# Patient Record
Sex: Male | Born: 1945 | Race: White | Hispanic: No | Marital: Married | Smoking: Light tobacco smoker
Health system: Southern US, Community
[De-identification: ages and names within clinical notes are randomized; demographics above are authoritative.]

## PROBLEM LIST (undated history)

## (undated) DIAGNOSIS — N200 Calculus of kidney: Secondary | ICD-10-CM

## (undated) DIAGNOSIS — I10 Essential (primary) hypertension: Secondary | ICD-10-CM

## (undated) DIAGNOSIS — E785 Hyperlipidemia, unspecified: Secondary | ICD-10-CM

## (undated) DIAGNOSIS — Z52000 Unspecified donor, whole blood: Secondary | ICD-10-CM

## (undated) DIAGNOSIS — N410 Acute prostatitis: Secondary | ICD-10-CM

## (undated) DIAGNOSIS — Z860100 Personal history of colon polyps, unspecified: Secondary | ICD-10-CM

## (undated) DIAGNOSIS — S83006A Unspecified dislocation of unspecified patella, initial encounter: Secondary | ICD-10-CM

## (undated) DIAGNOSIS — M1990 Unspecified osteoarthritis, unspecified site: Secondary | ICD-10-CM

## (undated) DIAGNOSIS — Z85828 Personal history of other malignant neoplasm of skin: Secondary | ICD-10-CM

## (undated) DIAGNOSIS — K649 Unspecified hemorrhoids: Secondary | ICD-10-CM

## (undated) DIAGNOSIS — F17200 Nicotine dependence, unspecified, uncomplicated: Secondary | ICD-10-CM

## (undated) DIAGNOSIS — C801 Malignant (primary) neoplasm, unspecified: Secondary | ICD-10-CM

## (undated) HISTORY — DX: Unspecified hemorrhoids: K64.9

## (undated) HISTORY — PX: POLYPECTOMY: SHX149

## (undated) HISTORY — DX: Personal history of colonic polyps: Z86.010

## (undated) HISTORY — DX: Essential (primary) hypertension: I10

## (undated) HISTORY — DX: Nicotine dependence, unspecified, uncomplicated: F17.200

## (undated) HISTORY — PX: MOHS SURGERY: SUR867

## (undated) HISTORY — PX: EYE SURGERY: SHX253

## (undated) HISTORY — PX: COLONOSCOPY: SHX174

## (undated) HISTORY — DX: Hyperlipidemia, unspecified: E78.5

## (undated) HISTORY — DX: Personal history of other malignant neoplasm of skin: Z85.828

## (undated) HISTORY — DX: Acute prostatitis: N41.0

## (undated) HISTORY — DX: Personal history of colon polyps, unspecified: Z86.0100

## (undated) HISTORY — DX: Unspecified osteoarthritis, unspecified site: M19.90

## (undated) HISTORY — PX: TONSILLECTOMY AND ADENOIDECTOMY: SUR1326

## (undated) HISTORY — DX: Unspecified donor, whole blood: Z52.000

---

## 2005-05-03 ENCOUNTER — Ambulatory Visit: Payer: Self-pay | Admitting: Pulmonary Disease

## 2005-05-09 ENCOUNTER — Ambulatory Visit: Payer: Self-pay | Admitting: Pulmonary Disease

## 2006-06-20 ENCOUNTER — Ambulatory Visit: Payer: Self-pay | Admitting: Pulmonary Disease

## 2006-07-13 ENCOUNTER — Ambulatory Visit: Payer: Self-pay | Admitting: Internal Medicine

## 2006-07-27 ENCOUNTER — Encounter (INDEPENDENT_AMBULATORY_CARE_PROVIDER_SITE_OTHER): Payer: Self-pay | Admitting: *Deleted

## 2006-07-27 ENCOUNTER — Ambulatory Visit: Payer: Self-pay | Admitting: Internal Medicine

## 2007-03-20 HISTORY — PX: OTHER SURGICAL HISTORY: SHX169

## 2008-02-20 ENCOUNTER — Telehealth (INDEPENDENT_AMBULATORY_CARE_PROVIDER_SITE_OTHER): Payer: Self-pay | Admitting: *Deleted

## 2008-05-28 ENCOUNTER — Ambulatory Visit: Payer: Self-pay | Admitting: Pulmonary Disease

## 2008-06-07 LAB — CONVERTED CEMR LAB
Albumin: 4 g/dL (ref 3.5–5.2)
BUN: 14 mg/dL (ref 6–23)
Basophils Absolute: 0 10*3/uL (ref 0.0–0.1)
Bilirubin, Direct: 0.1 mg/dL (ref 0.0–0.3)
Calcium: 9.6 mg/dL (ref 8.4–10.5)
Cholesterol: 216 mg/dL (ref 0–200)
Crystals: NEGATIVE
Eosinophils Absolute: 0.3 10*3/uL (ref 0.0–0.7)
GFR calc Af Amer: 111 mL/min
GFR calc non Af Amer: 91 mL/min
Glucose, Bld: 109 mg/dL — ABNORMAL HIGH (ref 70–99)
HCT: 45.7 % (ref 39.0–52.0)
HDL: 53 mg/dL (ref >39.0)
Hemoglobin, Urine: NEGATIVE
Leukocytes, UA: NEGATIVE
MCHC: 34.3 g/dL (ref 30.0–36.0)
MCV: 98 fL (ref 78.0–100.0)
Monocytes Absolute: 0.5 10*3/uL (ref 0.1–1.0)
Neutrophils Relative %: 54.6 % (ref 43.0–77.0)
PSA: 1.16 ng/mL (ref 0.10–4.00)
Platelets: 226 10*3/uL (ref 150–400)
Potassium: 4.6 meq/L (ref 3.5–5.1)
RBC / HPF: NONE SEEN
RDW: 12.1 % (ref 11.5–14.6)
Sodium: 142 meq/L (ref 135–145)
Specific Gravity, Urine: 1.02 (ref 1.000–1.03)
TSH: 1.13 microintl units/mL (ref 0.35–5.50)
Total Protein: 6.9 g/dL (ref 6.0–8.3)
Triglycerides: 43 mg/dL (ref 0–149)
Urine Glucose: NEGATIVE mg/dL
Urobilinogen, UA: 0.2 (ref 0.0–1.0)
WBC, UA: NONE SEEN cells/hpf
WBC: 6.7 10*3/uL (ref 4.5–10.5)

## 2008-06-19 HISTORY — PX: OTHER SURGICAL HISTORY: SHX169

## 2008-06-28 DIAGNOSIS — C439 Malignant melanoma of skin, unspecified: Secondary | ICD-10-CM | POA: Insufficient documentation

## 2008-06-28 DIAGNOSIS — K649 Unspecified hemorrhoids: Secondary | ICD-10-CM | POA: Insufficient documentation

## 2008-06-28 DIAGNOSIS — E7800 Pure hypercholesterolemia, unspecified: Secondary | ICD-10-CM | POA: Insufficient documentation

## 2008-06-28 DIAGNOSIS — M1990 Unspecified osteoarthritis, unspecified site: Secondary | ICD-10-CM | POA: Insufficient documentation

## 2008-06-28 DIAGNOSIS — F17200 Nicotine dependence, unspecified, uncomplicated: Secondary | ICD-10-CM | POA: Insufficient documentation

## 2008-06-28 DIAGNOSIS — D126 Benign neoplasm of colon, unspecified: Secondary | ICD-10-CM | POA: Insufficient documentation

## 2008-07-16 ENCOUNTER — Encounter: Payer: Self-pay | Admitting: Pulmonary Disease

## 2008-08-24 ENCOUNTER — Encounter: Payer: Self-pay | Admitting: Pulmonary Disease

## 2009-05-31 ENCOUNTER — Ambulatory Visit: Payer: Self-pay | Admitting: Pulmonary Disease

## 2009-06-04 LAB — CONVERTED CEMR LAB
ALT: 22 units/L (ref 0–53)
BUN: 14 mg/dL (ref 6–23)
Basophils Absolute: 0.1 10*3/uL (ref 0.0–0.1)
Chloride: 106 meq/L (ref 96–112)
Cholesterol: 222 mg/dL — ABNORMAL HIGH (ref 0–200)
Creatinine, Ser: 0.8 mg/dL (ref 0.4–1.5)
Eosinophils Absolute: 0.3 10*3/uL (ref 0.0–0.7)
Eosinophils Relative: 4 % (ref 0.0–5.0)
Glucose, Bld: 100 mg/dL — ABNORMAL HIGH (ref 70–99)
HCT: 48.8 % (ref 39.0–52.0)
Ketones, ur: NEGATIVE mg/dL
Leukocytes, UA: NEGATIVE
Lymphs Abs: 2 10*3/uL (ref 0.7–4.0)
MCHC: 34.1 g/dL (ref 30.0–36.0)
MCV: 99.6 fL (ref 78.0–100.0)
Monocytes Absolute: 0.4 10*3/uL (ref 0.1–1.0)
Nitrite: NEGATIVE
PSA: 1.26 ng/mL (ref 0.10–4.00)
Platelets: 214 10*3/uL (ref 150.0–400.0)
Potassium: 4.5 meq/L (ref 3.5–5.1)
RDW: 11.9 % (ref 11.5–14.6)
Specific Gravity, Urine: 1.02 (ref 1.000–1.030)
TSH: 1.33 microintl units/mL (ref 0.35–5.50)
Total Bilirubin: 1.3 mg/dL — ABNORMAL HIGH (ref 0.3–1.2)
Urobilinogen, UA: 0.2 (ref 0.0–1.0)

## 2009-06-09 ENCOUNTER — Telehealth: Payer: Self-pay | Admitting: Pulmonary Disease

## 2009-09-08 ENCOUNTER — Telehealth: Payer: Self-pay | Admitting: Pulmonary Disease

## 2009-09-09 ENCOUNTER — Ambulatory Visit: Payer: Self-pay | Admitting: Pulmonary Disease

## 2009-09-19 LAB — CONVERTED CEMR LAB
Albumin: 3.8 g/dL (ref 3.5–5.2)
Bilirubin, Direct: 0.1 mg/dL (ref 0.0–0.3)
Cholesterol: 141 mg/dL (ref 0–200)
LDL Cholesterol: 75 mg/dL (ref 0–99)
Total Protein: 6.6 g/dL (ref 6.0–8.3)
Triglycerides: 36 mg/dL (ref 0.0–149.0)
VLDL: 7.2 mg/dL (ref 0.0–40.0)

## 2009-09-27 ENCOUNTER — Telehealth: Payer: Self-pay | Admitting: Pulmonary Disease

## 2010-07-19 NOTE — Progress Notes (Signed)
Summary: RESULTS  Phone Note Call from Patient   Caller: Patient Call For: Annaleigh Steinmeyer Summary of Call: CALLING FOR LAB RESULTS NEED CHOLESTEROL READING LEAVE MASSAGE IF NO ANSWER Initial call taken by: Rickard Patience,  September 27, 2009 9:44 AM  Follow-up for Phone Call        Looks like FLP were good and SN wants pt to stay on current dose of Simva. LMOMTCB. Per Leigh, will forward msg to her box.Michel Bickers [facility]  September 27, 2009 10:02 AM   per pt--ok to leave message on machine if no answer---per SN---flp looks good on the simvastatin 40mg   daily---keep the same---lmom to make pt aware of lab results and SN recs.  explained to call for any questions. Randell Loop CMA  September 27, 2009 2:22 PM

## 2010-07-19 NOTE — Progress Notes (Signed)
Summary: labs  Phone Note Call from Patient Call back at [phone]   Caller: Patient Call For: nadel Summary of Call: need labs put in to check cholestrol Initial call taken by: Rickard Patience,  September 08, 2009 11:18 AM  Follow-up for Phone Call        Pt last had labs done in Dec 2010.  Was started on simvastatin.  Please advise if SN needs pt to come in for labs. Thanks  Follow-up by: Vernie Murders,  September 08, 2009 11:33 AM  Additional Follow-up for Phone Call Additional follow up Details #1::        called and spoke with pt and he will come to the lab in the am for recheck chol labs after starting on simvastatin 40mg  daily. Randell Loop CMA  September 08, 2009 11:39 AM

## 2010-08-17 ENCOUNTER — Telehealth (INDEPENDENT_AMBULATORY_CARE_PROVIDER_SITE_OTHER): Payer: Self-pay | Admitting: *Deleted

## 2010-08-22 ENCOUNTER — Encounter: Payer: Self-pay | Admitting: Pulmonary Disease

## 2010-08-22 ENCOUNTER — Ambulatory Visit (INDEPENDENT_AMBULATORY_CARE_PROVIDER_SITE_OTHER)
Admission: RE | Admit: 2010-08-22 | Discharge: 2010-08-22 | Disposition: A | Discharge status: Home / Self Care | Payer: BC Managed Care – PPO | Source: Ambulatory Visit | Attending: Pulmonary Disease | Admitting: Pulmonary Disease

## 2010-08-22 ENCOUNTER — Encounter (INDEPENDENT_AMBULATORY_CARE_PROVIDER_SITE_OTHER): Payer: BC Managed Care – PPO | Admitting: Pulmonary Disease

## 2010-08-22 ENCOUNTER — Other Ambulatory Visit: Payer: Self-pay | Admitting: Pulmonary Disease

## 2010-08-22 ENCOUNTER — Other Ambulatory Visit: Payer: BC Managed Care – PPO

## 2010-08-22 DIAGNOSIS — Z0000 Encounter for general adult medical examination without abnormal findings: Secondary | ICD-10-CM

## 2010-08-22 LAB — CBC WITH DIFFERENTIAL/PLATELET
Basophils Absolute: 0 10*3/uL (ref 0.0–0.1)
Eosinophils Relative: 4.3 % (ref 0.0–5.0)
HCT: 46.3 % (ref 39.0–52.0)
Hemoglobin: 16.1 g/dL (ref 13.0–17.0)
Lymphocytes Relative: 32.9 % (ref 12.0–46.0)
Lymphs Abs: 2.4 10*3/uL (ref 0.7–4.0)
Monocytes Relative: 6.4 % (ref 3.0–12.0)
Neutro Abs: 4.1 10*3/uL (ref 1.4–7.7)
WBC: 7.4 10*3/uL (ref 4.5–10.5)

## 2010-08-22 LAB — URINALYSIS, ROUTINE W REFLEX MICROSCOPIC
Bilirubin Urine: NEGATIVE
Ketones, ur: NEGATIVE
Leukocytes, UA: NEGATIVE
Specific Gravity, Urine: 1.02 (ref 1.000–1.030)
Urine Glucose: NEGATIVE
pH: 6 (ref 5.0–8.0)

## 2010-08-22 LAB — PSA: PSA: 1.23 ng/mL (ref 0.10–4.00)

## 2010-08-22 LAB — LIPID PANEL
Cholesterol: 157 mg/dL (ref 0–200)
HDL: 54.2 mg/dL (ref >39.00)
LDL Cholesterol: 92 mg/dL (ref 0–99)
VLDL: 10.8 mg/dL (ref 0.0–40.0)

## 2010-08-22 LAB — BASIC METABOLIC PANEL
Chloride: 103 mEq/L (ref 96–112)
Potassium: 4.4 mEq/L (ref 3.5–5.1)

## 2010-08-22 LAB — TSH: TSH: 0.81 u[IU]/mL (ref 0.35–5.50)

## 2010-08-25 NOTE — Progress Notes (Signed)
Summary: simvastatin rx sent but keep appt with SN  Phone Note Call from Patient   Caller: Patient Call For: NADEL Summary of Call: Patient phoned stated that he called and requested a refill for his Simvastain and it was denied due needing an appt. I scheduled Mr.  Taylors physical for 08/22/10 at 11:00. he is completely out and would like to know if he can have a prescription called in to last until his appt. Patient uses [facility] and patient can be reached at work [phone] Initial call taken by: Vedia Coffer,  August 17, 2010 10:54 AM  Follow-up for Phone Call        ATC pt at given number to let him know to KEEP appt and I have sent 1 refill for Simvastatin Rx.Reynaldo Minium CMA  August 17, 2010 12:19 PM   Called, spoke with pt.  He was informed 1 rx sent to pharmacy but he needs to keep scheduled appt on 08/22/10 at 11am with SN for additional rxs.  He verbalized understanding of this. Follow-up by: Gweneth Dimitri RN,  August 17, 2010 1:39 PM    Prescriptions: SIMVASTATIN 40 MG TABS (SIMVASTATIN) Take 1 tablet by mouth once a day  #30 Tablet x 0   Entered by:   Reynaldo Minium CMA   Authorized by:   Michele Mcalpine MD   Signed by:   Reynaldo Minium CMA on 08/17/2010   Method used:   Electronically to        [facility] (retail)       [address]       [place], [place]  [id number]       Ph: [phone] or [phone]       Fax: [phone]   RxID:   [id number]

## 2010-09-06 NOTE — Assessment & Plan Note (Signed)
Summary: CPX//SH   CC:  14 month ROV & CPX....  History of Present Illness: 65 y/o WM here for a follow up visit...    ~  May 31, 2009:  yearly physical- doing well, no complaints... 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   ~  August 22, 2010:  Yearly follow up- doing well overall notes some left knee pain (he works out in gym 5d/wk +golf etc & uses Advil Prn), occas nasal drainage & epistaxis (wife made appt w/ ENT later this week- we discussed saline)...    On Simva40 for Chol & FLP 3/12 shows TChol 157, TG 54, HDL 54, LDL 92> continue same +diet etc...    EKG= SBrady 54/min, WNL.Marland KitchenMarland Kitchen CPX labs all look good (he didn't go for his XRay today)...    Current Problems:   PHYSICAL EXAMINATION (ICD-V70.0) - he takes ASA 81mg  on MWF since he has epistaxis on daily ASA early in 2009... he had a tetanus shot here in 2006.  SMOKER (ICD-305.1) - he is a cigar smoker and states that he does not inhale... smokers only occas "when I play golf or go to the bar"...  HYPERCHOLESTEROLEMIA, BORDERLINE (ICD-272.4) - on diet Rx alone & prev not interested in med Rx... now he states he will consider medication if no better on his diet...  ~  FLP 11/06 showed TChol 210, Tg 41, HDL 59, LDL 138... pt refers diet Rx.  ~  FLP 12/09 showed TChol 219, Tg 43, HDL 53, LDL 141... rec> start Simva but he refuses, prefers diet.  ~  FLP 12/10 showed TChol  222, TG 111, HDL 53, LDL 153... rec> start Simva40.  ~  FLP 3/11 on Simva40 showed TChol 141, TG 36, HDL 59, LDL 75... continue Simva40.  Hx of COLONIC POLYPS (ICD-211.3), & HEMORRHOIDS (ICD-455.6) - + FamHx colon cancer in his grandfather... last colonoscopy 2/08 by DrPerry showed 3mm polyp & hems... path= polypoid mucosa w/o adenomatous change... f/u planned 75yrs.  Hx of ACUTE  PROSTATITIS (ICD-601.0) - prev eval & Rx by DrWrenn... no symptoms...  DEGENERATIVE JOINT DISEASE (ICD-715.90) - he had a prev left knee arthroscopy in 2008... he take 3 ADVIL prior to golfing...  SKIN CANCER, HX OF (ICD-V10.83) - he sees DrHall for skin check every 6 months... 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  ~  labs 12/09 showed Hg= 15.7.Marland Kitchen.  ~  labs 12/10 showed Hg= 16.6   Preventive Screening-Counseling & Management  Alcohol-Tobacco     Smoking Status: current  Comments: smokes 1 cigar weekly  Allergies (verified): No Known Drug Allergies  Comments:  Nurse/Medical Assistant: The patient's medications and allergies were reviewed with the patient and were updated in the Medication and Allergy Lists.  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 Father died w/ COPD Mother died w/ Alzheimer's disease & stroke 1 Sibling- sister in good health Grandfather had colon cancer  Social History: Reviewed history from 05/28/2008 and no changes required. 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  The patient denies fever, chills, sweats, anorexia, fatigue, weakness, malaise, weight loss, sleep disorder, blurring, diplopia, eye irritation, eye discharge, vision loss, eye pain, photophobia,  earache, ear discharge, tinnitus, decreased hearing, nasal congestion, nosebleeds, sore throat, hoarseness, chest pain, palpitations, syncope, dyspnea on exertion, orthopnea, PND, peripheral edema, cough, dyspnea at rest, excessive sputum, hemoptysis, wheezing, pleurisy, nausea, vomiting, diarrhea, constipation, change in bowel habits, abdominal pain, melena, hematochezia, jaundice, gas/bloating, indigestion/heartburn, dysphagia, odynophagia, dysuria, hematuria, urinary frequency, urinary hesitancy, nocturia, incontinence, back pain, joint swelling, muscle cramps, muscle weakness, stiffness, sciatica, restless legs, leg pain at night, leg pain with exertion, rash, itching, dryness, suspicious lesions, paralysis, paresthesias, seizures, tremors, vertigo, transient blindness, frequent falls, frequent headaches, difficulty walking, depression, anxiety, memory loss, confusion, cold intolerance, heat intolerance, polydipsia, polyphagia, polyuria, unusual weight change, abnormal bruising, bleeding, enlarged lymph nodes, urticaria, allergic rash, hay fever, and recurrent infections.    Vital Signs:  Patient profile:   65 year old male Height:      72 inches Weight:      197 pounds BMI:     26.81 O2 Sat:      97 % on Room air Temp:     97.0 degrees F oral Pulse rate:   54 / minute BP sitting:   130 / 80  (left arm) Cuff size:   regular  O2 Sat at Rest %:  97 O2 Flow:  Room air CC: 14 month ROV & CPX... Is Patient Diabetic? No Pain Assessment Patient in pain? yes      Onset of pain  joint pain/left knee pain Comments meds updated today with pt   Physical Exam  Additional Exam:  WD, WN, 65 y/o WM in NAD...  GENERAL:  Alert & oriented; pleasant & cooperative... HEENT:  [place]/AT, EOM-wnl, PERRLA, EACs-clear, TMs-wnl, NOSE-clear, THROAT-clear & wnl. NECK:  Supple w/ full ROM; no JVD; normal carotid impulses w/o bruits; no thyromegaly or nodules palpated; no lymphadenopathy. CHEST:  Clear to P & A;  without wheezes/ rales/ or rhonchi. HEART:  Regular Rhythm; without murmurs/ rubs/ or gallops. ABDOMEN:  Soft & nontender; normal bowel sounds; no organomegaly or masses detected. RECTAL:  Neg - prostate 2+ & nontender w/o nodules; stool hematest neg. EXT: without deformities, mild arthritic changes; no varicose veins/ venous insuffic/ or edema. NEURO:  CN's intact; motor testing normal; sensory testing normal; gait normal & balance OK. DERM:  No lesions noted; no rash etc...    Impression & Recommendations:  Problem # 1:  PHYSICAL EXAMINATION (ICD-V70.0)  Orders: EKG w/ Interpretation (93000) T-2 View CXR (71020TC) TLB-BMP (Basic Metabolic Panel-BMET) (80048-METABOL) TLB-CBC Platelet - w/Differential (85025-CBCD) TLB-Lipid Panel (80061-LIPID) TLB-TSH (Thyroid Stimulating Hormone) (84443-TSH) TLB-PSA (Prostate Specific Antigen) (84153-PSA) TLB-Udip w/ Micro (81001-URINE)  Problem # 2:  SMOKER (ICD-305.1) We discussed stopping the cigars etc "I don't inhale"...  Problem # 3:  HYPERCHOLESTEROLEMIA, BORDERLINE (ICD-272.4) Much improved on the Simva40>  continue same... His updated medication list for this problem includes:    Simvastatin 40 Mg Tabs (Simvastatin) .Marland Kitchen... Take 1 tablet by mouth once a day  Problem # 4:  Hx of COLONIC POLYPS (ICD-211.3) Up to date, no symptoms, f/u  due 2/15...  Problem # 5:  DEGENERATIVE JOINT DISEASE (ICD-715.90) He uses Advil for his knee... advised brace for work outs... Ortho Prn. His updated medication list for this problem includes:    Aspirin Adult Low Strength 81 Mg Tbec (Aspirin) .Marland Kitchen... Take 1 tablet by mouth 3 times per week  Problem # 6:  OTHER MEDICAL PROBLEMS AS NOTED>>> He did not go to Radiology for his f/u CXR this yr...  Complete Medication List: 1)  Aspirin Adult Low Strength 81 Mg Tbec (Aspirin) .... Take 1 tablet by mouth 3 times per week 2)  Multivitamins Tabs (Multiple vitamin) .... Take 1 tablet by mouth once a day 3)   Simvastatin 40 Mg Tabs (Simvastatin) .... Take 1 tablet by mouth once a day  Patient Instructions: 1)  Today we updated your med list- see below.... 2)  We refilled your simvastatin for 2012... 3)  Today we did your follow up CXR EKG, & FASTING blood work... 4)  please call the "phone tree" in a few days for your lab results.Marland KitchenMarland Kitchen  5)  Keep up the great job w/ your exercise program... 6)  Call for any problems.Marland KitchenMarland Kitchen 7)  Please schedule a follow-up appointment in 1 year. Prescriptions: SIMVASTATIN 40 MG TABS (SIMVASTATIN) Take 1 tablet by mouth once a day  #90 x 4   Entered and Authorized by:   Michele Mcalpine MD   Signed by:   Michele Mcalpine MD on 08/22/2010   Method used:   Print then Give to Patient   RxID:   [id number]    Immunization History:  Influenza Immunization History:    Influenza:  historical (02/25/2010)

## 2011-01-16 ENCOUNTER — Emergency Department (HOSPITAL_COMMUNITY)
Admission: EM | Admit: 2011-01-16 | Discharge: 2011-01-16 | Disposition: A | Discharge status: Home / Self Care | Payer: BC Managed Care – PPO | Attending: Emergency Medicine | Admitting: Emergency Medicine

## 2011-01-16 ENCOUNTER — Telehealth: Payer: Self-pay | Admitting: Pulmonary Disease

## 2011-01-16 ENCOUNTER — Emergency Department (HOSPITAL_COMMUNITY): Payer: BC Managed Care – PPO

## 2011-01-16 DIAGNOSIS — E785 Hyperlipidemia, unspecified: Secondary | ICD-10-CM | POA: Insufficient documentation

## 2011-01-16 DIAGNOSIS — R109 Unspecified abdominal pain: Secondary | ICD-10-CM | POA: Insufficient documentation

## 2011-01-16 DIAGNOSIS — N201 Calculus of ureter: Secondary | ICD-10-CM | POA: Insufficient documentation

## 2011-01-16 LAB — URINALYSIS, ROUTINE W REFLEX MICROSCOPIC
Glucose, UA: NEGATIVE mg/dL
Ketones, ur: NEGATIVE mg/dL
Leukocytes, UA: NEGATIVE
Nitrite: NEGATIVE
Specific Gravity, Urine: 1.029 (ref 1.005–1.030)
pH: 5.5 (ref 5.0–8.0)

## 2011-01-16 NOTE — Telephone Encounter (Signed)
Called spoke with patient, advised of SN's recs.  Pt verbalized his understanding.

## 2011-01-16 NOTE — Telephone Encounter (Signed)
Per SN-----he will need prompt eval --we have no openings today---he will need to go to the ER for eval of the abd pain.  They can do all the xrays and labs there and treat his pain.  thanks

## 2011-01-16 NOTE — Telephone Encounter (Signed)
Called and spoke with pt.  Pt states he woke up this morning at 2am with lower R sided abd pain.  Pt states the pain is sharp and constant. States the pain doesn't radiate.  Denies any vomiting but does c/o nausea.  Also denies f/c/s.  Pt is concerned and  requesting to be seen today with SN.  Please advise.  Thanks.  NKDA

## 2011-01-16 NOTE — Telephone Encounter (Signed)
Pt also denies diarrhea or constipation.

## 2011-01-22 ENCOUNTER — Emergency Department (HOSPITAL_COMMUNITY): Payer: BC Managed Care – PPO

## 2011-01-22 ENCOUNTER — Emergency Department (HOSPITAL_COMMUNITY)
Admission: EM | Admit: 2011-01-22 | Discharge: 2011-01-22 | Disposition: A | Discharge status: Home / Self Care | Payer: BC Managed Care – PPO | Attending: Emergency Medicine | Admitting: Emergency Medicine

## 2011-01-22 DIAGNOSIS — R109 Unspecified abdominal pain: Secondary | ICD-10-CM | POA: Insufficient documentation

## 2011-01-22 DIAGNOSIS — N200 Calculus of kidney: Secondary | ICD-10-CM | POA: Insufficient documentation

## 2011-01-22 DIAGNOSIS — K5900 Constipation, unspecified: Secondary | ICD-10-CM | POA: Insufficient documentation

## 2011-01-22 LAB — POCT I-STAT, CHEM 8
Creatinine, Ser: 1.2 mg/dL (ref 0.50–1.35)
Glucose, Bld: 115 mg/dL — ABNORMAL HIGH (ref 70–99)
Hemoglobin: 15.3 g/dL (ref 13.0–17.0)
Sodium: 140 mEq/L (ref 135–145)
TCO2: 25 mmol/L (ref 0–100)

## 2011-01-22 LAB — URINALYSIS, ROUTINE W REFLEX MICROSCOPIC
Bilirubin Urine: NEGATIVE
Protein, ur: NEGATIVE mg/dL
Urobilinogen, UA: 1 mg/dL (ref 0.0–1.0)

## 2012-01-31 ENCOUNTER — Other Ambulatory Visit: Payer: Self-pay | Admitting: Pulmonary Disease

## 2012-01-31 ENCOUNTER — Telehealth: Payer: Self-pay | Admitting: Pulmonary Disease

## 2012-01-31 ENCOUNTER — Other Ambulatory Visit: Payer: Self-pay | Admitting: *Deleted

## 2012-01-31 MED ORDER — SIMVASTATIN 40 MG PO TABS: ORAL_TABLET | ORAL | Status: DC

## 2012-01-31 NOTE — Telephone Encounter (Signed)
Faxed refill request received from [facility] for Simvastatin 40 mg take one tablet by mouth once daily. Patient last seen March 2012. Refill sent in and noted that patient needs appointment for further refills.

## 2012-01-31 NOTE — Progress Notes (Signed)
Pts Rx was refilled and printed instead of faxed in. I contacted pt to ask him what pharm he used seeing as to how we had none on file for him.  Rx phoned-in to [facility].  Simvastatin 40mg  #30 x 0RF  1 po qd.  (PATIENT NEEDS APPOINTMENT FOR FURTHER REFILLS)

## 2012-01-31 NOTE — Telephone Encounter (Signed)
I spoke with pt and he stated he has already received a call from another nurse stating the rx was being taking care of and he did not need anything further

## 2012-02-21 ENCOUNTER — Telehealth: Payer: Self-pay | Admitting: Pulmonary Disease

## 2012-02-21 MED ORDER — SIMVASTATIN 40 MG PO TABS: ORAL_TABLET | ORAL | Status: DC

## 2012-02-21 NOTE — Telephone Encounter (Signed)
I spoke with the pt and he will run out of simvastatin before appt with SN on 03-19-12 so he is requesting a refill be sent to [facility]. Refill sent. Pt aware must keep appt. Carron Curie, CMA

## 2012-03-11 ENCOUNTER — Encounter: Payer: BC Managed Care – PPO | Admitting: Pulmonary Disease

## 2012-03-19 ENCOUNTER — Encounter: Payer: Self-pay | Admitting: Pulmonary Disease

## 2012-03-19 ENCOUNTER — Ambulatory Visit (INDEPENDENT_AMBULATORY_CARE_PROVIDER_SITE_OTHER)
Admission: RE | Admit: 2012-03-19 | Discharge: 2012-03-19 | Disposition: A | Discharge status: Home / Self Care | Payer: BC Managed Care – PPO | Source: Ambulatory Visit | Attending: Pulmonary Disease | Admitting: Pulmonary Disease

## 2012-03-19 ENCOUNTER — Ambulatory Visit (INDEPENDENT_AMBULATORY_CARE_PROVIDER_SITE_OTHER): Payer: BC Managed Care – PPO | Admitting: Pulmonary Disease

## 2012-03-19 ENCOUNTER — Other Ambulatory Visit (INDEPENDENT_AMBULATORY_CARE_PROVIDER_SITE_OTHER): Payer: BC Managed Care – PPO

## 2012-03-19 VITALS — BP 160/88 | HR 52 | Temp 97.0°F | Ht 72.0 in | Wt 187.2 lb

## 2012-03-19 DIAGNOSIS — M1990 Unspecified osteoarthritis, unspecified site: Secondary | ICD-10-CM

## 2012-03-19 DIAGNOSIS — E785 Hyperlipidemia, unspecified: Secondary | ICD-10-CM

## 2012-03-19 DIAGNOSIS — Z85828 Personal history of other malignant neoplasm of skin: Secondary | ICD-10-CM

## 2012-03-19 DIAGNOSIS — D126 Benign neoplasm of colon, unspecified: Secondary | ICD-10-CM

## 2012-03-19 DIAGNOSIS — N200 Calculus of kidney: Secondary | ICD-10-CM

## 2012-03-19 DIAGNOSIS — Z0000 Encounter for general adult medical examination without abnormal findings: Secondary | ICD-10-CM

## 2012-03-19 LAB — CBC WITH DIFFERENTIAL/PLATELET
Basophils Absolute: 0 10*3/uL (ref 0.0–0.1)
HCT: 45.8 % (ref 39.0–52.0)
Hemoglobin: 15 g/dL (ref 13.0–17.0)
Lymphs Abs: 2.4 10*3/uL (ref 0.7–4.0)
MCHC: 32.8 g/dL (ref 30.0–36.0)
Monocytes Relative: 6.1 % (ref 3.0–12.0)
Neutro Abs: 4.4 10*3/uL (ref 1.4–7.7)
RDW: 13.3 % (ref 11.5–14.6)

## 2012-03-19 LAB — URINALYSIS
Ketones, ur: NEGATIVE
Specific Gravity, Urine: >=1.03 (ref 1.000–1.030)
Total Protein, Urine: NEGATIVE
Urine Glucose: NEGATIVE
Urobilinogen, UA: 0.2 (ref 0.0–1.0)

## 2012-03-19 LAB — BASIC METABOLIC PANEL
Calcium: 9.4 mg/dL (ref 8.4–10.5)
GFR: 122.65 mL/min (ref >60.00)
Sodium: 139 mEq/L (ref 135–145)

## 2012-03-19 LAB — HEPATIC FUNCTION PANEL
Albumin: 4.1 g/dL (ref 3.5–5.2)
Alkaline Phosphatase: 65 U/L (ref 39–117)
Bilirubin, Direct: 0.2 mg/dL (ref 0.0–0.3)

## 2012-03-19 LAB — LIPID PANEL
HDL: 73.1 mg/dL (ref >39.00)
Total CHOL/HDL Ratio: 2
VLDL: 7 mg/dL (ref 0.0–40.0)

## 2012-03-19 MED ORDER — SIMVASTATIN 40 MG PO TABS: ORAL_TABLET | ORAL | Status: DC

## 2012-03-19 NOTE — Patient Instructions (Addendum)
Today we updated your med list in our EPIC system...    Continue your current medications the same...    We refilled your meds per request...  Today we did your follow up CXR & FASTING blood work...    We will post the results to "My Chart"...  Call for any questions...  Let's plan a routine follow up visit in 1 yr, sooner if needed for any problems.Marland KitchenMarland Kitchen

## 2012-03-20 ENCOUNTER — Encounter: Payer: Self-pay | Admitting: Pulmonary Disease

## 2012-03-20 NOTE — Progress Notes (Signed)
Subjective:     Patient ID: Russell [name], male   DOB: [birth year], 66 y.o.   MRN: [id number]  HPI 66 y/o WM here for a follow up visit...   ~  May 31, 2009:  yearly physical- doing well, no complaints... 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  ~  August 22, 2010:  Yearly follow up- doing well overall notes some left knee pain (he works out in gym 5d/wk +golf etc & uses Advil Prn), occas nasal drainage & epistaxis (wife made appt w/ ENT later this week- we discussed saline)...    On Simva40 for Chol & FLP 3/12 shows TChol 157, TG 54, HDL 54, LDL 92> continue same +diet etc...    EKG= SBrady 54/min, WNL.Marland KitchenMarland Kitchen CPX labs all look good (he didn't go for his XRay today)...  ~  March 19, 2012:  19 month ROV & CPX> Kellee has had a good interval except for the right kidney stone 7-8/12, treated via ER & was able to pass, then seen by Urology (per pt hx we do not have notes & nothing in Epic), sounds like calc oxalate stones w/ rec for dietary adjust- no colas, tea, etc & increase citrate; he has been doing well since then & no known recurrence...    His only meds are ASA81 and Simva40> tol well & FLP today is good- all parameters at goals, continue same...  His BP is sl elev today at 160/88 but he's had a stressful morn, & BP's at his last two blood donor appts= 110-120/ 60-70's;  He exercises regularly w/o difficulty...    We reviewed prob list, meds, xrays and labs> see below for updates >> he'll get flu vaccine next week... CXR 10/13 showed normal heart size, clear lungs, tiny granuloma left base, NAD... LABS 10/13:  FLP- at goals on Simva40;  Chems- wnl;  CBC- wnl;  TSH=0.83;  PSA= 1.23;  Urine- clear    Problem List:   SMOKER (ICD-305.1) - he is a cigar smoker and states that he does not inhale... smokers  only occas "when I play golf or go to the bar"... ~  CXR 3/12 showed norm heart size, mild interstitial thickening, 3mm calc granuloma left lung base. ~  CTAbd 7/12 in ER for kidney stone showed incidental mild bullous changes at lung bases & bibasilar atx; also atherosclerotic changes in Ao & branches w/o aneurysm.  ~  CXR 10/13 showed tiny calcif granuloma at left lung base, norm heart size, otherw clear lungs...  HYPERCHOLESTEROLEMIA, BORDERLINE (ICD-272.4) - on diet Rx alone & prev not interested in med Rx... now he states he will consider medication if no better on his diet... ~  FLP 11/06 showed TChol 210, Tg 41, HDL 59, LDL 138... pt refers diet Rx. ~  FLP 12/09 showed TChol 219, Tg 43, HDL 53, LDL 141... rec> start Simva but he refuses, prefers diet. ~  FLP 12/10 showed TChol  222, TG 111, HDL 53, LDL 153... rec> start Simva40. ~  FLP 3/11 on Simva40 showed TChol 141, TG 36, HDL 59, LDL 75... continue Simva40. ~  FLP 3/12 on Simva40 showed TChol 157, TG 54, HDL 54, LDL 92 ~  FLP 10/13 on Simva40 showed TChol 163, TG 35, HDL 73, LDL 83  Hx of COLONIC  POLYPS (ICD-211.3), & HEMORRHOIDS (ICD-455.6) - + FamHx colon cancer in his grandfather... last colonoscopy 2/08 by DrPerry showed 3mm polyp & hems... path= polypoid mucosa w/o adenomatous change... f/u planned 30yrs.  Hx of ACUTE PROSTATITIS (ICD-601.0) - prev eval & Rx by DrWrenn... no symptoms...  KIDNEY STONES >>  ~  New onset problem 7/12 & went to ER for treatment; Rx conservatively & he was able to pass stone; followed up w/ Urology, DrOttelin (we don't have notes) & he reports stone was calc oxalate & rec to avoid colas, tea, etc; increase water intake & use citrate when poss... 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 ~  XRay Abd in  ER 8/12 showed mod stool burden, mild lumbar spondylosis, NAD...  DEGENERATIVE JOINT DISEASE (ICD-715.90) - he had a prev left knee arthroscopy by DrYates in 2008... he take 3 ADVIL prior to golfing...  SKIN CANCER, HX OF (ICD-V10.83) - he sees DrHall for skin check every 6 months... 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 ~  labs 12/09 showed Hg= 15.7 ~  labs 12/10 showed Hg= 16.6 ~  Labs 3/12 showed Hg= 16.1 ~  Labs 10/13 showed Hg= 15.0  HEALTH MAINTENANCE:  he takes ASA 81mg  on MWF since he has epistaxis on daily ASA early in 2009... ~  GI:  DrPerry & up to date on screening colonoscopy... ~  GU:   ~  Immuniz:  He gets the yearly flu vaccine... Tetanus shot given in 2006...  Pneumovax due at age 71.   Past Medical History  Diagnosis Date  . Smoker   . Other and unspecified hyperlipidemia   . Hx of colonic polyps   . Hemorrhoids   . Acute prostatitis   . DJD (degenerative joint disease)   . History of skin cancer   . Whole blood donor     Past Surgical History  Procedure Date  . Tonsillectomy and adenoidectomy   . Skin cancer removed   . Left knee arthroscopy 03/2007  . Laser eye surgery for corneal scar right eye 2010    Dr. Lisette Grinder at [facility] Encounter Prescriptions as of 03/19/2012  Medication Sig Dispense Refill  . aspirin 81 MG tablet Take 81 mg by mouth daily.      . Multiple Vitamins-Minerals (MULTIVITAMIN & MINERAL PO) Take 1 tablet by mouth daily.      . simvastatin (ZOCOR) 40 MG tablet Take one tablet by mouth once daily  90 tablet  3  . DISCONTD: simvastatin (ZOCOR) 40 MG tablet Take one tablet by mouth once daily  30 tablet  0    No Known Allergies   Current Medications, Allergies, Past Medical History, Past Surgical History, Family History, and Social History were reviewed in [employer]  record.   Review of Systems         The patient complains of joint pain and arthritis.  The patient denies fever, chills, sweats, anorexia, fatigue, weakness, malaise, weight loss, sleep disorder, blurring, diplopia, eye irritation, eye discharge, vision loss, eye pain, photophobia, earache, ear discharge, tinnitus, decreased hearing, nasal congestion, nosebleeds, sore throat, hoarseness, chest pain, palpitations, syncope, dyspnea on exertion, orthopnea, PND, peripheral  edema, cough, dyspnea at rest, excessive sputum, hemoptysis, wheezing, pleurisy, nausea, vomiting, diarrhea, constipation, change in bowel habits, abdominal pain, melena, hematochezia, jaundice, gas/bloating, indigestion/heartburn, dysphagia, odynophagia, dysuria, hematuria, urinary frequency, urinary hesitancy, nocturia, incontinence, back pain, joint swelling, muscle cramps, muscle weakness, stiffness, sciatica, restless legs, leg pain at night, leg pain with exertion, rash, itching, dryness, suspicious lesions, paralysis, paresthesias, seizures, tremors, vertigo, transient blindness, frequent falls, frequent headaches, difficulty walking, depression, anxiety, memory loss, confusion, cold intolerance, heat intolerance, polydipsia, polyphagia, polyuria, unusual weight change, abnormal bruising, bleeding, enlarged lymph nodes, urticaria, allergic rash, hay fever, and recurrent infections.     Objective:   Physical Exam    WD, WN, 66 y/o WM in NAD...  GENERAL:  Alert & oriented; pleasant & cooperative... HEENT:  [place]/AT, EOM-wnl, PERRLA, EACs-clear, TMs-wnl, NOSE-clear, THROAT-clear & wnl. NECK:  Supple w/ full ROM; no JVD; normal carotid impulses w/o bruits; no thyromegaly or nodules palpated; no lymphadenopathy. CHEST:  Clear to P & A; without wheezes/ rales/ or rhonchi. HEART:  Regular Rhythm; without murmurs/ rubs/ or gallops. ABDOMEN:  Soft & nontender; normal bowel sounds; no organomegaly or masses detected. RECTAL:  Neg -  prostate 2+ & nontender w/o nodules; stool hematest neg. EXT: without deformities, mild arthritic changes; no varicose veins/ venous insuffic/ or edema. NEURO:  CN's intact; motor testing normal; sensory testing normal; gait normal & balance OK. DERM:  No lesions noted; no rash etc...  RADIOLOGY DATA:  Reviewed in the EPIC EMR & discussed w/ the patient...  LABORATORY DATA:  Reviewed in the EPIC EMR & discussed w/ the patient...   Assessment:     CPX >>  SMOKER>  CXR is stable, NAD; CTAbd in 2012 showed incidental bullous changes at lung bases; must quit all smoking...  CHOL>  FLP looks good on Simva40; continue same + diet...  Hx Colon Polyps>  followed by DrPerry & f/u colon due 2015...  Hx Kid Stones & Prostatitis in past>  No recurrent kid stones or prostate symptoms; PSA= 1.23  DJD>  Followed by Criss Alvine; doing satis on Advil Rx...  Skin Ca>  Followed by Ginette Pitman & pt reports doing OK...   Whole Blood Donor>  A+ blood type & has given ~150 pints to date...     Plan:     Patient's Medications  New Prescriptions   No medications on file  Previous Medications   ASPIRIN 81 MG TABLET    Take 81 mg by mouth daily.   MULTIPLE VITAMINS-MINERALS (MULTIVITAMIN & MINERAL PO)    Take 1 tablet by mouth daily.  Modified Medications   Modified Medication Previous Medication   SIMVASTATIN (ZOCOR) 40 MG TABLET simvastatin (ZOCOR) 40 MG tablet      Take one tablet by mouth once daily    Take one tablet by mouth once daily  Discontinued Medications   No medications on file

## 2012-07-19 ENCOUNTER — Telehealth: Payer: Self-pay | Admitting: Pulmonary Disease

## 2012-07-19 NOTE — Telephone Encounter (Signed)
Called and spoke with pt and he stated that he had some blood in his stool this morning and has never had this before.  Not due for a colon until next year.  Pt is requesting to speak with SN directly about this.   thanks

## 2012-07-23 NOTE — Telephone Encounter (Addendum)
Leigh, pls advise if triage needs to do anything further for this.  I called pt & discussed- one episode sm vol hematochezia w/ straining, known hems on last colon DrPerry 2008... Rec to take stool softeners, watch for recurrent bleeding; due for routine f/u colon 2015 otherw call drPerry sooner if bleeding recurs.Marland KitchenMarland KitchenMarland Kitchen

## 2012-08-03 ENCOUNTER — Encounter: Payer: Self-pay | Admitting: Pulmonary Disease

## 2012-08-03 ENCOUNTER — Other Ambulatory Visit: Payer: Self-pay

## 2012-08-05 NOTE — Telephone Encounter (Signed)
Health Maintenance and Immunizations updated. Pt notified and asked to let us know if update does not show in MyChart. Nothing further needed; will sign off.

## 2012-10-19 ENCOUNTER — Telehealth: Payer: Self-pay | Admitting: Pulmonary Disease

## 2012-10-19 ENCOUNTER — Encounter (HOSPITAL_COMMUNITY): Payer: Self-pay | Admitting: Emergency Medicine

## 2012-10-19 ENCOUNTER — Emergency Department (HOSPITAL_COMMUNITY)
Admission: EM | Admit: 2012-10-19 | Discharge: 2012-10-19 | Disposition: A | Discharge status: Home / Self Care | Payer: BC Managed Care – PPO | Source: Home / Self Care | Attending: Emergency Medicine | Admitting: Emergency Medicine

## 2012-10-19 DIAGNOSIS — K644 Residual hemorrhoidal skin tags: Secondary | ICD-10-CM

## 2012-10-19 DIAGNOSIS — K648 Other hemorrhoids: Secondary | ICD-10-CM

## 2012-10-19 MED ORDER — HYDROCORTISONE ACETATE 25 MG RE SUPP: 25.0000 mg | Freq: Two times a day (BID) | RECTAL | Status: DC

## 2012-10-19 MED ORDER — HYDROCORTISONE 2.5 % RE CREA: TOPICAL_CREAM | RECTAL | Status: DC

## 2012-10-19 NOTE — ED Notes (Signed)
Pt c/o rectal bleeding onset this am when he had a bowl movement Hx of external hemorrhoids  Denies: pain, f/v/n/d, mucous on stools  He is alert and oriented w/no signs of acute distress.

## 2012-10-19 NOTE — Telephone Encounter (Signed)
10/19/2012 1040 am  Called answering service with rectal bleeding.   After normal BM he began having rectal bleeding - noted after wiping. Has placed tissue at rectum (3-4 squares) and has changed 8-10 times since 0745.  Known hx hemorrhoids.  Had a similar episode 3 mo's ago which resolved spontaneously.   Denies bloody stool - nml BM.     Plan: -patient instructed to seek care at an urgent care facility if bleeding doesn't slow / resolve -recommend pt to be seen in office next week if not seen over weekend.   -instructed to hold aspirin this week, high fiber diet   Canary Brim, NP-C [facility] Pgr: [phone] or [phone]

## 2012-10-19 NOTE — ED Provider Notes (Addendum)
Chief Complaint:   Chief Complaint  Patient presents with  . Rectal Bleeding    History of Present Illness:    Russell [name] is a 67 year old male who around 7:45 this morning had a regular bowel movement followed by bright red blood per rectum.  He estimates this was several tablespoons in all. There were no clots. He denies any rectal pain, itching, or irritation. He's had no bowel pain, nausea, vomiting, or hematemesis. He has a history of hemorrhoids. He had a colonoscopy in 2008 which revealed hemorrhoids and colon polyps. This was done by Dr. Yancey Flemings.  Review of Systems:  Other than noted above, the patient denies any of the following symptoms: Constitutional:  No fever, chills, fatigue, weight loss or anorexia. Lungs:  No cough or shortness of breath. Heart:  No chest pain, palpitations, syncope or edema.  No cardiac history. Abdomen:  No nausea, vomiting, hematememesis, melena, constipation, or diarrhea. GU:  No dysuria, frequency, urgency, or hematuria.   Skin:  No rash or itching.  PMFSH:  Past medical history, family history, social history, meds, and allergies were reviewed along with nurse's notes.  No prior abdominal surgeries or history of GI problems.  No use of NSAIDs.  No excessive  alcohol intake.  He has hyperlipidemia and takes simvastatin and low-dose aspirin.  Physical Exam:   Vital signs:  BP 170/90  Pulse 54  Temp(Src) 98.2 F (36.8 C) (Oral)  Resp 18  SpO2 100% Gen:  Alert, oriented, in no distress. Lungs:  Breath sounds clear and equal bilaterally.  No wheezes, rales or rhonchi. Heart:  Regular rhythm.  No gallops or murmers.   Abdomen:  Soft, nontender, no organomegaly or mass. Skin:  Clear, warm and dry.  No rash.  Procedure Note:  Verbal informed consent was obtained from the patient.  Risks and benefits were outlined with the patient.  Patient understands and accepts these risks.  Identity of the patient was confirmed verbally and by armband.     Procedure was performed as followed:  External exam reveals a large rosette of external hemorrhoid tags surrounding the entire anus. This was not bleeding or inflamed. He did have a prolapsing internal hemorrhoid which was purplish in color and oozing some blood. The endoscope was inserted to its full length and it showed just 2 internal hemorrhoids, one large and one small, both on the right side. The larger one was the one that was oozing the blood. Digital rectal exam reveals no masses, moderately enlarged prostate gland without nodules, and heme positive stools.  Patient tolerated the procedure well without any immediate complications.  Assessment:  The primary encounter diagnosis was Hemorrhoids, internal, with bleeding. A diagnosis of External hemorrhoids was also pertinent to this visit.  He has visible hemorrhoids on his endoscopic examination, but I also warned him that there could be something higher up bleeding and that he should see Dr. Marina Goodell for a repeat colonoscopy. He may be able to inject the hemorrhoids, band them, or if need be, refer him to a surgeon at that time.  Plan:   1.  The following meds were prescribed:   Discharge Medication List as of 10/19/2012 12:10 PM    START taking these medications   Details  hydrocortisone (ANUSOL-HC) 2.5 % rectal cream Apply rectally 2 times daily, Normal    hydrocortisone (ANUSOL-HC) 25 MG suppository Place 1 suppository (25 mg total) rectally 2 (two) times daily., Starting 10/19/2012, Until Discontinued, Normal  2.  The patient was instructed in symptomatic care and handouts were given. 3.  The patient was told to return if becoming worse in any way, if no better in 3 or 4 days, and given some red flag symptoms such as  Worsening bleeding, abdominal pain, dizziness, or syncope that would indicate earlier return.     Reuben Likes, MD 10/19/12 2107  Reuben Likes, MD 10/21/12 [phone]

## 2012-10-21 ENCOUNTER — Telehealth: Payer: Self-pay | Admitting: Internal Medicine

## 2012-10-21 LAB — OCCULT BLOOD, POC DEVICE: Fecal Occult Bld: POSITIVE — AB

## 2012-10-21 NOTE — Telephone Encounter (Signed)
Spoke with patient's wife and offered OV this week with extender. She states patient prefers next week d/t work schedule. Scheduled patient with Mike Gip, PA on 10/29/11 at 8:30 AM.

## 2012-10-28 ENCOUNTER — Encounter: Payer: Self-pay | Admitting: Physician Assistant

## 2012-10-28 ENCOUNTER — Encounter: Payer: Self-pay | Admitting: Internal Medicine

## 2012-10-28 ENCOUNTER — Ambulatory Visit (INDEPENDENT_AMBULATORY_CARE_PROVIDER_SITE_OTHER): Payer: BC Managed Care – PPO | Admitting: Physician Assistant

## 2012-10-28 VITALS — BP 118/80 | HR 52 | Ht 71.25 in | Wt 191.1 lb

## 2012-10-28 DIAGNOSIS — K625 Hemorrhage of anus and rectum: Secondary | ICD-10-CM

## 2012-10-28 MED ORDER — MOVIPREP 100 G PO SOLR: 1.0000 | Freq: Once | ORAL | Status: DC

## 2012-10-28 NOTE — Patient Instructions (Addendum)
We sent the prescription for the colonoscopy prep, Moviprep to [facility].  If taking the baby aspirin, you can hold it 3 days prior to the colonoscopy. You have been scheduled for a colonoscopy with propofol. Please follow written instructions given to you at your visit today.  Please pick up your prep kit at the pharmacy within the next 1-3 days. If you use inhalers (even only as needed), please bring them with you on the day of your procedure. Your physician has requested that you go to www.startemmi.com and enter the access code given to you at your visit today. This web site gives a general overview about your procedure. However, you should still follow specific instructions given to you by our office regarding your preparation for the procedure.

## 2012-10-28 NOTE — Progress Notes (Signed)
Agree with initial assessment and plans as outlined 

## 2012-10-28 NOTE — Progress Notes (Signed)
Subjective:    Patient ID: Russell [name], male    DOB: [birth year], 67 y.o.   MRN: [id number]  HPI Russell [name] is a pleasant 67 year old male known to Dr. Marina Goodell who had undergone 2 prior colonoscopies. His last exam was done in February of 2008 and showed internal and necks terminal hemorrhoids and one 3 mm colon polyp which was removed. Path on this polyp to showed benign colorectal mucosa. Patient does have family history of colon cancer in  his paternal grandfather . Patient had an episode about a week ago with rectal bleeding. He says he had a bowel movement that morning may have strained a bit more than usual and then passed a fairly significant amount of bright red blood with bowel movement. He says he continued to see bright red blood with his bowel movements for the next 4 days go into smaller amounts and since the bleeding has resolved. He was seen at urgent care and had evaluation by Dr. Lorenz Coaster with anoscopy which was positive for internal hemorrhoids one of which was felt to be oozing. Patient has been using suppositories over the past week and external cream. He denies any rectal pain or discomfort, no abnormal pain or cramping or changes in his bowel habits. He was originally scheduled for routine surveillance colonoscopy in 2015     Review of Systems  Constitutional: Negative.   HENT: Negative.   Eyes: Negative.   Respiratory: Negative.   Cardiovascular: Negative.   Gastrointestinal: Positive for anal bleeding.  Endocrine: Negative.   Allergic/Immunologic: Negative.   Neurological: Negative.   Hematological: Negative.   Psychiatric/Behavioral: Negative.    Outpatient Prescriptions Prior to Visit  Medication Sig Dispense Refill  . hydrocortisone (ANUSOL-HC) 2.5 % rectal cream Apply rectally 2 times daily  30 g  2  . hydrocortisone (ANUSOL-HC) 25 MG suppository Place 1 suppository (25 mg total) rectally 2 (two) times daily.  12 suppository  2  . Multiple Vitamins-Minerals  (MULTIVITAMIN & MINERAL PO) Take 1 tablet by mouth daily.      . simvastatin (ZOCOR) 40 MG tablet Take one tablet by mouth once daily  90 tablet  3  . aspirin 81 MG tablet Take 81 mg by mouth daily.       No facility-administered medications prior to visit.      No Known Allergies Patient Active Problem List   Diagnosis Date Noted  . Physical exam, annual 03/19/2012  . Kidney stone 03/19/2012  . COLONIC POLYPS 06/28/2008  . HYPERCHOLESTEROLEMIA, BORDERLINE 06/28/2008  . SMOKER 06/28/2008  . HEMORRHOIDS 06/28/2008  . ACUTE PROSTATITIS 06/28/2008  . DEGENERATIVE JOINT DISEASE 06/28/2008  . SKIN CANCER, HX OF 06/28/2008   History  Substance Use Topics  . Smoking status: Former Smoker    Types: Cigars  . Smokeless tobacco: Never Used  . Alcohol Use: 1.2 oz/week    1 Cans of beer, 1 Glasses of wine per week     Comment: daily use   family history is not on file.  Objective:   Physical Exam old white male in no acute distress, pleasant blood pressure 118/80 pulse 52 height 5 foot 11 weight 191. HEENT; nontraumatic normocephalic EOMI PERRLA sclera anicteric, Neck;Supple no JVD, Cardiovascular; regular rate and rhythm with S1-S2 no murmur or gallop, Pulmonary; clear bilaterally, Abdomen; soft nontender nondistended bowel sounds are active no palpable mass or hepatosplenomegaly, Rectal ;exam not repeated, Extremities ;no clubbing cyanosis or edema, Psych; mood and affect normal and appropriate.  Assessment & Plan:  #95 67 year old white male with bright red blood per rectum. Patient does have previously documented internal and necks terminal hemorrhoids and his bleeding may be hemorrhoidal in etiology however her last colonoscopy was done 6 years ago and therefore cannot rule out occult colon lesion #2 positive family history of colon cancer in maternal grandfather #3 history of prostatitis #4 hyperlipidemia  Plan; will schedule for colonoscopy with Dr. Garner Gavel was  discussed in detail with the patient and he is agreeable to proceed. He has refills on Anusol-HC suppositories to use on an as-needed basis.

## 2012-11-19 ENCOUNTER — Ambulatory Visit: Payer: BC Managed Care – PPO | Admitting: Internal Medicine

## 2012-11-22 ENCOUNTER — Encounter: Payer: Self-pay | Admitting: Internal Medicine

## 2012-11-22 ENCOUNTER — Ambulatory Visit (AMBULATORY_SURGERY_CENTER): Payer: BC Managed Care – PPO | Admitting: Internal Medicine

## 2012-11-22 VITALS — BP 136/73 | HR 40 | Temp 96.5°F | Resp 14 | Ht 71.0 in | Wt 191.0 lb

## 2012-11-22 DIAGNOSIS — D126 Benign neoplasm of colon, unspecified: Secondary | ICD-10-CM

## 2012-11-22 DIAGNOSIS — K625 Hemorrhage of anus and rectum: Secondary | ICD-10-CM

## 2012-11-22 MED ORDER — SODIUM CHLORIDE 0.9 % IV SOLN: 500.0000 mL | INTRAVENOUS | Status: DC

## 2012-11-22 NOTE — Progress Notes (Signed)
Patient did not experience any of the following events: a burn prior to discharge; a fall within the facility; wrong site/side/patient/procedure/implant event; or a hospital transfer or hospital admission upon discharge from the facility. (G8907) Patient did not have preoperative order for IV antibiotic SSI prophylaxis. (G8918)  

## 2012-11-22 NOTE — Progress Notes (Signed)
Called to room to assist during endoscopic procedure.  Patient ID and intended procedure confirmed with present staff. Received instructions for my participation in the procedure from the performing physician.  

## 2012-11-22 NOTE — Patient Instructions (Addendum)
Impressions/recommendations:  Polyps (handout given) Diverticulosis (handout given) High Fiber diet (handout given)  Follow up colonoscopy in 5 years.  YOU HAD AN ENDOSCOPIC PROCEDURE TODAY AT THE  ENDOSCOPY CENTER: Refer to the procedure report that was given to you for any specific questions about what was found during the examination.  If the procedure report does not answer your questions, please call your gastroenterologist to clarify.  If you requested that your care partner not be given the details of your procedure findings, then the procedure report has been included in a sealed envelope for you to review at your convenience later.  YOU SHOULD EXPECT: Some feelings of bloating in the abdomen. Passage of more gas than usual.  Walking can help get rid of the air that was put into your GI tract during the procedure and reduce the bloating. If you had a lower endoscopy (such as a colonoscopy or flexible sigmoidoscopy) you may notice spotting of blood in your stool or on the toilet paper. If you underwent a bowel prep for your procedure, then you may not have a normal bowel movement for a few days.  DIET: Your first meal following the procedure should be a light meal and then it is ok to progress to your normal diet.  A half-sandwich or bowl of soup is an example of a good first meal.  Heavy or fried foods are harder to digest and may make you feel nauseous or bloated.  Likewise meals heavy in dairy and vegetables can cause extra gas to form and this can also increase the bloating.  Drink plenty of fluids but you should avoid alcoholic beverages for 24 hours.  ACTIVITY: Your care partner should take you home directly after the procedure.  You should plan to take it easy, moving slowly for the rest of the day.  You can resume normal activity the day after the procedure however you should NOT DRIVE or use heavy machinery for 24 hours (because of the sedation medicines used during the test).     SYMPTOMS TO REPORT IMMEDIATELY: A gastroenterologist can be reached at any hour.  During normal business hours, 8:30 AM to 5:00 PM Monday through Friday, call [phone].  After hours and on weekends, please call the GI answering service at [phone] who will take a message and have the physician on call contact you.   Following lower endoscopy (colonoscopy or flexible sigmoidoscopy):  Excessive amounts of blood in the stool  Significant tenderness or worsening of abdominal pains  Swelling of the abdomen that is new, acute  Fever of 100F or higher   FOLLOW UP: If any biopsies were taken you will be contacted by phone or by letter within the next 1-3 weeks.  Call your gastroenterologist if you have not heard about the biopsies in 3 weeks.  Our staff will call the home number listed on your records the next business day following your procedure to check on you and address any questions or concerns that you may have at that time regarding the information given to you following your procedure. This is a courtesy call and so if there is no answer at the home number and we have not heard from you through the emergency physician on call, we will assume that you have returned to your regular daily activities without incident.  SIGNATURES/CONFIDENTIALITY: You and/or your care partner have signed paperwork which will be entered into your electronic medical record.  These signatures attest to the fact that that  the information above on your After Visit Summary has been reviewed and is understood.  Full responsibility of the confidentiality of this discharge information lies with you and/or your care-partner.

## 2012-11-22 NOTE — Progress Notes (Signed)
Report to pacu rn, vss, bbs=clear 

## 2012-11-22 NOTE — Op Note (Signed)
[facility] [address] [place], [address]   COLONOSCOPY PROCEDURE REPORT  PATIENT: [name], Russell  MR#: [id number] BIRTHDATE: 1945/09/16 , 66  yrs. old GENDER: Male ENDOSCOPIST: Roxy Cedar, MD REFERRED BY:.  Office PROCEDURE DATE:  11/22/2012 PROCEDURE:   Colonoscopy with snare polypectomy    x 4 ASA CLASS:   Class II INDICATIONS:rectal bleeding.   Prior colonoscopies, 2002, 2008, w/o adenomas MEDICATIONS: MAC sedation, administered by CRNA and propofol (Diprivan) 400mg  IV  DESCRIPTION OF PROCEDURE:   After the risks benefits and alternatives of the procedure were thoroughly explained, informed consent was obtained.  A digital rectal exam revealed external hemorrhoids.   The [id number]  endoscope was introduced through the anus and advanced to the cecum, which was identified by both the appendix and ileocecal valve. No adverse events experienced.   The quality of the prep was excellent, using MoviPrep  The instrument was then slowly withdrawn as the colon was fully examined.      COLON FINDINGS: Four polyps ranging between 5-77mm in size were found in the transverse colon, sigmoid colon, and rectum.  A polypectomy was performed with a cold snare.  The resection was complete and the polyp tissue was completely retrieved.   Mild diverticulosis was noted in the sigmoid colon.   The colon mucosa was otherwise normal.  Retroflexed views revealed internal hemorrhoids. The time to cecum=2 minutes 19 seconds.  Withdrawal time=15 minutes 50 seconds.  The scope was withdrawn and the procedure completed. COMPLICATIONS: There were no complications.  ENDOSCOPIC IMPRESSION: 1.   Four polyps ranging between 5-53mm in size were found in the transverse colon, sigmoid colon, and rectum; polypectomy was performed with a cold snare 2.   Mild diverticulosis was noted in the sigmoid colon 3.   The colon mucosa was otherwise normal  RECOMMENDATIONS: 1.  Follow up colonoscopy in 5 years   eSigned:  Roxy Cedar, MD 11/22/2012 3:27 PM  cc: Michele Mcalpine, MD and The Patient   PATIENT NAME:  Russell, [name] MR#: [id number]

## 2012-11-25 ENCOUNTER — Telehealth: Payer: Self-pay | Admitting: *Deleted

## 2012-11-25 NOTE — Telephone Encounter (Signed)
  Follow up Call-  Call back number 11/22/2012  Post procedure Call Back phone  # [phone]  Permission to leave phone message Yes     Patient questions:  Do you have a fever, pain , or abdominal swelling? no Pain Score  0 *  Have you tolerated food without any problems? yes  Have you been able to return to your normal activities? yes  Do you have any questions about your discharge instructions: Diet   no Medications  no Follow up visit  no  Do you have questions or concerns about your Care? no  Actions: * If pain score is 4 or above: No action needed, pain <4.

## 2012-11-27 ENCOUNTER — Encounter: Payer: Self-pay | Admitting: Internal Medicine

## 2013-03-19 ENCOUNTER — Ambulatory Visit (INDEPENDENT_AMBULATORY_CARE_PROVIDER_SITE_OTHER): Payer: Medicare Other | Admitting: Pulmonary Disease

## 2013-03-19 ENCOUNTER — Other Ambulatory Visit (INDEPENDENT_AMBULATORY_CARE_PROVIDER_SITE_OTHER): Payer: Medicare Other

## 2013-03-19 ENCOUNTER — Ambulatory Visit (INDEPENDENT_AMBULATORY_CARE_PROVIDER_SITE_OTHER)
Admission: RE | Admit: 2013-03-19 | Discharge: 2013-03-19 | Disposition: A | Discharge status: Home / Self Care | Payer: Medicare Other | Source: Ambulatory Visit | Attending: Pulmonary Disease | Admitting: Pulmonary Disease

## 2013-03-19 ENCOUNTER — Encounter: Payer: Self-pay | Admitting: Pulmonary Disease

## 2013-03-19 VITALS — BP 120/80 | HR 54 | Temp 97.7°F | Ht 72.0 in | Wt 188.8 lb

## 2013-03-19 DIAGNOSIS — M1990 Unspecified osteoarthritis, unspecified site: Secondary | ICD-10-CM

## 2013-03-19 DIAGNOSIS — K649 Unspecified hemorrhoids: Secondary | ICD-10-CM

## 2013-03-19 DIAGNOSIS — D126 Benign neoplasm of colon, unspecified: Secondary | ICD-10-CM

## 2013-03-19 DIAGNOSIS — Z0000 Encounter for general adult medical examination without abnormal findings: Secondary | ICD-10-CM | POA: Diagnosis not present

## 2013-03-19 DIAGNOSIS — N320 Bladder-neck obstruction: Secondary | ICD-10-CM

## 2013-03-19 DIAGNOSIS — F419 Anxiety disorder, unspecified: Secondary | ICD-10-CM

## 2013-03-19 DIAGNOSIS — Z85828 Personal history of other malignant neoplasm of skin: Secondary | ICD-10-CM

## 2013-03-19 DIAGNOSIS — F17200 Nicotine dependence, unspecified, uncomplicated: Secondary | ICD-10-CM

## 2013-03-19 DIAGNOSIS — F411 Generalized anxiety disorder: Secondary | ICD-10-CM

## 2013-03-19 DIAGNOSIS — R0989 Other specified symptoms and signs involving the circulatory and respiratory systems: Secondary | ICD-10-CM | POA: Diagnosis not present

## 2013-03-19 DIAGNOSIS — Z23 Encounter for immunization: Secondary | ICD-10-CM

## 2013-03-19 DIAGNOSIS — E785 Hyperlipidemia, unspecified: Secondary | ICD-10-CM | POA: Diagnosis not present

## 2013-03-19 DIAGNOSIS — J8410 Pulmonary fibrosis, unspecified: Secondary | ICD-10-CM

## 2013-03-19 DIAGNOSIS — K5730 Diverticulosis of large intestine without perforation or abscess without bleeding: Secondary | ICD-10-CM | POA: Insufficient documentation

## 2013-03-19 DIAGNOSIS — J984 Other disorders of lung: Secondary | ICD-10-CM

## 2013-03-19 DIAGNOSIS — N200 Calculus of kidney: Secondary | ICD-10-CM

## 2013-03-19 LAB — CBC WITH DIFFERENTIAL/PLATELET
Basophils Relative: 0.6 % (ref 0.0–3.0)
Eosinophils Absolute: 0.4 10*3/uL (ref 0.0–0.7)
Eosinophils Relative: 4.5 % (ref 0.0–5.0)
Hemoglobin: 15.3 g/dL (ref 13.0–17.0)
Lymphocytes Relative: 22.3 % (ref 12.0–46.0)
MCHC: 34.6 g/dL (ref 30.0–36.0)
MCV: 96.1 fl (ref 78.0–100.0)
Monocytes Absolute: 0.5 10*3/uL (ref 0.1–1.0)
Monocytes Relative: 5.9 % (ref 3.0–12.0)
Neutrophils Relative %: 66.7 % (ref 43.0–77.0)
Platelets: 224 10*3/uL (ref 150.0–400.0)
RBC: 4.61 Mil/uL (ref 4.22–5.81)
WBC: 9 10*3/uL (ref 4.5–10.5)

## 2013-03-19 MED ORDER — SIMVASTATIN 40 MG PO TABS: ORAL_TABLET | ORAL | Status: DC

## 2013-03-19 NOTE — Progress Notes (Signed)
Subjective:     Patient ID: Russell [name], male   DOB: Feb 05, 1946, 67 y.o.   MRN: [id number]  HPI 67 y/o WM here for a follow up visit...   ~  May 31, 2009:  yearly physical- doing well, no complaints... 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  ~  August 22, 2010:  Yearly follow up- doing well overall notes some left knee pain (he works out in gym 5d/wk +golf etc & uses Advil Prn), occas nasal drainage & epistaxis (wife made appt w/ ENT later this week- we discussed saline)...    On Simva40 for Chol & FLP 3/12 shows TChol 157, TG 54, HDL 54, LDL 92> continue same +diet etc...    EKG= SBrady 54/min, WNL.Marland KitchenMarland Kitchen CPX labs all look good (he didn't go for his XRay today)...  ~  March 19, 2012:  19 month ROV & CPX> Russell [name] has had a good interval except for the right kidney stone 7-8/12, treated via ER & was able to pass, then seen by Urology (per pt hx we do not have notes & nothing in Epic), sounds like calc oxalate stones w/ rec for dietary adjust- no colas, tea, etc & increase citrate; he has been doing well since then & no known recurrence...    His only meds are ASA81 and Simva40> tol well & FLP today is good- all parameters at goals, continue same...  His BP is sl elev today at 160/88 but he's had a stressful morn, & BP's at his last two blood donor appts= 110-120/ 60-70's;  He exercises regularly w/o difficulty...    We reviewed prob list, meds, xrays and labs> see below for updates >> he'll get flu vaccine next week... CXR 10/13 showed normal heart size, clear lungs, tiny granuloma left base, NAD... LABS 10/13:  FLP- at goals on Simva40;  Chems- wnl;  CBC- wnl;  TSH=0.83;  PSA= 1.23;  Urine- clear  ~  March 19, 2013:  Yearly ROV & Russell [name] is doing well x for some left knee pain & he has appt w/ Ortho next  week; a business assoc recently passed from metastatic colon ca; he exercises regularly- 5d per week at sport time... We reviewed the following medical problems during today's office visit >>     Smoker & Abn CXR> he smokes cigars on the golf course & has a sm LLL granuloma on CXR; he remains asymptomatic w/o cough, sput, SOB, CP, etc...    CHOL> on Simva40; prev LDL as high as 150 range on diet alone; FLP 10/14 shows TChol 149, TG 28, HDL 62, LDL 81    GI- divertics, colon polyps & hems> grandfather had colon cancer; pt noted rectal bleeding 5/14 & saw Russell [name] in f/u> colonoscopy done 6/14 w/ divertics, hems, & 4 polyps- tubulovillous adenomas & f/u planned 3-41yrs    GU- hx prostatitis & kid stones> had stone 2012 able to pass it; Urology eval by Russell [name] & diet adjust; no recurrent problem since then...    DJD> he has seen Russell [name] in the past 7 had left knee arthroscopy; says he was told bone on bone & would need replacement; he takes 3 Advil prior to golfing...    Hx skin cancer> his friend and business partner died from metastatic melanoma; pt has Russell [name]  check his skin every 21mo...    Blood donor> his has donated over 150 pts he says; continues to donate regularly; labs 10/14 showed Hg= 15.3 We reviewed prob list, meds, xrays and labs> see below for updates >>  CXR 10/14 showed normal heart size, clear lungs, 2mm granuloma LLL, NAD.Marland KitchenMarland Kitchen EKG-  SBrady, rate54, wnl, NAD... LABS 10/14:  FLP- at goals on Simva40;  Chems- wnl;  CBC- wnl;  TSH=1.14;  PSA=1.43...           Problem List:   SMOKER (ICD-305.1) - he is a cigar smoker and states that he does not inhale... smokers only occas "when I play golf or go to the bar"... ~  CXR 3/12 showed norm heart size, mild interstitial thickening, 3mm calc granuloma left lung base. ~  CTAbd 7/12 in ER for kidney stone showed incidental mild bullous changes at lung bases & bibasilar atx; also atherosclerotic changes in Ao & branches w/o aneurysm.  ~  CXR 10/13  showed tiny calcif granuloma at left lung base, norm heart size, otherw clear lungs... ~  CXR 10/14 showed normal heart size, clear lungs, 2mm granuloma LLL, NAD  HYPERCHOLESTEROLEMIA, BORDERLINE (ICD-272.4) - on diet Rx alone & prev not interested in med Rx... now he states he will consider medication if no better on his diet... ~  FLP 11/06 showed TChol 210, Tg 41, HDL 59, LDL 138... pt refers diet Rx. ~  FLP 12/09 showed TChol 219, Tg 43, HDL 53, LDL 141... rec> start Simva but he refuses, prefers diet. ~  FLP 12/10 showed TChol  222, TG 111, HDL 53, LDL 153... rec> start Simva40. ~  FLP 3/11 on Simva40 showed TChol 141, TG 36, HDL 59, LDL 75... continue Simva40. ~  FLP 3/12 on Simva40 showed TChol 157, TG 54, HDL 54, LDL 92 ~  FLP 10/13 on Simva40 showed TChol 163, TG 35, HDL 73, LDL 83 ~  FLP 10/14 on Simva40 showed TChol 149, TG 28, HDL 62, LDL 81   Hx of COLONIC POLYPS (ICD-211.3), & HEMORRHOIDS (ICD-455.6) - + FamHx colon cancer in his grandfather... last colonoscopy 2/08 by Russell [name] showed 3mm polyp & hems... path= polypoid mucosa w/o adenomatous change... f/u planned 44yrs. ~  grandfather had colon cancer; pt noted rectal bleeding 5/14 & saw Russell [name] in f/u> colonoscopy done 6/14 w/ divertics, hems, & 4 polyps- tubulovillous adenomas & f/u planned 3-52yrs  Hx of ACUTE PROSTATITIS (ICD-601.0) - prev eval & Rx by Russell [name]... no symptoms...  KIDNEY STONES >>  ~  New onset problem 7/12 & went to ER for treatment; Rx conservatively & he was able to pass stone; followed up w/ Urology, Russell [name] (we don't have notes) & he reports stone was calc oxalate & rec to avoid colas, tea, etc; increase water intake & use citrate when poss... 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 ~  XRay Abd in ER 8/12  showed mod stool burden, mild lumbar spondylosis, NAD... ~  No known recurrence of stone disease to date...  DEGENERATIVE JOINT DISEASE (ICD-715.90) - he had a prev left knee arthroscopy by Russell [name] in 2008... he take 3 ADVIL prior to golfing... ~  10/14: states he was prev told bone on bone in knee 7 he'd need TKR per Russell [name]; he has f/u appt w/ Ortho soon...  SKIN  CANCER, HX OF (ICD-V10.83) - he sees Russell [name] for skin check every 6 months... 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 ~  labs 12/09 showed Hg= 15.7 ~  labs 12/10 showed Hg= 16.6 ~  Labs 3/12 showed Hg= 16.1 ~  Labs 10/13 showed Hg= 15.0 ~  Labs 10/14 showed Hg= 15.3  HEALTH MAINTENANCE:  he takes ASA 81mg  on MWF since he has epistaxis on daily ASA early in 2009... ~  GI:  Russell [name] & up to date on screening colonoscopy... ~  GU:  Russell [name] in the past... PSA 10/14 = 1.43 ~  Immuniz:  He gets the yearly flu vaccine... Tetanus shot given in 2006...  Pneumovax due at age 59.   Past Medical History  Diagnosis Date  . Smoker   . Other and unspecified hyperlipidemia   . Hx of colonic polyps   . Hemorrhoids   . Acute prostatitis   . DJD (degenerative joint disease)   . History of skin cancer   . Whole blood donor     Past Surgical History  Procedure Laterality Date  . Tonsillectomy and adenoidectomy    . Skin cancer removed    . Left knee arthroscopy  03/2007  . 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 Lisette Grinder at [facility] Encounter Prescriptions as of 03/19/2013  Medication Sig Dispense Refill  . aspirin 81 MG tablet Take 81 mg by mouth daily.      . hydrocortisone (ANUSOL-HC) 2.5 % rectal cream Apply rectally 2 times daily as needed      . Multiple Vitamins-Minerals (MULTIVITAMIN & MINERAL PO) Take 1 tablet by mouth daily.      . simvastatin (ZOCOR) 40 MG tablet  Take one tablet by mouth once daily  90 tablet  3  . [DISCONTINUED] hydrocortisone (ANUSOL-HC) 2.5 % rectal cream Apply rectally 2 times daily  30 g  2   No facility-administered encounter medications on file as of 03/19/2013.    No Known Allergies   Current Medications, Allergies, Past Medical History, Past Surgical History, Family History, and Social History were reviewed in [employer] record.   Review of Systems         The patient complains of joint pain and arthritis.  The patient denies fever, chills, sweats, anorexia, fatigue, weakness, malaise, weight loss, sleep disorder, blurring, diplopia, eye irritation, eye discharge, vision loss, eye pain, photophobia, earache, ear discharge, tinnitus, decreased hearing, nasal congestion, nosebleeds, sore throat, hoarseness, chest pain, palpitations, syncope, dyspnea on exertion, orthopnea, PND, peripheral edema, cough, dyspnea at rest, excessive sputum, hemoptysis, wheezing, pleurisy, nausea, vomiting, diarrhea, constipation, change in bowel habits, abdominal pain, melena, hematochezia, jaundice, gas/bloating, indigestion/heartburn, dysphagia, odynophagia, dysuria, hematuria, urinary frequency, urinary hesitancy, nocturia, incontinence, back pain, joint swelling, muscle cramps, muscle weakness, stiffness, sciatica, restless legs, leg pain at night, leg pain with exertion, rash, itching, dryness, suspicious lesions, paralysis, paresthesias, seizures, tremors, vertigo, transient blindness, frequent falls, frequent headaches, difficulty walking, depression, anxiety, memory loss, confusion, cold intolerance, heat intolerance, polydipsia, polyphagia, polyuria, unusual weight change, abnormal bruising, bleeding, enlarged lymph nodes, urticaria, allergic rash, hay fever, and recurrent infections.     Objective:   Physical Exam    WD, WN, 67 y/o WM in NAD...  GENERAL:  Alert & oriented; pleasant & cooperative... HEENT:  [place]/AT,  EOM-wnl, PERRLA,  EACs-clear, TMs-wnl, NOSE-clear, THROAT-clear & wnl. NECK:  Supple w/ full ROM; no JVD; normal carotid impulses w/o bruits; no thyromegaly or nodules palpated; no lymphadenopathy. CHEST:  Clear to P & A; without wheezes/ rales/ or rhonchi. HEART:  Regular Rhythm; without murmurs/ rubs/ or gallops. ABDOMEN:  Soft & nontender; normal bowel sounds; no organomegaly or masses detected. RECTAL:  Neg - prostate 2+ & nontender w/o nodules; stool hematest neg. EXT: without deformities, mild arthritic changes; no varicose veins/ venous insuffic/ or edema. NEURO:  CN's intact; motor testing normal; sensory testing normal; gait normal & balance OK. DERM:  No lesions noted; no rash etc...  RADIOLOGY DATA:  Reviewed in the EPIC EMR & discussed w/ the patient...  LABORATORY DATA:  Reviewed in the EPIC EMR & discussed w/ the patient...   Assessment:      SMOKER>  CXR is stable, NAD; 2mm calcif granuloma noted in LLL; CTAbd in 2012 showed incidental bullous changes at lung bases; must quit all smoking...  CHOL>  FLP looks good on Simva40; continue same + diet...  Hx Colon Polyps>  followed by Russell [name] & f/u colon done 6/14 by Russell [name]- 4 sm polyps removed & 3 were tubulovillous adenomas- f/u colon planned 41yrs per GI...  Hx Kid Stones & Prostatitis in past>  No recurrent kid stones or prostate symptoms; PSA= 1.43  DJD>  Followed by Criss Alvine; He has f/u appt to see about poss TKR...  Skin Ca>  Followed by Ginette Pitman & pt reports doing OK...   Whole Blood Donor>  A+ blood type & has given ~150 pints to date...     Plan:     Patient's Medications  New Prescriptions   No medications on file  Previous Medications   ASPIRIN 81 MG TABLET    Take 81 mg by mouth daily.   MULTIPLE VITAMINS-MINERALS (MULTIVITAMIN & MINERAL PO)    Take 1 tablet by mouth daily.  Modified Medications   Modified Medication Previous Medication   HYDROCORTISONE (ANUSOL-HC) 2.5 % RECTAL CREAM hydrocortisone  (ANUSOL-HC) 2.5 % rectal cream      Apply rectally 2 times daily as needed    Apply rectally 2 times daily   SIMVASTATIN (ZOCOR) 40 MG TABLET simvastatin (ZOCOR) 40 MG tablet      Take one tablet by mouth once daily    Take one tablet by mouth once daily  Discontinued Medications   No medications on file

## 2013-03-19 NOTE — Patient Instructions (Addendum)
Today we updated your med list in our EPIC system...    Continue your current medications the same...    We refilled your simastatin today...  Today we did your follow up CSR, EKG, & FASTING blood work...    We will contact you w/ the results when available...   Keep up the great job w/ your exercise program...  Call for any questions...  Let's plan a follow up visit in 76yr, sooner if needed for problems.Marland KitchenMarland Kitchen

## 2013-03-20 LAB — BASIC METABOLIC PANEL
BUN: 17 mg/dL (ref 6–23)
CO2: 24 mEq/L (ref 19–32)
Calcium: 9.6 mg/dL (ref 8.4–10.5)
Chloride: 109 mEq/L (ref 96–112)
Creatinine, Ser: 0.9 mg/dL (ref 0.4–1.5)
GFR: 96.11 mL/min (ref >60.00)
Glucose, Bld: 92 mg/dL (ref 70–99)
Potassium: 4.9 mEq/L (ref 3.5–5.1)

## 2013-03-20 LAB — HEPATIC FUNCTION PANEL
ALT: 27 U/L (ref 0–53)
AST: 27 U/L (ref 0–37)
Albumin: 4.2 g/dL (ref 3.5–5.2)
Alkaline Phosphatase: 66 U/L (ref 39–117)
Bilirubin, Direct: 0.1 mg/dL (ref 0.0–0.3)
Total Bilirubin: 0.8 mg/dL (ref 0.3–1.2)
Total Protein: 7 g/dL (ref 6.0–8.3)

## 2013-03-20 LAB — LIPID PANEL
Cholesterol: 149 mg/dL (ref 0–200)
LDL Cholesterol: 81 mg/dL (ref 0–99)
Total CHOL/HDL Ratio: 2
Triglycerides: 28 mg/dL (ref 0.0–149.0)

## 2013-03-21 LAB — PSA: PSA: 1.43 ng/mL (ref 0.10–4.00)

## 2013-03-21 LAB — TSH: TSH: 1.14 u[IU]/mL (ref 0.35–5.50)

## 2013-03-24 DIAGNOSIS — Z23 Encounter for immunization: Secondary | ICD-10-CM | POA: Diagnosis not present

## 2013-03-28 DIAGNOSIS — M9420 Chondromalacia, unspecified site: Secondary | ICD-10-CM | POA: Diagnosis not present

## 2013-03-28 DIAGNOSIS — M25569 Pain in unspecified knee: Secondary | ICD-10-CM | POA: Diagnosis not present

## 2013-03-31 ENCOUNTER — Other Ambulatory Visit (HOSPITAL_COMMUNITY): Payer: Self-pay | Admitting: Orthopaedic Surgery

## 2013-03-31 DIAGNOSIS — M1712 Unilateral primary osteoarthritis, left knee: Secondary | ICD-10-CM

## 2013-04-14 ENCOUNTER — Encounter: Payer: Self-pay | Admitting: Pulmonary Disease

## 2013-04-16 ENCOUNTER — Encounter (HOSPITAL_COMMUNITY): Payer: Self-pay | Admitting: Pharmacy Technician

## 2013-04-18 ENCOUNTER — Encounter (HOSPITAL_COMMUNITY): Payer: Self-pay

## 2013-04-18 ENCOUNTER — Encounter (HOSPITAL_COMMUNITY)
Admission: RE | Admit: 2013-04-18 | Discharge: 2013-04-18 | Disposition: A | Discharge status: Home / Self Care | Payer: Medicare Other | Source: Ambulatory Visit | Attending: Orthopaedic Surgery | Admitting: Orthopaedic Surgery

## 2013-04-18 DIAGNOSIS — Z01812 Encounter for preprocedural laboratory examination: Secondary | ICD-10-CM | POA: Insufficient documentation

## 2013-04-18 DIAGNOSIS — Z01818 Encounter for other preprocedural examination: Secondary | ICD-10-CM | POA: Insufficient documentation

## 2013-04-18 HISTORY — DX: Malignant (primary) neoplasm, unspecified: C80.1

## 2013-04-18 LAB — TYPE AND SCREEN: Antibody Screen: NEGATIVE

## 2013-04-18 LAB — URINALYSIS, ROUTINE W REFLEX MICROSCOPIC
Bilirubin Urine: NEGATIVE
Glucose, UA: NEGATIVE mg/dL
Hgb urine dipstick: NEGATIVE
Leukocytes, UA: NEGATIVE
Specific Gravity, Urine: 1.016 (ref 1.005–1.030)
Urobilinogen, UA: 0.2 mg/dL (ref 0.0–1.0)
pH: 5.5 (ref 5.0–8.0)

## 2013-04-18 LAB — COMPREHENSIVE METABOLIC PANEL
ALT: 20 U/L (ref 0–53)
Albumin: 3.8 g/dL (ref 3.5–5.2)
Alkaline Phosphatase: 74 U/L (ref 39–117)
Chloride: 105 mEq/L (ref 96–112)
Potassium: 4.5 mEq/L (ref 3.5–5.1)
Sodium: 142 mEq/L (ref 135–145)
Total Bilirubin: 0.4 mg/dL (ref 0.3–1.2)
Total Protein: 7.1 g/dL (ref 6.0–8.3)

## 2013-04-18 LAB — SURGICAL PCR SCREEN
MRSA, PCR: NEGATIVE
Staphylococcus aureus: NEGATIVE

## 2013-04-18 LAB — ABO/RH: ABO/RH(D): A POS

## 2013-04-18 LAB — CBC
HCT: 43.1 % (ref 39.0–52.0)
Hemoglobin: 14.9 g/dL (ref 13.0–17.0)
MCHC: 34.6 g/dL (ref 30.0–36.0)
RDW: 12.8 % (ref 11.5–15.5)
WBC: 8.2 10*3/uL (ref 4.0–10.5)

## 2013-04-18 LAB — APTT: aPTT: 31 seconds (ref 24–37)

## 2013-04-18 LAB — PROTIME-INR: Prothrombin Time: 12.6 seconds (ref 11.6–15.2)

## 2013-04-18 NOTE — Pre-Procedure Instructions (Signed)
Russell [name]  04/18/2013   Your procedure is scheduled on:  Wednesday April 30, 2013 at 1230PM  Report to [facility] Main Entrance "A" at 1030 AM.  Call this number if you have problems the morning of surgery: [phone]   Remember:   Do not eat food or drink liquids after midnight Tuesday   Take these medicines the morning of surgery with A SIP OF WATER: None   Stop Aspirin and Vitamins 5 days prior to surgery.  Do not wear jewelry.  Do not wear lotions, or powders You may wear deodorant.             Men may shave face and neck.  Do not bring valuables to the hospital.  [facility] is not responsible for any belongings or valuables.               Contacts, dentures or bridgework may not be worn into surgery.  Leave suitcase in the car. After surgery it may be brought to your room.  For patients admitted to the hospital, discharge time is determined by your  treatment team.               Patients discharged the day of surgery will not be allowed to drive home.    Special Instructions: Incentive Spirometry - Practice and bring it with you on the day of surgery. Shower using CHG 2 nights before surgery and the night before surgery.  If you shower the day of surgery use CHG.  Use special wash - you have one bottle of CHG for all showers.  You should use approximately 1/3 of the bottle for each shower.   Please read over the following fact sheets that you were given: Pain Booklet, Coughing and Deep Breathing, Blood Transfusion Information, Total Joint Packet, MRSA Information and Surgical Site Infection Prevention

## 2013-04-19 DIAGNOSIS — S83006A Unspecified dislocation of unspecified patella, initial encounter: Secondary | ICD-10-CM

## 2013-04-19 HISTORY — DX: Unspecified dislocation of unspecified patella, initial encounter: S83.006A

## 2013-04-22 NOTE — H&P (Signed)
TOTAL KNEE ADMISSION H&P  Patient is being admitted for left total knee arthroplasty.  Subjective:  Chief Complaint:left knee pain.  HPI: Russell [name], 67 y.o. male, has a history of pain and functional disability in the left knee due to arthritis and has failed non-surgical conservative treatments for greater than 12 weeks to includeNSAID's and/or analgesics, corticosteriod injections, supervised PT with diminished ADL's post treatment and use of assistive devices.  Onset of symptoms was gradual, starting 6 years ago with gradually worsening course since that time. The patient noted prior procedures on the knee to include  arthroscopy on the left knee(s).  Patient currently rates pain in the left knee(s) at 7 out of 10 with activity. Patient has worsening of pain with activity and weight bearing, pain that interferes with activities of daily living and crepitus.  Patient has evidence of subchondral sclerosis, periarticular osteophytes and joint space narrowing by imaging studies. 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 There is no active infection.  Patient Active Problem List   Diagnosis Date Noted  . Calcified granuloma of lung 03/19/2013  . Diverticulosis of colon without hemorrhage 03/19/2013  . Physical exam, annual 03/19/2012  . Kidney stone 03/19/2012  . COLONIC POLYPS 06/28/2008  . HYPERCHOLESTEROLEMIA, BORDERLINE 06/28/2008  . SMOKER 06/28/2008  . HEMORRHOIDS 06/28/2008  . ACUTE PROSTATITIS 06/28/2008  . DEGENERATIVE JOINT DISEASE 06/28/2008  . SKIN CANCER, HX OF 06/28/2008   Past Medical History  Diagnosis Date  . Smoker   . Other and unspecified hyperlipidemia   . Hx of colonic polyps   . Hemorrhoids   . Acute prostatitis   . DJD (degenerative joint disease)   . History of skin cancer   . Whole blood donor   . Cancer     basal cell removed     Past Surgical History  Procedure Laterality Date  . Tonsillectomy and adenoidectomy    . Skin cancer removed    . Left knee arthroscopy  03/2007  . 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 Lisette Grinder at [facility]  . Eye surgery      No prescriptions prior to admission   No Known Allergies  History  Substance Use Topics  . Smoking status: Former Smoker -- 30 years    Types: Cigars  . Smokeless tobacco: Never Used     Comment: cigars once weekly  . Alcohol Use: 1.2 oz/week    1 Glasses of wine, 1 Cans of beer per week     Comment: daily use    No family history on file.   Review of Systems  Musculoskeletal: Positive for joint pain.       Left knee  All other systems reviewed and are negative.    Objective:  Physical Exam  Constitutional: He is oriented to person, place, and time. He appears well-developed and well-nourished.  HENT:  Head: Normocephalic and atraumatic.  Eyes: EOM are normal. Pupils are equal, round, and reactive to light.  Neck: Normal range of motion.  Cardiovascular: Normal rate and normal heart sounds.   Respiratory: Effort normal.  GI: Soft.  Musculoskeletal:  No pain with hip range of motion.  There is crepitus with left knee range of motion, 2+ effusion.  The opposite knee shows no crepitus.  Full range of motion, no effusion.  Good stability.  The left knee is in slight  varus of 2 degrees.  Distal pulses are 2+.  The patient reaches within 2 degrees reaching full extension, is able to flex to 95 degrees actively and passively.    Neurological: He is alert and oriented to person, place, and time.  Skin: Skin is warm and dry.  Psychiatric: He has a normal mood and affect.    Vital signs in last 24 hours:    Labs:   Estimated body mass index is 25.60 kg/(m^2) as calculated from the following:   Height as of 03/19/13: 6' (1.829 m).   Weight as of 03/19/13: 85.639 kg (188 lb 12.8 oz).   Imaging Review Plain radiographs demonstrate  severe degenerative joint disease of the left knee(s). The overall alignment ismild varus. The bone quality appears to be adequate for age and reported activity level.  Assessment/Plan:  End stage arthritis, left knee   The patient history, physical examination, clinical judgment of the provider and imaging studies are consistent with end stage degenerative joint disease of the left knee(s) and total knee arthroplasty is deemed medically necessary. The treatment options including medical management, injection therapy arthroscopy and arthroplasty were discussed at length. The risks and benefits of total knee arthroplasty were presented and reviewed. The risks due to aseptic loosening, infection, stiffness, patella tracking problems, thromboembolic complications and other imponderables were discussed. The patient acknowledged the explanation, agreed to proceed with the plan and consent was signed. Patient is being admitted for inpatient treatment for surgery, pain control, PT, OT, prophylactic antibiotics, VTE prophylaxis, progressive ambulation and ADL's and discharge planning. The patient is planning to be discharged home with home health services

## 2013-04-24 ENCOUNTER — Other Ambulatory Visit: Payer: Self-pay

## 2013-04-29 MED ORDER — CEFAZOLIN SODIUM-DEXTROSE 2-3 GM-% IV SOLR
2.0000 g | INTRAVENOUS | Status: AC
  Administered 2013-04-30: 2 g via INTRAVENOUS
  Filled 2013-04-29: qty 50

## 2013-04-30 ENCOUNTER — Encounter (HOSPITAL_COMMUNITY)
Admission: RE | Disposition: A | Discharge status: Home Health | Payer: Self-pay | Source: Ambulatory Visit | Attending: Orthopaedic Surgery

## 2013-04-30 ENCOUNTER — Inpatient Hospital Stay (HOSPITAL_COMMUNITY)
Admission: RE | Admit: 2013-04-30 | Discharge: 2013-05-02 | DRG: 470 | Disposition: A | Discharge status: Home Health | Payer: Medicare Other | Source: Ambulatory Visit | Attending: Orthopaedic Surgery | Admitting: Orthopaedic Surgery

## 2013-04-30 ENCOUNTER — Encounter (HOSPITAL_COMMUNITY): Payer: Self-pay | Admitting: Surgery

## 2013-04-30 ENCOUNTER — Inpatient Hospital Stay (HOSPITAL_COMMUNITY): Payer: Medicare Other | Admitting: Anesthesiology

## 2013-04-30 ENCOUNTER — Encounter (HOSPITAL_COMMUNITY): Payer: Medicare Other | Admitting: Anesthesiology

## 2013-04-30 DIAGNOSIS — Z87442 Personal history of urinary calculi: Secondary | ICD-10-CM | POA: Diagnosis not present

## 2013-04-30 DIAGNOSIS — Z860100 Personal history of colon polyps, unspecified: Secondary | ICD-10-CM

## 2013-04-30 DIAGNOSIS — M1710 Unilateral primary osteoarthritis, unspecified knee: Secondary | ICD-10-CM | POA: Diagnosis not present

## 2013-04-30 DIAGNOSIS — J8410 Pulmonary fibrosis, unspecified: Secondary | ICD-10-CM | POA: Diagnosis not present

## 2013-04-30 DIAGNOSIS — M23329 Other meniscus derangements, posterior horn of medial meniscus, unspecified knee: Secondary | ICD-10-CM | POA: Diagnosis not present

## 2013-04-30 DIAGNOSIS — Z85828 Personal history of other malignant neoplasm of skin: Secondary | ICD-10-CM

## 2013-04-30 DIAGNOSIS — E785 Hyperlipidemia, unspecified: Secondary | ICD-10-CM | POA: Diagnosis present

## 2013-04-30 DIAGNOSIS — K5730 Diverticulosis of large intestine without perforation or abscess without bleeding: Secondary | ICD-10-CM | POA: Diagnosis present

## 2013-04-30 DIAGNOSIS — M1712 Unilateral primary osteoarthritis, left knee: Secondary | ICD-10-CM

## 2013-04-30 DIAGNOSIS — Z87891 Personal history of nicotine dependence: Secondary | ICD-10-CM | POA: Diagnosis not present

## 2013-04-30 DIAGNOSIS — G8918 Other acute postprocedural pain: Secondary | ICD-10-CM | POA: Diagnosis not present

## 2013-04-30 DIAGNOSIS — M25569 Pain in unspecified knee: Secondary | ICD-10-CM | POA: Diagnosis not present

## 2013-04-30 DIAGNOSIS — E7800 Pure hypercholesterolemia, unspecified: Secondary | ICD-10-CM | POA: Diagnosis not present

## 2013-04-30 DIAGNOSIS — M659 Synovitis and tenosynovitis, unspecified: Secondary | ICD-10-CM | POA: Diagnosis not present

## 2013-04-30 DIAGNOSIS — M9420 Chondromalacia, unspecified site: Secondary | ICD-10-CM | POA: Diagnosis not present

## 2013-04-30 HISTORY — PX: KNEE ARTHROPLASTY: SHX992

## 2013-04-30 SURGERY — ARTHROPLASTY, KNEE, TOTAL, USING IMAGELESS COMPUTER-ASSISTED NAVIGATION
Anesthesia: General | Site: Knee | Laterality: Left | Wound class: Clean

## 2013-04-30 MED ORDER — OXYCODONE HCL 5 MG PO TABS: 5.0000 mg | ORAL_TABLET | Freq: Once | ORAL | Status: DC | PRN

## 2013-04-30 MED ORDER — KETOROLAC TROMETHAMINE 15 MG/ML IJ SOLN
15.0000 mg | Freq: Once | INTRAMUSCULAR | Status: AC
  Administered 2013-04-30: 15 mg via INTRAVENOUS
  Filled 2013-04-30: qty 1

## 2013-04-30 MED ORDER — OXYCODONE-ACETAMINOPHEN 5-325 MG PO TABS
ORAL_TABLET | ORAL | Status: AC
  Filled 2013-04-30: qty 2

## 2013-04-30 MED ORDER — MENTHOL 3 MG MT LOZG: 1.0000 | LOZENGE | OROMUCOSAL | Status: DC | PRN

## 2013-04-30 MED ORDER — KCL IN DEXTROSE-NACL 20-5-0.45 MEQ/L-%-% IV SOLN
INTRAVENOUS | Status: DC
  Administered 2013-04-30 – 2013-05-01 (×2): via INTRAVENOUS
  Filled 2013-04-30 (×5): qty 1000

## 2013-04-30 MED ORDER — MIDAZOLAM HCL 2 MG/2ML IJ SOLN: 1.0000 mg | Freq: Once | INTRAMUSCULAR | Status: DC

## 2013-04-30 MED ORDER — HYDROCORTISONE 2.5 % RE CREA
TOPICAL_CREAM | Freq: Two times a day (BID) | RECTAL | Status: DC | PRN
  Filled 2013-04-30: qty 28.35

## 2013-04-30 MED ORDER — PROPOFOL 10 MG/ML IV BOLUS
INTRAVENOUS | Status: DC | PRN
  Administered 2013-04-30: 170 mg via INTRAVENOUS

## 2013-04-30 MED ORDER — FLEET ENEMA 7-19 GM/118ML RE ENEM: 1.0000 | ENEMA | Freq: Once | RECTAL | Status: AC | PRN

## 2013-04-30 MED ORDER — MIDAZOLAM HCL 2 MG/2ML IJ SOLN
INTRAMUSCULAR | Status: AC
  Administered 2013-04-30: 1 mg
  Filled 2013-04-30: qty 2

## 2013-04-30 MED ORDER — METOCLOPRAMIDE HCL 5 MG PO TABS
5.0000 mg | ORAL_TABLET | Freq: Three times a day (TID) | ORAL | Status: DC | PRN
  Filled 2013-04-30: qty 2

## 2013-04-30 MED ORDER — HYDROMORPHONE HCL PF 1 MG/ML IJ SOLN
0.2500 mg | INTRAMUSCULAR | Status: DC | PRN
  Administered 2013-04-30 (×3): 0.5 mg via INTRAVENOUS

## 2013-04-30 MED ORDER — SODIUM CHLORIDE 0.9 % IR SOLN
Status: DC | PRN
  Administered 2013-04-30: 3000 mL

## 2013-04-30 MED ORDER — ZOLPIDEM TARTRATE 5 MG PO TABS: 5.0000 mg | ORAL_TABLET | Freq: Every evening | ORAL | Status: DC | PRN

## 2013-04-30 MED ORDER — FENTANYL CITRATE 0.05 MG/ML IJ SOLN
75.0000 ug | Freq: Once | INTRAMUSCULAR | Status: AC
  Administered 2013-04-30: 75 ug via INTRAVENOUS

## 2013-04-30 MED ORDER — OXYCODONE-ACETAMINOPHEN 5-325 MG PO TABS: 1.0000 | ORAL_TABLET | ORAL | Status: DC | PRN

## 2013-04-30 MED ORDER — METHOCARBAMOL 500 MG PO TABS: 500.0000 mg | ORAL_TABLET | Freq: Four times a day (QID) | ORAL | Status: DC | PRN

## 2013-04-30 MED ORDER — DIPHENHYDRAMINE HCL 12.5 MG/5ML PO ELIX: 12.5000 mg | ORAL_SOLUTION | ORAL | Status: DC | PRN

## 2013-04-30 MED ORDER — ACETAMINOPHEN 325 MG PO TABS: 650.0000 mg | ORAL_TABLET | Freq: Four times a day (QID) | ORAL | Status: DC | PRN

## 2013-04-30 MED ORDER — METHOCARBAMOL 500 MG PO TABS
ORAL_TABLET | ORAL | Status: AC
  Filled 2013-04-30: qty 1

## 2013-04-30 MED ORDER — ONDANSETRON HCL 4 MG/2ML IJ SOLN: 4.0000 mg | Freq: Four times a day (QID) | INTRAMUSCULAR | Status: DC | PRN

## 2013-04-30 MED ORDER — METOCLOPRAMIDE HCL 5 MG/ML IJ SOLN: 5.0000 mg | Freq: Three times a day (TID) | INTRAMUSCULAR | Status: DC | PRN

## 2013-04-30 MED ORDER — SENNOSIDES-DOCUSATE SODIUM 8.6-50 MG PO TABS: 1.0000 | ORAL_TABLET | Freq: Every evening | ORAL | Status: DC | PRN

## 2013-04-30 MED ORDER — METHOCARBAMOL 100 MG/ML IJ SOLN
500.0000 mg | Freq: Four times a day (QID) | INTRAVENOUS | Status: DC | PRN
  Filled 2013-04-30: qty 5

## 2013-04-30 MED ORDER — HYDROCODONE-ACETAMINOPHEN 5-325 MG PO TABS: 1.0000 | ORAL_TABLET | ORAL | Status: DC | PRN

## 2013-04-30 MED ORDER — PROMETHAZINE HCL 25 MG/ML IJ SOLN: 6.2500 mg | INTRAMUSCULAR | Status: DC | PRN

## 2013-04-30 MED ORDER — MIDAZOLAM HCL 5 MG/5ML IJ SOLN
INTRAMUSCULAR | Status: DC | PRN
  Administered 2013-04-30: 2 mg via INTRAVENOUS

## 2013-04-30 MED ORDER — BUPIVACAINE HCL (PF) 0.25 % IJ SOLN
INTRAMUSCULAR | Status: DC | PRN
  Administered 2013-04-30: 20 mL via INTRA_ARTICULAR

## 2013-04-30 MED ORDER — FENTANYL CITRATE 0.05 MG/ML IJ SOLN
INTRAMUSCULAR | Status: AC
  Administered 2013-04-30: 75 ug via INTRAVENOUS
  Filled 2013-04-30: qty 2

## 2013-04-30 MED ORDER — BUPIVACAINE-EPINEPHRINE PF 0.5-1:200000 % IJ SOLN
INTRAMUSCULAR | Status: DC | PRN
  Administered 2013-04-30: 30 mL

## 2013-04-30 MED ORDER — 0.9 % SODIUM CHLORIDE (POUR BTL) OPTIME
TOPICAL | Status: DC | PRN
  Administered 2013-04-30: 1000 mL

## 2013-04-30 MED ORDER — LACTATED RINGERS IV SOLN
INTRAVENOUS | Status: DC
  Administered 2013-04-30: 11:00:00 via INTRAVENOUS

## 2013-04-30 MED ORDER — DOCUSATE SODIUM 100 MG PO CAPS
100.0000 mg | ORAL_CAPSULE | Freq: Two times a day (BID) | ORAL | Status: DC
  Administered 2013-04-30 – 2013-05-02 (×4): 100 mg via ORAL
  Filled 2013-04-30 (×6): qty 1

## 2013-04-30 MED ORDER — BISACODYL 10 MG RE SUPP: 10.0000 mg | Freq: Every day | RECTAL | Status: DC | PRN

## 2013-04-30 MED ORDER — PHENOL 1.4 % MT LIQD: 1.0000 | OROMUCOSAL | Status: DC | PRN

## 2013-04-30 MED ORDER — ONDANSETRON HCL 4 MG PO TABS: 4.0000 mg | ORAL_TABLET | Freq: Four times a day (QID) | ORAL | Status: DC | PRN

## 2013-04-30 MED ORDER — LIDOCAINE HCL (CARDIAC) 20 MG/ML IV SOLN
INTRAVENOUS | Status: DC | PRN
  Administered 2013-04-30: 80 mg via INTRAVENOUS

## 2013-04-30 MED ORDER — ACETAMINOPHEN 650 MG RE SUPP: 650.0000 mg | Freq: Four times a day (QID) | RECTAL | Status: DC | PRN

## 2013-04-30 MED ORDER — ASPIRIN EC 325 MG PO TBEC: 325.0000 mg | DELAYED_RELEASE_TABLET | Freq: Every day | ORAL | Status: DC

## 2013-04-30 MED ORDER — LACTATED RINGERS IV SOLN
INTRAVENOUS | Status: DC | PRN
  Administered 2013-04-30 (×2): via INTRAVENOUS

## 2013-04-30 MED ORDER — MORPHINE SULFATE 2 MG/ML IJ SOLN: 2.0000 mg | INTRAMUSCULAR | Status: DC | PRN

## 2013-04-30 MED ORDER — ASPIRIN EC 325 MG PO TBEC
325.0000 mg | DELAYED_RELEASE_TABLET | Freq: Every day | ORAL | Status: DC
  Administered 2013-05-01 – 2013-05-02 (×2): 325 mg via ORAL
  Filled 2013-04-30 (×3): qty 1

## 2013-04-30 MED ORDER — METHOCARBAMOL 500 MG PO TABS
500.0000 mg | ORAL_TABLET | Freq: Four times a day (QID) | ORAL | Status: DC | PRN
  Administered 2013-04-30 – 2013-05-02 (×4): 500 mg via ORAL
  Filled 2013-04-30 (×3): qty 1

## 2013-04-30 MED ORDER — FENTANYL CITRATE 0.05 MG/ML IJ SOLN
INTRAMUSCULAR | Status: DC | PRN
  Administered 2013-04-30: 100 ug via INTRAVENOUS
  Administered 2013-04-30 (×5): 50 ug via INTRAVENOUS

## 2013-04-30 MED ORDER — ONDANSETRON HCL 4 MG/2ML IJ SOLN
INTRAMUSCULAR | Status: DC | PRN
  Administered 2013-04-30: 4 mg via INTRAVENOUS

## 2013-04-30 MED ORDER — OXYCODONE HCL 5 MG/5ML PO SOLN
5.0000 mg | Freq: Once | ORAL | Status: DC | PRN
Start: 2013-04-30 — End: 2013-04-30

## 2013-04-30 MED ORDER — OXYCODONE-ACETAMINOPHEN 5-325 MG PO TABS
1.0000 | ORAL_TABLET | ORAL | Status: DC | PRN
  Administered 2013-04-30: 1 via ORAL
  Administered 2013-04-30: 2 via ORAL
  Administered 2013-05-01 (×5): 1 via ORAL
  Administered 2013-05-02 (×2): 2 via ORAL
  Administered 2013-05-02: 1 via ORAL
  Administered 2013-05-02: 2 via ORAL
  Administered 2013-05-02: 1 via ORAL
  Filled 2013-04-30 (×2): qty 1
  Filled 2013-04-30: qty 2
  Filled 2013-04-30: qty 1
  Filled 2013-04-30 (×2): qty 2
  Filled 2013-04-30 (×5): qty 1
  Filled 2013-04-30: qty 2

## 2013-04-30 MED ORDER — HYDROMORPHONE HCL PF 1 MG/ML IJ SOLN
INTRAMUSCULAR | Status: AC
  Filled 2013-04-30: qty 1

## 2013-04-30 MED ORDER — SIMVASTATIN 40 MG PO TABS
40.0000 mg | ORAL_TABLET | Freq: Every evening | ORAL | Status: DC
  Administered 2013-04-30 – 2013-05-01 (×2): 40 mg via ORAL
  Filled 2013-04-30 (×3): qty 1

## 2013-04-30 SURGICAL SUPPLY — 71 items
APL SKNCLS STERI-STRIP NONHPOA (GAUZE/BANDAGES/DRESSINGS) ×1
BANDAGE ELASTIC 4 VELCRO ST LF (GAUZE/BANDAGES/DRESSINGS) ×2 IMPLANT
BANDAGE ELASTIC 6 VELCRO ST LF (GAUZE/BANDAGES/DRESSINGS) ×2 IMPLANT
BANDAGE ESMARK 6X9 LF (GAUZE/BANDAGES/DRESSINGS) ×1 IMPLANT
BENZOIN TINCTURE PRP APPL 2/3 (GAUZE/BANDAGES/DRESSINGS) ×2 IMPLANT
BLADE SAGITTAL 25.0X1.19X90 (BLADE) ×2 IMPLANT
BLADE SAW SGTL 13X75X1.27 (BLADE) ×2 IMPLANT
BLADE SURG 10 STRL SS (BLADE) ×1 IMPLANT
BNDG CMPR 9X6 STRL LF SNTH (GAUZE/BANDAGES/DRESSINGS) ×1
BNDG CMPR MED 10X6 ELC LF (GAUZE/BANDAGES/DRESSINGS) ×1
BNDG ELASTIC 6X10 VLCR STRL LF (GAUZE/BANDAGES/DRESSINGS) ×2 IMPLANT
BNDG ESMARK 6X9 LF (GAUZE/BANDAGES/DRESSINGS) ×2
BOWL SMART MIX CTS (DISPOSABLE) ×2 IMPLANT
CAPT RP KNEE ×1 IMPLANT
CEMENT HV SMART SET (Cement) ×4 IMPLANT
CLOTH BEACON ORANGE TIMEOUT ST (SAFETY) ×2 IMPLANT
CLSR STERI-STRIP ANTIMIC 1/2X4 (GAUZE/BANDAGES/DRESSINGS) ×1 IMPLANT
COVER BACK TABLE 24X17X13 BIG (DRAPES) IMPLANT
COVER SURGICAL LIGHT HANDLE (MISCELLANEOUS) ×2 IMPLANT
CUFF TOURNIQUET SINGLE 34IN LL (TOURNIQUET CUFF) ×2 IMPLANT
CUFF TOURNIQUET SINGLE 44IN (TOURNIQUET CUFF) IMPLANT
DRAPE ORTHO SPLIT 77X108 STRL (DRAPES) ×4
DRAPE SURG ORHT 6 SPLT 77X108 (DRAPES) ×2 IMPLANT
DRAPE U-SHAPE 47X51 STRL (DRAPES) ×2 IMPLANT
DRSG PAD ABDOMINAL 8X10 ST (GAUZE/BANDAGES/DRESSINGS) ×3 IMPLANT
DURAPREP 26ML APPLICATOR (WOUND CARE) ×2 IMPLANT
ELECT REM PT RETURN 9FT ADLT (ELECTROSURGICAL) ×2
ELECTRODE REM PT RTRN 9FT ADLT (ELECTROSURGICAL) ×1 IMPLANT
FACESHIELD LNG OPTICON STERILE (SAFETY) ×4 IMPLANT
GAUZE XEROFORM 5X9 LF (GAUZE/BANDAGES/DRESSINGS) ×2 IMPLANT
GLOVE BIOGEL PI IND STRL 7.5 (GLOVE) ×1 IMPLANT
GLOVE BIOGEL PI IND STRL 8 (GLOVE) ×1 IMPLANT
GLOVE BIOGEL PI INDICATOR 7.5 (GLOVE) ×1
GLOVE BIOGEL PI INDICATOR 8 (GLOVE) ×1
GLOVE ECLIPSE 7.0 STRL STRAW (GLOVE) ×2 IMPLANT
GLOVE ORTHO TXT STRL SZ7.5 (GLOVE) ×2 IMPLANT
GOWN PREVENTION PLUS LG XLONG (DISPOSABLE) ×2 IMPLANT
GOWN PREVENTION PLUS XLARGE (GOWN DISPOSABLE) ×2 IMPLANT
GOWN STRL NON-REIN LRG LVL3 (GOWN DISPOSABLE) ×6 IMPLANT
HANDPIECE INTERPULSE COAX TIP (DISPOSABLE) ×2
IMMOBILIZER KNEE 22 UNIV (SOFTGOODS) ×2 IMPLANT
KIT BASIN OR (CUSTOM PROCEDURE TRAY) ×2 IMPLANT
KIT ROOM TURNOVER OR (KITS) ×2 IMPLANT
MANIFOLD NEPTUNE II (INSTRUMENTS) ×2 IMPLANT
MARKER SPHERE PSV REFLC THRD 5 (MARKER) ×6 IMPLANT
NDL 1/2 CIR MAYO (NEEDLE) ×1 IMPLANT
NDL HYPO 25GX1X1/2 BEV (NEEDLE) ×1 IMPLANT
NEEDLE 1/2 CIR MAYO (NEEDLE) IMPLANT
NEEDLE HYPO 25GX1X1/2 BEV (NEEDLE) ×2 IMPLANT
NS IRRIG 1000ML POUR BTL (IV SOLUTION) ×2 IMPLANT
PACK TOTAL JOINT (CUSTOM PROCEDURE TRAY) ×2 IMPLANT
PAD ARMBOARD 7.5X6 YLW CONV (MISCELLANEOUS) ×4 IMPLANT
PAD CAST 4YDX4 CTTN HI CHSV (CAST SUPPLIES) ×1 IMPLANT
PADDING CAST COTTON 4X4 STRL (CAST SUPPLIES) ×2
PADDING CAST COTTON 6X4 STRL (CAST SUPPLIES) ×2 IMPLANT
PIN SCHANZ 4MM 130MM (PIN) ×8 IMPLANT
SET HNDPC FAN SPRY TIP SCT (DISPOSABLE) ×1 IMPLANT
SPONGE GAUZE 4X4 12PLY (GAUZE/BANDAGES/DRESSINGS) ×2 IMPLANT
STAPLER VISISTAT 35W (STAPLE) ×2 IMPLANT
STRIP CLOSURE SKIN 1/2X4 (GAUZE/BANDAGES/DRESSINGS) ×4 IMPLANT
SUCTION FRAZIER TIP 10 FR DISP (SUCTIONS) ×2 IMPLANT
SUT ETHIBOND NAB CT1 #1 30IN (SUTURE) ×2 IMPLANT
SUT VIC AB 2-0 CT1 27 (SUTURE) ×4
SUT VIC AB 2-0 CT1 TAPERPNT 27 (SUTURE) ×2 IMPLANT
SUT VICRYL 4-0 PS2 18IN ABS (SUTURE) ×2 IMPLANT
SUT VICRYL AB 2 0 TIES (SUTURE) ×2 IMPLANT
SYR CONTROL 10ML LL (SYRINGE) ×2 IMPLANT
TOWEL OR 17X24 6PK STRL BLUE (TOWEL DISPOSABLE) ×2 IMPLANT
TOWEL OR 17X26 10 PK STRL BLUE (TOWEL DISPOSABLE) ×2 IMPLANT
TRAY FOLEY CATH 16FRSI W/METER (SET/KITS/TRAYS/PACK) ×2 IMPLANT
WATER STERILE IRR 1000ML POUR (IV SOLUTION) IMPLANT

## 2013-04-30 NOTE — Anesthesia Preprocedure Evaluation (Addendum)
Anesthesia Evaluation  Patient identified by MRN, date of birth, ID band Patient awake    Reviewed: Allergy & Precautions, H&P , NPO status , Patient's Chart, lab work & pertinent test results, reviewed documented beta blocker date and time   History of Anesthesia Complications Negative for: history of anesthetic complications  Airway Mallampati: II  Neck ROM: Full    Dental  (+) Teeth Intact and Dental Advisory Given   Pulmonary former smoker,  breath sounds clear to auscultation        Cardiovascular negative cardio ROS  Rhythm:Regular Rate:Normal     Neuro/Psych    GI/Hepatic negative GI ROS, Neg liver ROS,   Endo/Other  negative endocrine ROS  Renal/GU      Musculoskeletal negative musculoskeletal ROS (+) Arthritis -, Osteoarthritis,    Abdominal   Peds  Hematology   Anesthesia Other Findings   Reproductive/Obstetrics                         Anesthesia Physical Anesthesia Plan  ASA: II  Anesthesia Plan: General   Post-op Pain Management:    Induction:   Airway Management Planned: LMA  Additional Equipment:   Intra-op Plan:   Post-operative Plan: Extubation in OR  Informed Consent: I have reviewed the patients History and Physical, chart, labs and discussed the procedure including the risks, benefits and alternatives for the proposed anesthesia with the patient or authorized representative who has indicated his/her understanding and acceptance.   Dental advisory given  Plan Discussed with: CRNA, Surgeon and Anesthesiologist  Anesthesia Plan Comments:        Anesthesia Quick Evaluation

## 2013-04-30 NOTE — Transfer of Care (Signed)
Immediate Anesthesia Transfer of Care Note  Patient: Russell [name]  Procedure(s) Performed: Procedure(s) with comments: COMPUTER ASSISTED TOTAL KNEE ARTHROPLASTY (Left) - Left Cemented Total Knee Arthroplasty  Patient Location: PACU  Anesthesia Type:GA combined with regional for post-op pain  Level of Consciousness: awake and alert   Airway & Oxygen Therapy: Patient Spontanous Breathing and Patient connected to face mask oxygen  Post-op Assessment: Report given to PACU RN and Post -op Vital signs reviewed and stable  Post vital signs: Reviewed and stable  Complications: No apparent anesthesia complications

## 2013-04-30 NOTE — Brief Op Note (Cosign Needed)
04/30/2013  3:35 PM  PATIENT:  Russell [name]  67 y.o. male  PRE-OPERATIVE DIAGNOSIS:  Left Knee Osteoarthritis  POST-OPERATIVE DIAGNOSIS:  Left Knee Osteoarthritis  PROCEDURE:  Procedure(s) with comments: COMPUTER ASSISTED TOTAL KNEE ARTHROPLASTY (Left) - Left Cemented Total Knee Arthroplasty  SURGEON:  Surgeon(s) and Role:    * Eldred Manges, MD - Primary  PHYSICIAN ASSISTANT:Russell [name] PAC   ASSISTANTS: none   ANESTHESIA:   local, regional and general  EBL:  Total I/O In: 1000 [I.V.:1000] Out: 500 [Urine:400; Blood:100]  BLOOD ADMINISTERED:none  DRAINS: none   LOCAL MEDICATIONS USED:  MARCAINE     SPECIMEN:  No Specimen  DISPOSITION OF SPECIMEN:  N/A  COUNTS:  YES  TOURNIQUET:   Total Tourniquet Time Documented: Thigh (Left) - 99 minutes Total: Thigh (Left) - 99 minutes   DICTATION: .Note written in EPIC  PLAN OF CARE: Admit to inpatient   PATIENT DISPOSITION:  PACU - hemodynamically stable.   Delay start of Pharmacological VTE agent (>24hrs) due to surgical blood loss or risk of bleeding: no

## 2013-04-30 NOTE — Progress Notes (Signed)
Orthopedic Tech Progress Note Patient Details:  Russell [name] [birth year] [id number]  Patient ID: [name], male   DOB: Feb 16, 1946, 67 y.o.   MRN: [id number] Viewed order from doctor's order list  Nikki Dom 04/30/2013, 4:10 PM

## 2013-04-30 NOTE — Progress Notes (Signed)
Orthopedic Tech Progress Note Patient Details:  Russell [name] Aug 29, 1945 [id number]  CPM Left Knee CPM Left Knee: On Left Knee Flexion (Degrees): 60 Left Knee Extension (Degrees): 0 Additional Comments: trapeze bar patient helper   Nikki Dom 04/30/2013, 4:10 PM

## 2013-04-30 NOTE — Anesthesia Procedure Notes (Addendum)
Anesthesia Regional Block:  Femoral nerve block  Pre-Anesthetic Checklist: ,, timeout performed, Correct Patient, Correct Site, Correct Laterality, Correct Procedure, Correct Position, site marked, Risks and benefits discussed, at surgeon's request and post-op pain management  Laterality: Left and Upper  Prep: chloraprep       Needles:  Injection technique: Single-shot  Needle Type: Echogenic Needle      Needle Gauge: 22 and 22 G  Needle insertion depth: 6 cm   Additional Needles:  Procedures: ultrasound guided (picture in chart) and nerve stimulator Femoral nerve block  Nerve Stimulator or Paresthesia:  Response: Twitch elicited, 0.8 mA,   Additional Responses:   Narrative:  Start time: 04/30/2013 11:40 AM End time: 04/30/2013 11:55 AM Injection made incrementally with aspirations every 5 mL.  Performed by: Personally  Anesthesiologist: Alma Friendly, MD  Additional Notes: BP cuff, EKG monitors applied. Sedation begun. Femoral artery palpated for location of nerve. After nerve location anesthetic injected incrementally, slowly , and after neg aspirations. Tolerated well.  Femoral nerve block Procedure Name: LMA Insertion Date/Time: 04/30/2013 12:58 PM Performed by: Brien Mates D Pre-anesthesia Checklist: Patient identified, Emergency Drugs available, Suction available, Patient being monitored and Timeout performed Patient Re-evaluated:Patient Re-evaluated prior to inductionOxygen Delivery Method: Circle system utilized Preoxygenation: Pre-oxygenation with 100% oxygen Intubation Type: IV induction Ventilation: Mask ventilation without difficulty LMA: LMA inserted LMA Size: 4.0 Tube type: Oral Number of attempts: 1 Placement Confirmation: positive ETCO2 and breath sounds checked- equal and bilateral Tube secured with: Tape Dental Injury: Teeth and Oropharynx as per pre-operative assessment

## 2013-05-01 LAB — BASIC METABOLIC PANEL
CO2: 24 mEq/L (ref 19–32)
Chloride: 99 mEq/L (ref 96–112)
Creatinine, Ser: 0.82 mg/dL (ref 0.50–1.35)
Glucose, Bld: 123 mg/dL — ABNORMAL HIGH (ref 70–99)
Sodium: 135 mEq/L (ref 135–145)

## 2013-05-01 LAB — CBC
Hemoglobin: 12 g/dL — ABNORMAL LOW (ref 13.0–17.0)
MCV: 96.5 fL (ref 78.0–100.0)
Platelets: 198 10*3/uL (ref 150–400)
RBC: 3.67 MIL/uL — ABNORMAL LOW (ref 4.22–5.81)
WBC: 10.1 10*3/uL (ref 4.0–10.5)

## 2013-05-01 NOTE — Anesthesia Postprocedure Evaluation (Signed)
  Anesthesia Post-op Note  Patient: Russell [name]  Procedure(s) Performed: Procedure(s) with comments: COMPUTER ASSISTED TOTAL KNEE ARTHROPLASTY (Left) - Left Cemented Total Knee Arthroplasty  Patient Location: Nursing Unit  Anesthesia Type:General  Level of Consciousness: awake, alert  and oriented  Airway and Oxygen Therapy: Patient Spontanous Breathing  Post-op Pain: none  Post-op Assessment: Post-op Vital signs reviewed, Patient's Cardiovascular Status Stable, Respiratory Function Stable, Patent Airway, No signs of Nausea or vomiting, Adequate PO intake and Pain level controlled  Post-op Vital Signs: Reviewed and stable  Complications: No apparent anesthesia complications

## 2013-05-01 NOTE — Evaluation (Signed)
Physical Therapy Evaluation Patient Details Name: Russell [name] MRN: [id number] DOB: March 17, 1946 Today's Date: 05/01/2013 Time: 9562-1308 PT Time Calculation (min): 24 min  PT Assessment / Plan / Recommendation History of Present Illness  Pt is 67yo M s/p LTKA  Clinical Impression  This patient underwent a left TKA and presents to PT with anticipated post-op decrease in strength and ROM, decreased functional mobility and gait.  Pt. Will benefit from acute PT to address these and below issues.  Pt. Made good progress first time up and expect he will meet goals for DC home tomorrow.    PT Assessment  Patient needs continued PT services    Follow Up Recommendations  Home health PT;Supervision/Assistance - 24 hour    Does the patient have the potential to tolerate intense rehabilitation      Barriers to Discharge        Equipment Recommendations  Rolling walker with 5" wheels    Recommendations for Other Services     Frequency 7X/week    Precautions / Restrictions Precautions Precautions: Knee Precaution Booklet Issued: Yes (comment) Precaution Comments: Pt. provided with TKA exercise sheet with precautions on back page.  Pt was educated on no pillow under knee and on ankle pumps and quad sets Required Braces or Orthoses: Knee Immobilizer - Left Knee Immobilizer - Left: Other (comment) (until DC'd) Restrictions Weight Bearing Restrictions: Yes LLE Weight Bearing: Weight bearing as tolerated   Pertinent Vitals/Pain See vitals tab       Mobility  Bed Mobility Bed Mobility: Supine to Sit Supine to Sit: 5: Supervision;HOB flat Sit to Supine: 5: Supervision Details for Bed Mobility Assistance: bed flat, no rails Transfers Transfers: Sit to Stand;Stand to Sit Sit to Stand: 4: Min guard;With upper extremity assist;From bed Stand to Sit: 4: Min guard;With upper extremity assist;With armrests;To chair/3-in-1 Details for Transfer Assistance: cues for correct hand placement  and technique, min guard assist for safety and stability Ambulation/Gait Ambulation/Gait Assistance: 4: Min assist Ambulation Distance (Feet): 60 Feet Assistive device: Rolling walker Ambulation/Gait Assistance Details: Second person to push recliner behind pt. only; min assist for safety and staiblity.  Initial cues for sequencing and RW distance as welll as to rooll walker instead of pick it up Gait Pattern: Step-to pattern    Exercises Total Joint Exercises Ankle Circles/Pumps: AROM;Both;10 reps;Seated Quad Sets: AROM;Both;10 reps;Seated   PT Diagnosis: Difficulty walking;Acute pain;Abnormality of gait  PT Problem List: Decreased strength;Decreased range of motion;Decreased activity tolerance;Decreased mobility;Decreased knowledge of use of DME;Pain;Decreased knowledge of precautions PT Treatment Interventions: DME instruction;Gait training;Functional mobility training;Therapeutic activities;Therapeutic exercise;Patient/family education     PT Goals(Current goals can be found in the care plan section) Acute Rehab PT Goals Patient Stated Goal: Return home; increase ROM; decrease pain PT Goal Formulation: With patient Time For Goal Achievement: 05/08/13 Potential to Achieve Goals: Good  Visit Information  Last PT Received On: 05/01/13 Assistance Needed: +1 History of Present Illness: Pt is 67yo M s/p LTKA       Prior Functioning  Home Living Family/patient expects to be discharged to:: Private residence Living Arrangements: Spouse/significant other Available Help at Discharge: Family;Available 24 hours/day Type of Home: House Home Access: Level entry Home Layout: One level Home Equipment: Cane - quad;Crutches Additional Comments: has borrowed walker, unsure if rolling or standard Prior Function Level of Independence: Independent Communication Communication: No difficulties    Cognition  Cognition Arousal/Alertness: Awake/alert Behavior During Therapy: WFL for tasks  assessed/performed Overall Cognitive Status: Within Functional Limits for tasks assessed  Extremity/Trunk Assessment Upper Extremity Assessment Upper Extremity Assessment: Overall WFL for tasks assessed Lower Extremity Assessment Lower Extremity Assessment: LLE deficits/detail LLE Deficits / Details: good ankle pump and fair quad set Cervical / Trunk Assessment Cervical / Trunk Assessment: Normal   Balance    End of Session PT - End of Session Equipment Utilized During Treatment: Gait belt;Left knee immobilizer Activity Tolerance: Patient tolerated treatment well Patient left: in chair;with call bell/phone within reach Nurse Communication: Mobility status CPM Left Knee CPM Left Knee: Off  GP     Ferman Hamming 05/01/2013, 1:36 PM [name] PT Acute Rehab Services [phone] [facility] [phone]

## 2013-05-01 NOTE — Plan of Care (Signed)
Problem: Consults Goal: Diagnosis- Total Joint Replacement Outcome: Completed/Met Date Met:  05/01/13 Primary Total Knee  Left Total Knee

## 2013-05-01 NOTE — Progress Notes (Signed)
UR COMPLETED  

## 2013-05-01 NOTE — Progress Notes (Signed)
Physical Therapy Treatment Patient Details Name: Russell [name] MRN: [id number] DOB: [birth year] Today's Date: 05/01/2013 Time: 4098-1191 PT Time Calculation (min): 42 min  PT Assessment / Plan / Recommendation  History of Present Illness Pt is 67yo M s/p LTKA   PT Comments   Pt. Is compliant with knee exercises as instructed and is very motivated to return to independence but this is balanced with good safety awareness.  Progressing well.  Expect he will meet goals during am session tomorrow.  Still with significant extensor lag of at least 40 degrees, possibly due to lingering effects of nerve block.  Pt. Is doing his knee presses independently.  Follow Up Recommendations  Home health PT;Supervision/Assistance - 24 hour     Does the patient have the potential to tolerate intense rehabilitation     Barriers to Discharge        Equipment Recommendations  Rolling walker with 5" wheels    Recommendations for Other Services    Frequency 7X/week   Progress towards PT Goals Progress towards PT goals: Progressing toward goals  Plan Current plan remains appropriate    Precautions / Restrictions Precautions Precautions: Knee Precaution Booklet Issued: Yes (comment) Precaution Comments: reviewed SAQ, SLR, seated knee flexion and assisted knee flex Required Braces or Orthoses: Knee Immobilizer - Left Knee Immobilizer - Left: Other (comment) (until DC'd) Restrictions Weight Bearing Restrictions: Yes LLE Weight Bearing: Weight bearing as tolerated   Pertinent Vitals/Pain See vitals tab    Mobility  Bed Mobility Bed Mobility: Sit to Supine Supine to Sit: 5: Supervision;HOB flat Sit to Supine: 5: Supervision Details for Bed Mobility Assistance: bed flat, no rails Transfers Transfers: Sit to Stand;Stand to Sit Sit to Stand: 4: Min guard;From chair/3-in-1;With upper extremity assist;With armrests Stand to Sit: 4: Min guard;To bed;With upper extremity assist Details for Transfer  Assistance: cues for correct hand placement and technique, min guard assist for safety and stability Ambulation/Gait Ambulation/Gait Assistance: 4: Min guard Ambulation Distance (Feet): 250 Feet Assistive device: Rolling walker Ambulation/Gait Assistance Details: one reminder for correct RW distance (he tends to place RW too far forward at times) Gait Pattern: Step-to pattern    Exercises Total Joint Exercises Ankle Circles/Pumps: AROM;Both;10 reps;Seated Quad Sets: AROM;Both;10 reps;Seated Short Arc Quad: AAROM;Both;10 reps;Supine Hip ABduction/ADduction: AROM;Left;10 reps;Supine Straight Leg Raises: AAROM;Left;5 reps;Supine Long Arc Quad: AAROM;Left;5 reps;Supine Knee Flexion: AROM;AAROM;Left;10 reps;Seated Goniometric ROM: 0 to 85   PT Diagnosis: Difficulty walking;Acute pain;Abnormality of gait  PT Problem List: Decreased strength;Decreased range of motion;Decreased activity tolerance;Decreased mobility;Decreased knowledge of use of DME;Pain;Decreased knowledge of precautions PT Treatment Interventions: DME instruction;Gait training;Functional mobility training;Therapeutic activities;Therapeutic exercise;Patient/family education   PT Goals (current goals can now be found in the care plan section) Acute Rehab PT Goals Patient Stated Goal: Return home; increase ROM; decrease pain PT Goal Formulation: With patient Time For Goal Achievement: 05/08/13 Potential to Achieve Goals: Good  Visit Information  Last PT Received On: 05/01/13 Assistance Needed: +1 History of Present Illness: Pt is 67yo M s/p LTKA    Subjective Data  Subjective: "I'd like to take a nap when I get back in the bed" Patient Stated Goal: Return home; increase ROM; decrease pain   Cognition  Cognition Arousal/Alertness: Awake/alert Behavior During Therapy: WFL for tasks assessed/performed Overall Cognitive Status: Within Functional Limits for tasks assessed    Balance     End of Session PT - End of  Session Equipment Utilized During Treatment: Gait belt;Left knee immobilizer Activity Tolerance: Patient tolerated treatment well Patient  left: in bed;with call bell/phone within reach Nurse Communication: Mobility status   GP     Ferman Hamming 05/01/2013, 3:19 PM [name] PT Acute Rehab Services [phone] [facility] [phone]

## 2013-05-01 NOTE — Evaluation (Signed)
Occupational Therapy Evaluation Patient Details Name: Russell [name] MRN: [id number] DOB: [birth year] Today's Date: 05/01/2013 Time: 0865-7846 OT Time Calculation (min): 28 min  OT Assessment / Plan / Recommendation History of present illness Pt is 67yo M s/p LTKA   Clinical Impression   Pt is 67yo M s/p LTKA. PLOF: (I) home w/wife. Wife works outside the home but will stay with pt as long as need be. Pt eager to work w/OT. Pt did have SLR x5 but felt like "knee was going to buckle" during weightbearing. Pt able to amb w/o KI to/from BR. Needed cues for hand placement and LE sequencing 2/2 novel task. Pt w/good problem solving skills. Well motivated to regain fx'l (I). Pt able to doff/don sock sitting EOB w/o AE. Anticipate no OT follow-up and no DME as pt able to transfer on/off reg ht commode this date w/min guard/supervision.     OT Assessment  Patient needs continued OT Services    Follow Up Recommendations  No OT follow up;Supervision/Assistance - 24 hour    Barriers to Discharge      Equipment Recommendations  None recommended by OT    Recommendations for Other Services    Frequency  Min 2X/week    Precautions / Restrictions Precautions Precautions: None Precaution Comments: WBAT LLE Required Braces or Orthoses: Knee Immobilizer - Left (til SLR ) Restrictions LLE Weight Bearing: Weight bearing as tolerated   Pertinent Vitals/Pain 5/10    ADL  Lower Body Dressing: Min guard Where Assessed - Lower Body Dressing: Unsupported sitting Toilet Transfer: Min guard Toilet Transfer Method: Sit to stand;Stand pivot Acupuncturist: Regular height toilet Toileting - Clothing Manipulation and Hygiene: Simulated;Min guard Where Assessed - Glass blower/designer Manipulation and Hygiene: Sit to stand from 3-in-1 or toilet Transfers/Ambulation Related to ADLs: Min guard amb w/RW to/from BR ADL Comments: able to don/doff L sock w/o AE; inc effort    OT Diagnosis:    OT  Problem List: Decreased activity tolerance;Decreased range of motion;Decreased knowledge of use of DME or AE OT Treatment Interventions: Self-care/ADL training;DME and/or AE instruction;Patient/family education;Therapeutic activities   OT Goals(Current goals can be found in the care plan section) Acute Rehab OT Goals Patient Stated Goal: Return home; increase ROM; decrease pain OT Goal Formulation: With patient ADL Goals Pt Will Perform Lower Body Dressing: with modified independence;with adaptive equipment (as needed) Pt Will Transfer to Toilet: with modified independence;regular height toilet Pt Will Perform Toileting - Clothing Manipulation and hygiene: Independently  Visit Information  Last OT Received On: 05/01/13 Assistance Needed: +1 History of Present Illness: Pt is 67yo M s/p LTKA       Prior Functioning     Home Living Family/patient expects to be discharged to:: Private residence Living Arrangements: Spouse/significant other Available Help at Discharge: Family;Available 24 hours/day Type of Home: House Home Access: Level entry Home Layout: One level Home Equipment: Cane - quad;Crutches Additional Comments: has borrowed walker, unsure if rolling or standard Prior Function Level of Independence: Independent         Vision/Perception     Cognition  Cognition Arousal/Alertness: Awake/alert Behavior During Therapy: WFL for tasks assessed/performed Overall Cognitive Status: Within Functional Limits for tasks assessed    Extremity/Trunk Assessment Upper Extremity Assessment Upper Extremity Assessment: Overall WFL for tasks assessed Lower Extremity Assessment Lower Extremity Assessment: Defer to PT evaluation Cervical / Trunk Assessment Cervical / Trunk Assessment: Normal     Mobility Bed Mobility Bed Mobility: Sit to Supine;Supine to Sit Supine to Sit:  5: Supervision Sit to Supine: 5: Supervision Details for Bed Mobility Assistance: bed flat, no  rails Transfers Transfers: Sit to Stand;Stand to Sit Sit to Stand: 4: Min guard Stand to Sit: 4: Min guard     Exercise     Balance     End of Session CPM Left Knee CPM Left Knee: Off  GO     Dalissa Lovin, Deidre Ala 05/01/2013, 12:22 PM

## 2013-05-01 NOTE — Progress Notes (Signed)
Subjective: 1 Day Post-Op Procedure(s) (LRB): COMPUTER ASSISTED TOTAL KNEE ARTHROPLASTY (Left) Patient reports pain as moderate.    Objective: Vital signs in last 24 hours: Temp:  [97.2 F (36.2 C)-98.8 F (37.1 C)] 98.5 F (36.9 C) (11/13 0529) Pulse Rate:  [49-71] 67 (11/13 0529) Resp:  [7-18] 16 (11/13 0529) BP: (99-175)/(59-98) 104/59 mmHg (11/13 0529) SpO2:  [94 %-100 %] 97 % (11/13 0529)  Intake/Output from previous day: 11/12 0701 - 11/13 0700 In: 1360 [P.O.:360; I.V.:1000] Out: 1200 [Urine:1100; Blood:100] Intake/Output this shift:     Recent Labs  05/01/13 0540  HGB 12.0*    Recent Labs  05/01/13 0540  WBC 10.1  RBC 3.67*  HCT 35.4*  PLT 198    Recent Labs  05/01/13 0540  NA 135  K 4.1  CL 99  CO2 24  BUN 19  CREATININE 0.82  GLUCOSE 123*  CALCIUM 8.2*   No results found for this basename: LABPT, INR,  in the last 72 hours  Neurologically intact  Assessment/Plan: 1 Day Post-Op Procedure(s) (LRB): COMPUTER ASSISTED TOTAL KNEE ARTHROPLASTY (Left) Up with therapy  Hutch Rhett C 05/01/2013, 7:49 AM

## 2013-05-01 NOTE — Progress Notes (Signed)
05/01/13 Spoke with patient about HHC. He chose [facility]. Contacted Maddon Horton at [facility] and set up HHPT. Patient states that he has a walker, thinks  it has wheels. He will let me know in am if he needs rolling walker. Will follow up with patient in am. Jacquelynn Cree RN, BSN, CCM

## 2013-05-02 LAB — CBC
HCT: 33.2 % — ABNORMAL LOW (ref 39.0–52.0)
MCHC: 34 g/dL (ref 30.0–36.0)
MCV: 96.2 fL (ref 78.0–100.0)
RDW: 13 % (ref 11.5–15.5)
WBC: 10.7 10*3/uL — ABNORMAL HIGH (ref 4.0–10.5)

## 2013-05-02 NOTE — Op Note (Signed)
NAME:  Russell [name], SABIC NO.:  [id number]  MEDICAL RECORD NO.:  [id number]  LOCATION:  5N30C                        FACILITY:  [facility]  PHYSICIAN:  Darden Flemister C. Ophelia Charter, M.D.    DATE OF BIRTH:  1945/10/12  DATE OF PROCEDURE:  04/30/2013 DATE OF DISCHARGE:                              OPERATIVE REPORT   PREOPERATIVE DIAGNOSIS:  Left knee osteoarthritis.  POSTOPERATIVE DIAGNOSIS:  Left knee osteoarthritis.  PROCEDURE:  Left cemented total knee arthroplasty, computer assist.  SURGEON:  [place] Stringfield C. Ophelia Charter, M.D.  ASSISTANT:  Maud Deed, PA-C, medically necessary and present for the entire procedure.  ANESTHESIA:  GOT plus preoperative femoral nerve block and Marcaine local.  TOURNIQUET TIME:  One hour and 36 minutes.  ESTIMATED BLOOD LOSS:  Less than 100 mL.  COMPLICATIONS:  None.  IMPLANTS:  DePuy LCS rotating platform #4 femur, #4 10-mm rotating platform, #4 tibia, and 38 mm 3 peg all poly patella.  PROCEDURE IN DETAIL:  After induction of anesthesia with preoperative femoral nerve block, standard prepping and draping was performed with proximal thigh tourniquet, heel bump lateral post proximal thigh tourniquet, Foley catheter had been placed after induction of anesthesia and intubation.  DuraPrep was used.  Usual total knee drapes, impervious stockinette, Coban, sterile skin marker, and Betadine, Steri-Drape was applied to seal the skin.  Time-out procedure was completed.  Leg was elevated, wrapped in Esmarch, tourniquet inflated.  Midline incision was made.  Superficial retinaculum was developed.  Quad tendon split between the medial one-third and lateral two-thirds, and medial parapatellar incision.  Partially bursa in the patella removing 10 mm of bone from the facet to facet, sizing for 38 mm 3 pegged.  Computer pins were inserted inside the incision on the femur, stab incision in the mid tibia.  Computer models were generated.  A 9 mm were taken off the  high, 7 off the level in the femur.  The patient had 9 degree flexion contracture less than 1 degree varus deformity.  There were large 1 cm osteophytes, grade 4 chondromalacia, polished subchondral bone with erosive wear.  A 7 mm were taken off the tibia and after keel preparation box cuts and trials, removal of posterior spurs, some additional PCL to be resected and the patient still was slightly tight in both flexion, extension.  An additional 2 mm removed off the tibia, verified, heel re-preparation and trials which gave good flexion, extension, balance, and restitution alignment with full extension. Pulsatile lavage, vacuum mixing.  Lug nut holes were drilled.  The patella was prepared.  Once drying, protection of neurovascular bundle with McCabe retractor, tibia cemented first, followed by femur, placement of poly in appropriate position.  Flexion, extension, balance was poor, 23.5 mm within, 0.5 mm flexion and extension.  Extension, flexion to 140, good patellar tracking.  All excessive cement had been removed.  Once cement was hardened, 15 minutes tourniquet was deflated. Hemostasis was obtained with standard layered closure, absorbable deep layer, 2-0 on the subcutaneous tissue, superficial retinaculum and subcuticular skin closure.  Tincture of benzoin, Steri-Strips, postop dressing, and standard knee immobilizer was applied.  The patient was transferred to the recovery room in stable condition.  Kao Conry C. Ophelia Charter, M.D.     MCY/MEDQ  D:  04/30/2013  T:  05/01/2013  Job:  [id number]

## 2013-05-02 NOTE — Progress Notes (Signed)
Physical Therapy Treatment Patient Details Name: Russell [name] MRN: [id number] DOB: [birth year] Today's Date: 05/02/2013 Time: 6295-2841 PT Time Calculation (min): 36 min  PT Assessment / Plan / Recommendation  History of Present Illness Pt is 67yo M s/p LTKA   PT Comments   Pt's wife present and pt. Involved her in exercise completion so she would help him remember techniques when they got home.  Pt. Is completing exercises carefully and correctly and is motivated to do his best with each exercise. He is ambulating at mod I level at least 200 feet with RW.  Acute goals met and pt. Has DC orders this am.  Wife asked about tub bench for pt.  She wants to get RW and tub bench at the same time.  Wife was brought to PT gym and educated on correct use of tub bence for pt.  She does not want case mgr to obtain equipment and she plans to go this am to pick up his 2 pieces of equipment.  Pt. DC'd from acute PT services.  Follow Up Recommendations  Home health PT;Supervision/Assistance - 24 hour     Does the patient have the potential to tolerate intense rehabilitation     Barriers to Discharge        Equipment Recommendations  Rolling walker with 5" wheels;Other (comment) (wife requesting tub bench)    Recommendations for Other Services    Frequency 7X/week   Progress towards PT Goals Progress towards PT goals: Goals met/education completed, patient discharged from PT  Plan Current plan remains appropriate    Precautions / Restrictions Precautions Precautions: Knee Required Braces or Orthoses:  (not used as pt. can SLR without lag this am) Restrictions Weight Bearing Restrictions: Yes LLE Weight Bearing: Weight bearing as tolerated   Pertinent Vitals/Pain See vitals tab     Mobility  Bed Mobility Bed Mobility: Supine to Sit;Sitting - Scoot to Edge of Bed Supine to Sit: 6: Modified independent (Device/Increase time);HOB elevated Sitting - Scoot to Edge of Bed: 6: Modified independent  (Device/Increase time) Details for Bed Mobility Assistance: managing without assist Transfers Transfers: Sit to Stand;Stand to Sit Sit to Stand: 6: Modified independent (Device/Increase time);From bed;With upper extremity assist Stand to Sit: 6: Modified independent (Device/Increase time);To chair/3-in-1;With upper extremity assist;With armrests Details for Transfer Assistance: one initial reminder for hand placement then he was able to demo correct technique.  At mod I level and not needing physical assist this am Ambulation/Gait Ambulation/Gait Assistance: 6: Modified independent (Device/Increase time) Ambulation Distance (Feet): 200 Feet Assistive device: Rolling walker Ambulation/Gait Assistance Details: Good technique, no physical assist needed and at mod I level with RW Gait Pattern: Step-to pattern Stairs: No    Exercises Total Joint Exercises Ankle Circles/Pumps: AROM;Both;10 reps;Supine Quad Sets: AROM;Both;10 reps;Supine Short Arc Quad: AROM;Left;Supine Straight Leg Raises: AROM;Left;10 reps;Supine Long Arc Quad: AROM;Left;10 reps;Seated Knee Flexion: AROM;Left;10 reps;Seated Goniometric ROM: 0 to 90   PT Diagnosis:    PT Problem List:   PT Treatment Interventions:     PT Goals (current goals can now be found in the care plan section)    Visit Information  Last PT Received On: 05/02/13 Assistance Needed: +1 History of Present Illness: Pt is 67yo M s/p LTKA    Subjective Data  Subjective: Pt. states he is anxious and ready to get home.  Wife states she is going to pick up a RW and a tub bench this am.  she prefers to pick them up instead of having them  delivered to the hospital.   Cognition  Cognition Arousal/Alertness: Awake/alert Behavior During Therapy: [facility] for tasks assessed/performed Overall Cognitive Status: Within Functional Limits for tasks assessed    Balance     End of Session PT - End of Session Equipment Utilized During Treatment: Gait  belt Activity Tolerance: Patient tolerated treatment well Patient left: in chair;with call bell/phone within reach;with family/visitor present Nurse Communication: Mobility status   GP     Russell [name] 05/02/2013, 10:09 AM [name] PT Acute Rehab Services [phone] [place] [phone]

## 2013-05-02 NOTE — Progress Notes (Signed)
Subjective: 2 Days Post-Op Procedure(s) (LRB): COMPUTER ASSISTED TOTAL KNEE ARTHROPLASTY (Left) Patient reports pain as 3 on 0-10 scale.    Objective: Vital signs in last 24 hours: Temp:  [97.8 F (36.6 C)-98.8 F (37.1 C)] 97.8 F (36.6 C) (11/14 0614) Pulse Rate:  [70-76] 75 (11/14 0614) Resp:  [16-20] 18 (11/14 0757) BP: (122-126)/(74-80) 125/80 mmHg (11/14 0614) SpO2:  [95 %-99 %] 99 % (11/14 0614)  Intake/Output from previous day: 11/13 0701 - 11/14 0700 In: 120 [P.O.:120] Out: 1150 [Urine:1150] Intake/Output this shift:     Recent Labs  05/01/13 0540 05/02/13 0633  HGB 12.0* 11.3*    Recent Labs  05/01/13 0540 05/02/13 0633  WBC 10.1 10.7*  RBC 3.67* 3.45*  HCT 35.4* 33.2*  PLT 198 187    Recent Labs  05/01/13 0540  NA 135  K 4.1  CL 99  CO2 24  BUN 19  CREATININE 0.82  GLUCOSE 123*  CALCIUM 8.2*   No results found for this basename: LABPT, INR,  in the last 72 hours  Neurologically intact  Assessment/Plan: 2 Days Post-Op Procedure(s) (LRB): COMPUTER ASSISTED TOTAL KNEE ARTHROPLASTY (Left) Discharge home with home health  , can SLR , quad doing well , full extension. Office one week.   Russell [name] C 05/02/2013, 8:03 AM

## 2013-05-03 DIAGNOSIS — Z471 Aftercare following joint replacement surgery: Secondary | ICD-10-CM | POA: Diagnosis not present

## 2013-05-03 DIAGNOSIS — M1710 Unilateral primary osteoarthritis, unspecified knee: Secondary | ICD-10-CM | POA: Diagnosis not present

## 2013-05-03 DIAGNOSIS — Z96659 Presence of unspecified artificial knee joint: Secondary | ICD-10-CM | POA: Diagnosis not present

## 2013-05-03 DIAGNOSIS — IMO0001 Reserved for inherently not codable concepts without codable children: Secondary | ICD-10-CM | POA: Diagnosis not present

## 2013-05-05 ENCOUNTER — Encounter (HOSPITAL_COMMUNITY): Payer: Self-pay | Admitting: Orthopaedic Surgery

## 2013-05-06 DIAGNOSIS — M1710 Unilateral primary osteoarthritis, unspecified knee: Secondary | ICD-10-CM | POA: Diagnosis not present

## 2013-05-06 DIAGNOSIS — M25569 Pain in unspecified knee: Secondary | ICD-10-CM | POA: Diagnosis not present

## 2013-05-06 DIAGNOSIS — M23329 Other meniscus derangements, posterior horn of medial meniscus, unspecified knee: Secondary | ICD-10-CM | POA: Diagnosis not present

## 2013-05-06 DIAGNOSIS — Z96659 Presence of unspecified artificial knee joint: Secondary | ICD-10-CM | POA: Diagnosis not present

## 2013-05-06 DIAGNOSIS — Z471 Aftercare following joint replacement surgery: Secondary | ICD-10-CM | POA: Diagnosis not present

## 2013-05-06 DIAGNOSIS — IMO0001 Reserved for inherently not codable concepts without codable children: Secondary | ICD-10-CM | POA: Diagnosis not present

## 2013-05-07 DIAGNOSIS — Z96659 Presence of unspecified artificial knee joint: Secondary | ICD-10-CM | POA: Diagnosis not present

## 2013-05-07 DIAGNOSIS — Z471 Aftercare following joint replacement surgery: Secondary | ICD-10-CM | POA: Diagnosis not present

## 2013-05-07 DIAGNOSIS — IMO0001 Reserved for inherently not codable concepts without codable children: Secondary | ICD-10-CM | POA: Diagnosis not present

## 2013-05-09 DIAGNOSIS — IMO0001 Reserved for inherently not codable concepts without codable children: Secondary | ICD-10-CM | POA: Diagnosis not present

## 2013-05-09 DIAGNOSIS — Z471 Aftercare following joint replacement surgery: Secondary | ICD-10-CM | POA: Diagnosis not present

## 2013-05-09 DIAGNOSIS — Z96659 Presence of unspecified artificial knee joint: Secondary | ICD-10-CM | POA: Diagnosis not present

## 2013-05-12 DIAGNOSIS — Z96659 Presence of unspecified artificial knee joint: Secondary | ICD-10-CM | POA: Diagnosis not present

## 2013-05-12 DIAGNOSIS — Z471 Aftercare following joint replacement surgery: Secondary | ICD-10-CM | POA: Diagnosis not present

## 2013-05-12 DIAGNOSIS — IMO0001 Reserved for inherently not codable concepts without codable children: Secondary | ICD-10-CM | POA: Diagnosis not present

## 2013-05-12 NOTE — Discharge Summary (Signed)
Physician Discharge Summary  Patient ID: Russell [name] MRN: [id number] DOB/AGE: [birth year] 67 y.o.  Admit date: 04/30/2013 Discharge date: 05/02/2013  Admission Diagnoses:  Osteoarthritis of left knee  Discharge Diagnoses:  Principal Problem:   Osteoarthritis of left knee   Past Medical History  Diagnosis Date  . Smoker   . Other and unspecified hyperlipidemia   . Hx of colonic polyps   . Hemorrhoids   . Acute prostatitis   . DJD (degenerative joint disease)   . History of skin cancer   . Whole blood donor   . Cancer     basal cell removed    Surgeries: Procedure(s): COMPUTER ASSISTED LEFT TOTAL KNEE ARTHROPLASTY on 04/30/2013   Consultants (if any):  none  Discharged Condition: Improved  Hospital Course: Russell [name] is an 67 y.o. male who was admitted 04/30/2013 with a diagnosis of Osteoarthritis of left knee and went to the operating room on 04/30/2013 and underwent the above named procedures.    He was given perioperative antibiotics:  Anti-infectives   Start     Dose/Rate Route Frequency Ordered Stop   04/30/13 0600  ceFAZolin (ANCEF) IVPB 2 g/50 mL premix     2 g 100 mL/hr over 30 Minutes Intravenous On call to O.R. 04/29/13 1438 04/30/13 1247    .  He was given sequential compression devices, early ambulation, and aspirin for DVT prophylaxis.  He benefited maximally from the hospital stay and there were no complications.    Recent vital signs:  Filed Vitals:   05/02/13 1200  BP:   Pulse:   Temp:   Resp: 18    Recent laboratory studies:  Lab Results  Component Value Date   HGB 11.3* 05/02/2013   HGB 12.0* 05/01/2013   HGB 14.9 04/18/2013   Lab Results  Component Value Date   WBC 10.7* 05/02/2013   PLT 187 05/02/2013   Lab Results  Component Value Date   INR 0.96 04/18/2013   Lab Results  Component Value Date   NA 135 05/01/2013   K 4.1 05/01/2013   CL 99 05/01/2013   CO2 24 05/01/2013   BUN 19 05/01/2013   CREATININE 0.82  05/01/2013   GLUCOSE 123* 05/01/2013    Discharge Medications:     Medication List    STOP taking these medications       aspirin 81 MG tablet  Replaced by:  aspirin EC 325 MG tablet      TAKE these medications       aspirin EC 325 MG tablet  Take 1 tablet (325 mg total) by mouth daily.     hydrocortisone 2.5 % rectal cream  Commonly known as:  ANUSOL-HC  Apply rectally 2 times daily as needed     methocarbamol 500 MG tablet  Commonly known as:  ROBAXIN  Take 1 tablet (500 mg total) by mouth every 6 (six) hours as needed for muscle spasms.     MULTIVITAMIN & MINERAL PO  Take 1 tablet by mouth daily.     oxyCODONE-acetaminophen 5-325 MG per tablet  Commonly known as:  PERCOCET/ROXICET  Take 1-2 tablets by mouth every 4 (four) hours as needed for severe pain.     simvastatin 40 MG tablet  Commonly known as:  ZOCOR  Take 40 mg by mouth every evening.        Diagnostic Studies: No results found.  Disposition: 06-Home-Health Care Svc      Discharge Orders   Future Appointments Provider Department Dept  Phone   03/19/2014 9:00 AM Michele Mcalpine, MD [facility] [phone]   Future Orders Complete By Expires   Nursing communication  As directed    Scheduling Instructions:     [name] wife stated that he had an EKG and chest x-ray at Dr. Jodelle Green office earlier this month.    DISCHARGE INSTRUCTIONS: Keep knee incision dry for 5 days post op then may wet while bathing. Change dressing daily or as needed.  Keep wound covered.  Ice packs as needed for pain and swelling. Therapy daily and  goal full extension and greater than 90 degrees flexion. Call if fever or chills or increased drainage. Go to ER if acutely short of breath or call for ambulance. Return for follow up in 2 weeks. May full weight bear on the surgical leg unless told otherwise. Use knee immobilizer until able to straight leg raise off bed with knee stable. In house walking for first 2  weeks.  Follow-up Information   Follow up with Eldred Manges, MD. Schedule an appointment as soon as possible for a visit in 2 weeks.   Specialty:  Orthopedic Surgery   Contact information:   [address] [place] [address] [phone]       Follow up with [facility])    Contact information:   [address] [place] [address] [phone]        Signed: Wende Neighbors 05/12/2013, 3:36 PM

## 2013-05-13 ENCOUNTER — Encounter (HOSPITAL_COMMUNITY): Payer: Self-pay | Admitting: *Deleted

## 2013-05-13 ENCOUNTER — Encounter (HOSPITAL_COMMUNITY): Payer: Self-pay | Admitting: Pharmacy Technician

## 2013-05-13 ENCOUNTER — Other Ambulatory Visit (HOSPITAL_COMMUNITY): Payer: Self-pay | Admitting: *Deleted

## 2013-05-13 ENCOUNTER — Other Ambulatory Visit (HOSPITAL_COMMUNITY): Payer: Self-pay | Admitting: Orthopaedic Surgery

## 2013-05-13 DIAGNOSIS — M23329 Other meniscus derangements, posterior horn of medial meniscus, unspecified knee: Secondary | ICD-10-CM | POA: Diagnosis not present

## 2013-05-13 DIAGNOSIS — M9420 Chondromalacia, unspecified site: Secondary | ICD-10-CM | POA: Diagnosis not present

## 2013-05-13 DIAGNOSIS — M1710 Unilateral primary osteoarthritis, unspecified knee: Secondary | ICD-10-CM | POA: Diagnosis not present

## 2013-05-13 DIAGNOSIS — M25569 Pain in unspecified knee: Secondary | ICD-10-CM | POA: Diagnosis not present

## 2013-05-14 ENCOUNTER — Observation Stay (HOSPITAL_COMMUNITY)
Admission: AD | Admit: 2013-05-14 | Discharge: 2013-05-15 | Disposition: A | Discharge status: Home / Self Care | Payer: Medicare Other | Source: Ambulatory Visit | Attending: Orthopaedic Surgery | Admitting: Orthopaedic Surgery

## 2013-05-14 ENCOUNTER — Encounter (HOSPITAL_COMMUNITY): Payer: Self-pay | Admitting: Certified Registered Nurse Anesthetist

## 2013-05-14 ENCOUNTER — Encounter (HOSPITAL_COMMUNITY): Payer: Medicare Other | Admitting: Certified Registered Nurse Anesthetist

## 2013-05-14 ENCOUNTER — Ambulatory Visit (HOSPITAL_COMMUNITY): Payer: Medicare Other | Admitting: Certified Registered Nurse Anesthetist

## 2013-05-14 ENCOUNTER — Encounter (HOSPITAL_COMMUNITY)
Admission: AD | Disposition: A | Discharge status: Home / Self Care | Payer: Self-pay | Source: Ambulatory Visit | Attending: Orthopaedic Surgery

## 2013-05-14 DIAGNOSIS — M1710 Unilateral primary osteoarthritis, unspecified knee: Secondary | ICD-10-CM | POA: Diagnosis not present

## 2013-05-14 DIAGNOSIS — M24469 Recurrent dislocation, unspecified knee: Principal | ICD-10-CM | POA: Insufficient documentation

## 2013-05-14 DIAGNOSIS — Z96659 Presence of unspecified artificial knee joint: Secondary | ICD-10-CM | POA: Insufficient documentation

## 2013-05-14 DIAGNOSIS — S83005A Unspecified dislocation of left patella, initial encounter: Secondary | ICD-10-CM

## 2013-05-14 DIAGNOSIS — Z87442 Personal history of urinary calculi: Secondary | ICD-10-CM | POA: Insufficient documentation

## 2013-05-14 DIAGNOSIS — F17200 Nicotine dependence, unspecified, uncomplicated: Secondary | ICD-10-CM | POA: Diagnosis not present

## 2013-05-14 DIAGNOSIS — S83006A Unspecified dislocation of unspecified patella, initial encounter: Secondary | ICD-10-CM | POA: Diagnosis not present

## 2013-05-14 DIAGNOSIS — IMO0002 Reserved for concepts with insufficient information to code with codable children: Secondary | ICD-10-CM | POA: Diagnosis not present

## 2013-05-14 HISTORY — DX: Calculus of kidney: N20.0

## 2013-05-14 HISTORY — DX: Unspecified dislocation of unspecified patella, initial encounter: S83.006A

## 2013-05-14 HISTORY — PX: PATELLAR TENDON REPAIR: SHX737

## 2013-05-14 LAB — BASIC METABOLIC PANEL
CO2: 25 mEq/L (ref 19–32)
Calcium: 9.1 mg/dL (ref 8.4–10.5)
GFR calc Af Amer: >90 mL/min (ref >90)
Glucose, Bld: 111 mg/dL — ABNORMAL HIGH (ref 70–99)
Potassium: 4.3 mEq/L (ref 3.5–5.1)
Sodium: 142 mEq/L (ref 135–145)

## 2013-05-14 LAB — CBC
MCH: 33.5 pg (ref 26.0–34.0)
MCV: 97.5 fL (ref 78.0–100.0)
Platelets: 392 10*3/uL (ref 150–400)
RDW: 13.8 % (ref 11.5–15.5)

## 2013-05-14 LAB — TYPE AND SCREEN

## 2013-05-14 SURGERY — REPAIR, TENDON, PATELLAR
Anesthesia: General | Site: Knee | Laterality: Left | Wound class: Clean

## 2013-05-14 MED ORDER — METOCLOPRAMIDE HCL 5 MG/ML IJ SOLN: 5.0000 mg | Freq: Three times a day (TID) | INTRAMUSCULAR | Status: DC | PRN

## 2013-05-14 MED ORDER — PHENOL 1.4 % MT LIQD: 1.0000 | OROMUCOSAL | Status: DC | PRN

## 2013-05-14 MED ORDER — BISACODYL 10 MG RE SUPP: 10.0000 mg | Freq: Every day | RECTAL | Status: DC | PRN

## 2013-05-14 MED ORDER — FENTANYL CITRATE 0.05 MG/ML IJ SOLN
INTRAMUSCULAR | Status: DC | PRN
  Administered 2013-05-14: 25 ug via INTRAVENOUS
  Administered 2013-05-14 (×2): 50 ug via INTRAVENOUS
  Administered 2013-05-14 (×2): 25 ug via INTRAVENOUS
  Administered 2013-05-14: 50 ug via INTRAVENOUS
  Administered 2013-05-14: 25 ug via INTRAVENOUS

## 2013-05-14 MED ORDER — ACETAMINOPHEN 325 MG PO TABS: 650.0000 mg | ORAL_TABLET | Freq: Four times a day (QID) | ORAL | Status: DC | PRN

## 2013-05-14 MED ORDER — ZOLPIDEM TARTRATE 5 MG PO TABS: 5.0000 mg | ORAL_TABLET | Freq: Every evening | ORAL | Status: DC | PRN

## 2013-05-14 MED ORDER — LIDOCAINE HCL 1 % IJ SOLN
INTRAMUSCULAR | Status: DC | PRN
  Administered 2013-05-14: 40 mg via INTRADERMAL

## 2013-05-14 MED ORDER — CEFAZOLIN SODIUM-DEXTROSE 2-3 GM-% IV SOLR: 2.0000 g | INTRAVENOUS | Status: DC

## 2013-05-14 MED ORDER — METOCLOPRAMIDE HCL 10 MG PO TABS: 5.0000 mg | ORAL_TABLET | Freq: Three times a day (TID) | ORAL | Status: DC | PRN

## 2013-05-14 MED ORDER — ACETAMINOPHEN 650 MG RE SUPP: 650.0000 mg | Freq: Four times a day (QID) | RECTAL | Status: DC | PRN

## 2013-05-14 MED ORDER — HYDROMORPHONE HCL PF 1 MG/ML IJ SOLN
INTRAMUSCULAR | Status: DC | PRN
Start: 2013-05-14 — End: 2013-05-14
  Administered 2013-05-14 (×3): .2 mg via INTRAVENOUS

## 2013-05-14 MED ORDER — HYDROMORPHONE HCL PF 1 MG/ML IJ SOLN
0.2500 mg | INTRAMUSCULAR | Status: DC | PRN
  Administered 2013-05-14 (×3): 0.5 mg via INTRAVENOUS

## 2013-05-14 MED ORDER — MORPHINE SULFATE 2 MG/ML IJ SOLN: 2.0000 mg | INTRAMUSCULAR | Status: DC | PRN

## 2013-05-14 MED ORDER — LACTATED RINGERS IV SOLN
INTRAVENOUS | Status: DC | PRN
  Administered 2013-05-14 (×2): via INTRAVENOUS

## 2013-05-14 MED ORDER — MIDAZOLAM HCL 5 MG/5ML IJ SOLN
INTRAMUSCULAR | Status: DC | PRN
  Administered 2013-05-14: 2 mg via INTRAVENOUS

## 2013-05-14 MED ORDER — SIMVASTATIN 40 MG PO TABS
40.0000 mg | ORAL_TABLET | Freq: Every evening | ORAL | Status: DC
  Administered 2013-05-14: 40 mg via ORAL
  Filled 2013-05-14 (×2): qty 1

## 2013-05-14 MED ORDER — ONDANSETRON HCL 4 MG/2ML IJ SOLN: 4.0000 mg | Freq: Once | INTRAMUSCULAR | Status: DC | PRN

## 2013-05-14 MED ORDER — SODIUM CHLORIDE 0.9 % IJ SOLN
INTRAMUSCULAR | Status: DC | PRN
  Administered 2013-05-14: 09:00:00

## 2013-05-14 MED ORDER — HYDROMORPHONE HCL PF 1 MG/ML IJ SOLN
INTRAMUSCULAR | Status: AC
  Filled 2013-05-14: qty 1

## 2013-05-14 MED ORDER — HYDROCORTISONE 2.5 % RE CREA
1.0000 "application " | TOPICAL_CREAM | Freq: Two times a day (BID) | RECTAL | Status: DC
  Filled 2013-05-14: qty 28.35

## 2013-05-14 MED ORDER — PROPOFOL 10 MG/ML IV BOLUS
INTRAVENOUS | Status: DC | PRN
  Administered 2013-05-14: 180 mg via INTRAVENOUS

## 2013-05-14 MED ORDER — CEFAZOLIN SODIUM-DEXTROSE 2-3 GM-% IV SOLR
INTRAVENOUS | Status: DC | PRN
  Administered 2013-05-14: 2 g via INTRAVENOUS

## 2013-05-14 MED ORDER — 0.9 % SODIUM CHLORIDE (POUR BTL) OPTIME
TOPICAL | Status: DC | PRN
  Administered 2013-05-14: 1000 mL

## 2013-05-14 MED ORDER — METHOCARBAMOL 500 MG PO TABS: 500.0000 mg | ORAL_TABLET | Freq: Four times a day (QID) | ORAL | Status: DC | PRN

## 2013-05-14 MED ORDER — OXYCODONE-ACETAMINOPHEN 5-325 MG PO TABS
1.0000 | ORAL_TABLET | ORAL | Status: DC | PRN
  Administered 2013-05-14 – 2013-05-15 (×2): 2 via ORAL
  Filled 2013-05-14 (×2): qty 2

## 2013-05-14 MED ORDER — ASPIRIN EC 325 MG PO TBEC
325.0000 mg | DELAYED_RELEASE_TABLET | Freq: Every day | ORAL | Status: DC
  Administered 2013-05-15: 325 mg via ORAL
  Filled 2013-05-14 (×2): qty 1

## 2013-05-14 MED ORDER — KETOROLAC TROMETHAMINE 30 MG/ML IJ SOLN
30.0000 mg | Freq: Once | INTRAMUSCULAR | Status: AC
  Administered 2013-05-14: 30 mg via INTRAVENOUS

## 2013-05-14 MED ORDER — KETOROLAC TROMETHAMINE 15 MG/ML IJ SOLN
15.0000 mg | Freq: Four times a day (QID) | INTRAMUSCULAR | Status: AC
  Administered 2013-05-14 – 2013-05-15 (×4): 15 mg via INTRAVENOUS
  Filled 2013-05-14 (×4): qty 1

## 2013-05-14 MED ORDER — ONDANSETRON HCL 4 MG PO TABS: 4.0000 mg | ORAL_TABLET | Freq: Four times a day (QID) | ORAL | Status: DC | PRN

## 2013-05-14 MED ORDER — KETOROLAC TROMETHAMINE 30 MG/ML IJ SOLN
INTRAMUSCULAR | Status: AC
  Filled 2013-05-14: qty 1

## 2013-05-14 MED ORDER — OXYCODONE-ACETAMINOPHEN 5-325 MG PO TABS: 1.0000 | ORAL_TABLET | ORAL | Status: DC | PRN

## 2013-05-14 MED ORDER — CEFAZOLIN SODIUM-DEXTROSE 2-3 GM-% IV SOLR
INTRAVENOUS | Status: AC
  Filled 2013-05-14: qty 50

## 2013-05-14 MED ORDER — ASPIRIN EC 325 MG PO TBEC
325.0000 mg | DELAYED_RELEASE_TABLET | Freq: Every day | ORAL | Status: DC
  Administered 2013-05-14: 325 mg via ORAL
  Filled 2013-05-14 (×2): qty 1

## 2013-05-14 MED ORDER — METHOCARBAMOL 100 MG/ML IJ SOLN
500.0000 mg | Freq: Four times a day (QID) | INTRAVENOUS | Status: DC | PRN
  Filled 2013-05-14: qty 5

## 2013-05-14 MED ORDER — MENTHOL 3 MG MT LOZG: 1.0000 | LOZENGE | OROMUCOSAL | Status: DC | PRN

## 2013-05-14 MED ORDER — ONDANSETRON HCL 4 MG/2ML IJ SOLN
INTRAMUSCULAR | Status: DC | PRN
  Administered 2013-05-14: 4 mg via INTRAVENOUS

## 2013-05-14 MED ORDER — KCL IN DEXTROSE-NACL 20-5-0.45 MEQ/L-%-% IV SOLN
INTRAVENOUS | Status: DC
  Administered 2013-05-14: 19:00:00 via INTRAVENOUS
  Filled 2013-05-14 (×3): qty 1000

## 2013-05-14 MED ORDER — ONDANSETRON HCL 4 MG/2ML IJ SOLN: 4.0000 mg | Freq: Four times a day (QID) | INTRAMUSCULAR | Status: DC | PRN

## 2013-05-14 MED ORDER — BISACODYL 5 MG PO TBEC: 10.0000 mg | DELAYED_RELEASE_TABLET | Freq: Every day | ORAL | Status: DC | PRN

## 2013-05-14 MED ORDER — ARTIFICIAL TEARS OP OINT
TOPICAL_OINTMENT | OPHTHALMIC | Status: DC | PRN
  Administered 2013-05-14: 1 via OPHTHALMIC

## 2013-05-14 MED ORDER — SENNOSIDES-DOCUSATE SODIUM 8.6-50 MG PO TABS
1.0000 | ORAL_TABLET | Freq: Every evening | ORAL | Status: DC | PRN
  Administered 2013-05-14: 1 via ORAL
  Filled 2013-05-14: qty 1

## 2013-05-14 SURGICAL SUPPLY — 53 items
BANDAGE ELASTIC 4 VELCRO ST LF (GAUZE/BANDAGES/DRESSINGS) ×2 IMPLANT
BANDAGE ELASTIC 6 VELCRO ST LF (GAUZE/BANDAGES/DRESSINGS) ×2 IMPLANT
BLADE SURG ROTATE 9660 (MISCELLANEOUS) ×1 IMPLANT
BNDG COHESIVE 4X5 TAN STRL (GAUZE/BANDAGES/DRESSINGS) ×2 IMPLANT
CLOTH BEACON ORANGE TIMEOUT ST (SAFETY) ×1 IMPLANT
COVER SURGICAL LIGHT HANDLE (MISCELLANEOUS) ×2 IMPLANT
CUFF TOURNIQUET SINGLE 34IN LL (TOURNIQUET CUFF) ×1 IMPLANT
CUFF TOURNIQUET SINGLE 44IN (TOURNIQUET CUFF) IMPLANT
DRAPE U-SHAPE 47X51 STRL (DRAPES) ×2 IMPLANT
DRSG ADAPTIC 3X8 NADH LF (GAUZE/BANDAGES/DRESSINGS) ×1 IMPLANT
DRSG EMULSION OIL 3X3 NADH (GAUZE/BANDAGES/DRESSINGS) ×2 IMPLANT
DRSG PAD ABDOMINAL 8X10 ST (GAUZE/BANDAGES/DRESSINGS) ×2 IMPLANT
DURAPREP 26ML APPLICATOR (WOUND CARE) ×1 IMPLANT
ELECT REM PT RETURN 9FT ADLT (ELECTROSURGICAL) ×2
ELECTRODE REM PT RTRN 9FT ADLT (ELECTROSURGICAL) ×1 IMPLANT
GLOVE BIOGEL PI IND STRL 7.5 (GLOVE) IMPLANT
GLOVE BIOGEL PI IND STRL 8 (GLOVE) ×1 IMPLANT
GLOVE BIOGEL PI INDICATOR 7.5 (GLOVE)
GLOVE BIOGEL PI INDICATOR 8 (GLOVE) ×1
GLOVE ECLIPSE 7.0 STRL STRAW (GLOVE) ×1 IMPLANT
GLOVE ORTHO TXT STRL SZ7.5 (GLOVE) ×2 IMPLANT
GOWN PREVENTION PLUS LG XLONG (DISPOSABLE) IMPLANT
GOWN PREVENTION PLUS XLARGE (GOWN DISPOSABLE) ×4 IMPLANT
IMMOBILIZER KNEE 22 UNIV (SOFTGOODS) ×2 IMPLANT
KIT BASIN OR (CUSTOM PROCEDURE TRAY) ×2 IMPLANT
KIT ROOM TURNOVER OR (KITS) ×2 IMPLANT
MANIFOLD NEPTUNE II (INSTRUMENTS) ×2 IMPLANT
NEEDLE 22X1 1/2 (OR ONLY) (NEEDLE) IMPLANT
NS IRRIG 1000ML POUR BTL (IV SOLUTION) ×2 IMPLANT
PACK ORTHO EXTREMITY (CUSTOM PROCEDURE TRAY) ×2 IMPLANT
PACK UNIVERSAL I (CUSTOM PROCEDURE TRAY) ×1 IMPLANT
PAD ARMBOARD 7.5X6 YLW CONV (MISCELLANEOUS) ×4 IMPLANT
PAD CAST 4YDX4 CTTN HI CHSV (CAST SUPPLIES) ×2 IMPLANT
PADDING CAST COTTON 4X4 STRL (CAST SUPPLIES) ×4
PASSER SUT SWANSON 36MM LOOP (INSTRUMENTS) IMPLANT
SPONGE LAP 4X18 X RAY DECT (DISPOSABLE) IMPLANT
STAPLER VISISTAT 35W (STAPLE) IMPLANT
STOCKINETTE IMPERVIOUS 9X36 MD (GAUZE/BANDAGES/DRESSINGS) ×2 IMPLANT
SUCTION FRAZIER TIP 10 FR DISP (SUCTIONS) ×2 IMPLANT
SUT ETHIBOND NAB CT1 #1 30IN (SUTURE) ×3 IMPLANT
SUT FIBERWIRE #2 38 REV NDL BL (SUTURE)
SUT VIC AB 0 CT1 27 (SUTURE) ×2
SUT VIC AB 0 CT1 27XBRD ANBCTR (SUTURE) ×1 IMPLANT
SUT VIC AB 2-0 CT1 27 (SUTURE) ×2
SUT VIC AB 2-0 CT1 TAPERPNT 27 (SUTURE) ×2 IMPLANT
SUTURE FIBERWR#2 38 REV NDL BL (SUTURE) IMPLANT
SYR CONTROL 10ML LL (SYRINGE) IMPLANT
TOWEL OR 17X24 6PK STRL BLUE (TOWEL DISPOSABLE) ×2 IMPLANT
TOWEL OR 17X26 10 PK STRL BLUE (TOWEL DISPOSABLE) ×2 IMPLANT
TUBE CONNECTING 12X1/4 (SUCTIONS) ×2 IMPLANT
UNDERPAD 30X30 INCONTINENT (UNDERPADS AND DIAPERS) ×1 IMPLANT
WATER STERILE IRR 1000ML POUR (IV SOLUTION) ×2 IMPLANT
YANKAUER SUCT BULB TIP NO VENT (SUCTIONS) ×1 IMPLANT

## 2013-05-14 NOTE — Transfer of Care (Signed)
Immediate Anesthesia Transfer of Care Note  Patient: Russell [name]  Procedure(s) Performed: Procedure(s) with comments: Left Total Knee Arthroplasty Medial Retinaculum Repair (Left) - Left Total Knee Arthroplasty Medial Retinaculum Repair  Patient Location: PACU  Anesthesia Type:General  Level of Consciousness: awake, alert , oriented and patient cooperative  Airway & Oxygen Therapy: Patient Spontanous Breathing and Patient connected to nasal cannula oxygen  Post-op Assessment: Report given to PACU RN and Post -op Vital signs reviewed and stable  Post vital signs: Reviewed and stable  Complications: No apparent anesthesia complications

## 2013-05-14 NOTE — Op Note (Signed)
Test test test  Preop diagnosis: Left patellar dislocation, 2 weeks postop from cemented total knee arthroplasty  Postop diagnosis: Same  Procedure: Repair left knee medial capsule post lateral patellar dislocation   surgeon: Annell Greening M.D.  Assistant:  Maud Deed PA-C medically necessary present for the entire procedure  Anesthesia: Gen. plus and Esporil long acting Marcaine  Tourniquet: Less than 45 minutes x53  This 67 year old male is 2 weeks post total knee arthroplasty  and was seen 1 week ago with range of motion of 3-87 and he was able to straight leg raise. He was ambulating doing well. He presented to the office yesterday and  had a 30 extension lag with obvious lateral dislocation of patella confirmed by x-ray and had torn the medial retinacular repair which had been repaired with multiple Ethibond #1 sutures interrupted. Sunrise view showed the patella dislocated all components were well cemented he had no fever or chills and incision looked good.  Procedure: After induction general anesthesia 2 g Ancef prophylaxis prepping with DuraPrep a proximal thigh tourniquet usual impervious stockinette Caban split sheets drapes and extremity drape was applied. Timeout procedure was completed old the subcuticular skin incision which healed nicely was marked with a sterile skin marker and then a Betadine Steri-Drape was applied sealing the skin. Leg was elevated wrapped an Esmarch tourniquet inflated. Timeout procedure had been completed and incision was opened using the old incision. Subcutaneous tissue Vicryl sutures were removed and immediately serosanguineous fluid was present and finger dissection of the rest incision. Patient had torn the medial retinacular tear which extended 3 inches in the quad tendon repair which and sutured the lateral two thirds of the tendon to the medial one third. Distal aspect of the patellar tendon repair medially was still intact. Prosthesis was visualized  collateral ligaments are stable medial collateral is normal there was clot in the joint which was irrigated out suctioned removed edges were curetted. Patient had multiple #1 Ethibond sutures some had torn and a few had pulled loose from the medial capsule and were still attached to the capsule adjacent to the patella. There was a 1 cm cuff of tissue still present attached to the patella and in no areas had a full directly off of the bone. Continue irrigation was performed comment edges of the medial retinaculum quad tendon were scrapes some with a curet so that the anatomy was clear and interrupted sutures were placed a total of 40 to 50 sutures of #1 Ethibond interrupted some figure-of-eight some simple sutures were placed for closure. A larger bites of tissue were taken with a suture and some areas to give an additional securement for the repair. Quad tendon and the medial capsule came together nicely. The long-acting Marcaine lyses nonenhanced had been injected at the very beginning of the procedure and there was 10 cc remaining of the total of 30 units was injected into the knee capsule after the capsule is completely closed. Subcutaneous tissue was reapproximated with 2-0 Vicryl and subcuticular closure was done again on the scan will tincture benzoin Steri-Strips exactly as before. Patient denied any specific injury or falling after his surgery. He had had several times in and foot slipped as he was turning or twisting and had noticed some sharp pain or instability and likely male popped a few stitches in the capsule repair and then continue to tear more with time until the patella dislocated laterally. Is able to flex to 90 there was good patellar tracking lateral capsule was not  tight. The patient procedure then done with the computer and rotation the femur a laminar tibial components by x-ray and direct visualization were ideal. Patient did not have abnormal patellar tracking and lateral capsule was not  tight in the lateral nasal release was necessary. Patient was placed in an immobilizer transferred to her room stable condition. Signed Annell Greening M.D.

## 2013-05-14 NOTE — Evaluation (Signed)
Physical Therapy Evaluation Patient Details Name: Russell [name] MRN: [id number] DOB: 12-20-1945 Today's Date: 05/14/2013 Time: 4098-1191 PT Time Calculation (min): 33 min  PT Assessment / Plan / Recommendation History of Present Illness  Pt is a 67 y/o male admitted with L patellar dislocation, 2 weeks post-op from TKA. Pt is now s/p repair of L knee medial capsule.  Clinical Impression  This patient presents with acute pain and decreased functional independence following the above mentioned procedure. At the time of PT eval, pt required supervision for functional mobility, and overall demonstrated good safety awareness with transfers and ambulation. This patient is appropriate for skilled PT interventions to address functional limitations, improve safety and independence with functional mobility, and return to PLOF.     PT Assessment  Patient needs continued PT services    Follow Up Recommendations  Home health PT;Supervision - Intermittent    Does the patient have the potential to tolerate intense rehabilitation      Barriers to Discharge        Equipment Recommendations  None recommended by PT    Recommendations for Other Services     Frequency Min 5X/week    Precautions / Restrictions Precautions Precautions: Knee;Fall Required Braces or Orthoses: Knee Immobilizer - Left Knee Immobilizer - Left: Other (comment) (Not ordered, but used POD #0 as pt had it in room) Restrictions Weight Bearing Restrictions: Yes LLE Weight Bearing: Weight bearing as tolerated   Pertinent Vitals/Pain Pt reports minimal pain at end of session      Mobility  Bed Mobility Bed Mobility: Supine to Sit;Sitting - Scoot to Edge of Bed Supine to Sit: 5: Supervision;HOB flat;With rails Sitting - Scoot to Edge of Bed: 6: Modified independent (Device/Increase time) Details for Bed Mobility Assistance: No physical assist needed Transfers Transfers: Sit to Stand;Stand to Sit Sit to Stand: 5:  Supervision;From bed;With upper extremity assist Stand to Sit: 5: Supervision;To chair/3-in-1;With upper extremity assist Details for Transfer Assistance: No physical assist needed, pt demonstrated proper hand placement and safety awareness. Ambulation/Gait Ambulation/Gait Assistance: 5: Supervision Ambulation Distance (Feet): 125 Feet Assistive device: Rolling walker Ambulation/Gait Assistance Details: VC's for technique at first, however pt demonstrated good gait pattern by end of ambulation Gait Pattern: Step-through pattern Gait velocity: Slightly decreased Stairs: No    Exercises Total Joint Exercises Ankle Circles/Pumps: 10 reps Quad Sets: 10 reps   PT Diagnosis: Difficulty walking  PT Problem List: Decreased strength;Decreased activity tolerance;Decreased range of motion;Decreased balance;Decreased mobility;Decreased knowledge of use of DME;Decreased safety awareness;Pain PT Treatment Interventions: DME instruction;Gait training;Stair training;Functional mobility training;Therapeutic activities;Therapeutic exercise;Neuromuscular re-education;Patient/family education     PT Goals(Current goals can be found in the care plan section) Acute Rehab PT Goals Patient Stated Goal: Continue rehab PT Goal Formulation: With patient Time For Goal Achievement: 05/21/13 Potential to Achieve Goals: Good  Visit Information  Last PT Received On: 05/14/13 Assistance Needed: +1 History of Present Illness: Pt is a 67 y/o male admitted with L patellar dislocation, 2 weeks post-op from TKA. Pt is now s/p repair of L knee medial capsule.       Prior Functioning  Home Living Family/patient expects to be discharged to:: Private residence Living Arrangements: Spouse/significant other Available Help at Discharge: Family;Available 24 hours/day Type of Home: Apartment Home Access: Level entry Home Layout: One level Home Equipment: Walker - 2 wheels;Cane - quad;Crutches;Tub bench Prior  Function Level of Independence: Independent with assistive device(s) Communication Communication: No difficulties Dominant Hand: Right    Cognition  Cognition Arousal/Alertness:  Awake/alert Behavior During Therapy: WFL for tasks assessed/performed Overall Cognitive Status: Within Functional Limits for tasks assessed    Extremity/Trunk Assessment Upper Extremity Assessment Upper Extremity Assessment: Defer to OT evaluation Cervical / Trunk Assessment Cervical / Trunk Assessment: Normal   Balance    End of Session PT - End of Session Equipment Utilized During Treatment: Gait belt;Left knee immobilizer Activity Tolerance: Patient tolerated treatment well Patient left: in chair;with call bell/phone within reach;with family/visitor present Nurse Communication: Mobility status CPM Left Knee CPM Left Knee: Off  GP Functional Assessment Tool Used: Clinical judgement Functional Limitation: Mobility: Walking and moving around Mobility: Walking and Moving Around Current Status [phone]): At least 20 percent but less than 40 percent impaired, limited or restricted Mobility: Walking and Moving Around Goal Status [phone]): At least 20 percent but less than 40 percent impaired, limited or restricted   Ruthann Cancer 05/14/2013, 4:55 PM  Ruthann Cancer, PT, DPT [phone]

## 2013-05-14 NOTE — Anesthesia Preprocedure Evaluation (Addendum)
Anesthesia Evaluation  Patient identified by MRN, date of birth, ID band Patient awake    Reviewed: Allergy & Precautions  History of Anesthesia Complications Negative for: history of anesthetic complications  Airway Mallampati: I TM Distance: >3 FB Neck ROM: Full    Dental  (+) Teeth Intact and Dental Advisory Given   Pulmonary Current Smoker,  breath sounds clear to auscultation  Pulmonary exam normal       Cardiovascular Exercise Tolerance: Good negative cardio ROS  Rhythm:Regular Rate:Normal     Neuro/Psych negative neurological ROS     GI/Hepatic negative GI ROS, Neg liver ROS,   Endo/Other  negative endocrine ROS  Renal/GU Hx kidney stones     Musculoskeletal   Abdominal   Peds  Hematology negative hematology ROS (+)   Anesthesia Other Findings   Reproductive/Obstetrics                          Anesthesia Physical Anesthesia Plan  ASA: II  Anesthesia Plan: General   Post-op Pain Management:    Induction: Intravenous  Airway Management Planned: LMA  Additional Equipment:   Intra-op Plan:   Post-operative Plan:   Informed Consent: I have reviewed the patients History and Physical, chart, labs and discussed the procedure including the risks, benefits and alternatives for the proposed anesthesia with the patient or authorized representative who has indicated his/her understanding and acceptance.   Dental advisory given  Plan Discussed with: CRNA and Anesthesiologist  Anesthesia Plan Comments: (S/P L. TKR 04/30/13 with dislcation of patella Smoker  Plan GA with LMA  Kipp Brood, MD)        Anesthesia Quick Evaluation

## 2013-05-14 NOTE — Plan of Care (Signed)
Problem: Consults Goal: Diagnosis- Total Joint Replacement Primary Total Knee Left     

## 2013-05-14 NOTE — Brief Op Note (Signed)
05/14/2013  10:10 AM  PATIENT:  [name]  67 y.o. male  PRE-OPERATIVE DIAGNOSIS:  Left TKA Patella Disclocation  POST-OPERATIVE DIAGNOSIS:  left total knee arthroplasty patella dislocation  PROCEDURE:  Procedure(s) with comments: Left Total Knee Arthroplasty Medial Retinaculum Repair (Left) - Left Total Knee Arthroplasty Medial Retinaculum Repair  SURGEON:  Surgeon(s) and Role:    * Eldred Manges, MD - Primary  PHYSICIAN ASSISTANT: Maud Deed [facility]  ASSISTANTS: none   ANESTHESIA:   general  EBL:  Total I/O In: 1000 [I.V.:1000] Out: -   BLOOD ADMINISTERED:none  DRAINS: none   LOCAL MEDICATIONS USED:  OTHER  SPECIMEN:  No Specimen  DISPOSITION OF SPECIMEN:  N/A  COUNTS:  YES  TOURNIQUET:   Total Tourniquet Time Documented: Thigh (Left) - 67 minutes Total: Thigh (Left) - 67 minutes   DICTATION: .Note written in EPIC  PLAN OF CARE: Admit for overnight observation  PATIENT DISPOSITION:  PACU - hemodynamically stable.   Delay start of Pharmacological VTE agent (>24hrs) due to surgical blood loss or risk of bleeding: yes

## 2013-05-14 NOTE — Interval H&P Note (Signed)
History and Physical Interval Note:  05/14/2013 8:36 AM  Russell [name]  has presented today for surgery, with the diagnosis of Left TKA Patella Disclocation  The various methods of treatment have been discussed with the patient and family. After consideration of risks, benefits and other options for treatment, the patient has consented to  Procedure(s) with comments: Left Total Knee Arthroplasty Medial Retinaculum Repair (Left) - Left Total Knee Arthroplasty Medial Retinaculum Repair as a surgical intervention .  The patient's history has been reviewed, patient examined, no change in status, stable for surgery.  I have reviewed the patient's chart and labs.  Questions were answered to the patient's satisfaction.     YATES,MARK C

## 2013-05-14 NOTE — H&P (Signed)
Russell [name] is an 67 y.o. male.   Chief Complaint: knee instability after left total knee replacement earlier this month HPI: Pt doing well after total knee arthroplasty and performing daily physical therapy. Shortly after surgery performing at -3 - 85 degree ROM.  Felt  a pop with the knee a couple of days ago with significant change in ROM to 30-90 degrees.  No injury but continued to work with PT.  Seen in office and noted to have lateral patella subluxation with loss of quad strength.  Noted to have retinacular tear which will require repair.  Past Medical History  Diagnosis Date  . Smoker   . Other and unspecified hyperlipidemia   . Hx of colonic polyps   . Hemorrhoids   . Acute prostatitis   . DJD (degenerative joint disease)   . History of skin cancer   . Whole blood donor   . Cancer     basal cell removed  . Kidney stone     Past Surgical History  Procedure Laterality Date  . Tonsillectomy and adenoidectomy    . Skin cancer removed    . Left knee arthroscopy  03/2007  . 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 Lisette Grinder at [facility]  . Eye surgery    . Knee arthroplasty Left 04/30/2013    Procedure: COMPUTER ASSISTED TOTAL KNEE ARTHROPLASTY;  Surgeon: Eldred Manges, MD;  Location: [facility] OR;  Service: Orthopedics;  Laterality: Left;  Left Cemented Total Knee Arthroplasty    History reviewed. No pertinent family history. Social History:  reports that he has been smoking Cigars.  He has never used smokeless tobacco. He reports that he drinks about 1.2 ounces of alcohol per week. He reports that he does not use illicit drugs.  Allergies: No Known Allergies  Medications Prior to Admission  Medication Sig Dispense Refill  . aspirin EC 325 MG tablet Take 1 tablet (325 mg total) by mouth daily.  30 tablet  0  . bisacodyl (DULCOLAX) 5 MG EC tablet Take 10 mg by mouth daily as needed for moderate constipation.      . hydrocortisone (ANUSOL-HC) 2.5 % rectal cream Place 1  application rectally 2 (two) times daily.       . methocarbamol (ROBAXIN) 500 MG tablet Take 1 tablet (500 mg total) by mouth every 6 (six) hours as needed for muscle spasms.  30 tablet  0  . oxyCODONE-acetaminophen (PERCOCET/ROXICET) 5-325 MG per tablet Take 1-2 tablets by mouth every 4 (four) hours as needed for severe pain.  60 tablet  0  . simvastatin (ZOCOR) 40 MG tablet Take 40 mg by mouth every evening.        Results for orders placed during the hospital encounter of 05/14/13 (from the past 48 hour(s))  APTT     Status: None   Collection Time    05/14/13  7:08 AM      Result Value Range   aPTT 30  24 - 37 seconds  CBC     Status: Abnormal   Collection Time    05/14/13  7:08 AM      Result Value Range   WBC 8.7  4.0 - 10.5 K/uL   RBC 3.58 (*) 4.22 - 5.81 MIL/uL   Hemoglobin 12.0 (*) 13.0 - 17.0 g/dL   HCT 40.9 (*) 81.1 - 91.4 %   MCV 97.5  78.0 - 100.0 fL   MCH 33.5  26.0 - 34.0 pg  MCHC 34.4  30.0 - 36.0 g/dL   RDW 40.9  81.1 - 91.4 %   Platelets 392  150 - 400 K/uL  PROTIME-INR     Status: None   Collection Time    05/14/13  7:08 AM      Result Value Range   Prothrombin Time 13.0  11.6 - 15.2 seconds   INR 1.00  0.00 - 1.49  BASIC METABOLIC PANEL     Status: Abnormal   Collection Time    05/14/13  7:08 AM      Result Value Range   Sodium 142  135 - 145 mEq/L   Potassium 4.3  3.5 - 5.1 mEq/L   Chloride 107  96 - 112 mEq/L   CO2 25  19 - 32 mEq/L   Glucose, Bld 111 (*) 70 - 99 mg/dL   BUN 21  6 - 23 mg/dL   Creatinine, Ser 7.82  0.50 - 1.35 mg/dL   Calcium 9.1  8.4 - 95.6 mg/dL   GFR calc non Af Amer >90  >90 mL/min   GFR calc Af Amer >90  >90 mL/min   Comment: (NOTE)     The eGFR has been calculated using the CKD EPI equation.     This calculation has not been validated in all clinical situations.     eGFR's persistently <90 mL/min signify possible Chronic Kidney     Disease.   No results found.  Review of Systems  Musculoskeletal:       Patella  subluxation worsening with PT post TKA  All other systems reviewed and are negative.    Blood pressure 144/96, pulse 53, temperature 97.5 F (36.4 C), temperature source Oral, resp. rate 18, SpO2 100.00%. Physical Exam  Constitutional: He is oriented to person, place, and time. He appears well-developed and well-nourished.  HENT:  Head: Normocephalic and atraumatic.  Eyes: EOM are normal. Pupils are equal, round, and reactive to light.  Neck: Normal range of motion.  Cardiovascular: Normal rate.   Respiratory: Effort normal.  GI: Soft.  Musculoskeletal:  Left knee with lateral patella dislocation .  ROM 30-90 degrees.  Weak quad   Neurological: He is alert and oriented to person, place, and time.  Skin: Skin is warm and dry.  Psychiatric: He has a normal mood and affect.     Assessment/Plan: Lateral patella dislocation left knee.  S/P left total knee replacement.  PLAN:  Revision of left knee retinaculum and capsular repair.  Janasha Barkalow M 05/14/2013, 8:25 AM

## 2013-05-14 NOTE — Anesthesia Procedure Notes (Signed)
Procedure Name: LMA Insertion Date/Time: 05/14/2013 8:40 AM Performed by: Angelica Pou Pre-anesthesia Checklist: Patient identified, Patient being monitored, Emergency Drugs available, Timeout performed and Suction available Patient Re-evaluated:Patient Re-evaluated prior to inductionOxygen Delivery Method: Circle system utilized Preoxygenation: Pre-oxygenation with 100% oxygen Intubation Type: IV induction LMA: LMA inserted LMA Size: 4.0 Number of attempts: 1 Placement Confirmation: positive ETCO2 and breath sounds checked- equal and bilateral Tube secured with: Tape Dental Injury: Teeth and Oropharynx as per pre-operative assessment  Comments: Smooth IV induction by Dr Noreene Larsson; easy atraumatic LMA placement by Pasty Arch CRNA.

## 2013-05-14 NOTE — Anesthesia Postprocedure Evaluation (Signed)
  Anesthesia Post-op Note  Patient: Russell [name]  Procedure(s) Performed: Procedure(s) with comments: Left Total Knee Arthroplasty Medial Retinaculum Repair (Left) - Left Total Knee Arthroplasty Medial Retinaculum Repair  Patient Location: PACU  Anesthesia Type:General  Level of Consciousness: awake, alert , oriented and patient cooperative  Airway and Oxygen Therapy: Patient Spontanous Breathing  Post-op Pain: mild  Post-op Assessment: Post-op Vital signs reviewed, Patient's Cardiovascular Status Stable, Respiratory Function Stable, Patent Airway, No signs of Nausea or vomiting and Pain level controlled  Post-op Vital Signs: stable  Complications: No apparent anesthesia complications

## 2013-05-14 NOTE — Preoperative (Signed)
Beta Blockers   Reason not to administer Beta Blockers:Not Applicable 

## 2013-05-14 NOTE — Plan of Care (Signed)
Problem: Consults Goal: Diagnosis- Total Joint Replacement Revision Total Knee: Left     

## 2013-05-14 NOTE — Progress Notes (Signed)
Orthopedic Tech Progress Note Patient Details:  Russell [name] [birth year] [id number]  CPM Left Knee CPM Left Knee: On Left Knee Flexion (Degrees): 40 Left Knee Extension (Degrees): 0 Additional Comments: Trapeze bar   Shawnie Pons 05/14/2013, 11:25 AM

## 2013-05-15 NOTE — Progress Notes (Signed)
Per Dr Lajoyce Corners and Dr. Ophelia Charter' order. Ace dressing intact to LLE. Pt plans to take a laxative/suppository when he gets home for constipation. D/C teachings done. Pt and wife both verb understanding of all teachings.

## 2013-05-15 NOTE — Progress Notes (Signed)
Physical Therapy Treatment Patient Details Name: Russell [name] MRN: [id number] DOB: [birth year] Today's Date: 05/15/2013 Time: 4098-1191 PT Time Calculation (min): 21 min  PT Assessment / Plan / Recommendation  History of Present Illness Pt is a 67 y/o male admitted with L patellar dislocation, 2 weeks post-op from TKA. Pt is now s/p repair of L knee medial capsule.   PT Comments   Pt moving very well.  States he is a little apprehensive about performing ROM exercises but agreeable to do gentle ROM (self AAROM sitting EOB).     Follow Up Recommendations  Home health PT;Supervision - Intermittent     Does the patient have the potential to tolerate intense rehabilitation     Barriers to Discharge        Equipment Recommendations  None recommended by PT    Recommendations for Other Services    Frequency Min 5X/week   Progress towards PT Goals Progress towards PT goals: Progressing toward goals  Plan Current plan remains appropriate    Precautions / Restrictions Precautions Precautions: Knee;Fall Restrictions LLE Weight Bearing: Weight bearing as tolerated   Pertinent Vitals/Pain 5/10 Lt knee.  Premedicated.  Repositioned for comfort.      Mobility  Bed Mobility Bed Mobility: Supine to Sit;Sitting - Scoot to Edge of Bed Supine to Sit: 6: Modified independent (Device/Increase time) Sitting - Scoot to Edge of Bed: 6: Modified independent (Device/Increase time) Transfers Transfers: Sit to Stand;Stand to Sit Sit to Stand: 6: Modified independent (Device/Increase time);With upper extremity assist;From bed Stand to Sit: 6: Modified independent (Device/Increase time);With upper extremity assist;With armrests;To chair/3-in-1 Details for Transfer Assistance: Cues for safety as pt stood without having RW in front of him Ambulation/Gait Ambulation/Gait Assistance: 5: Supervision Ambulation Distance (Feet): 200 Feet Assistive device: Rolling walker Ambulation/Gait Assistance  Details: supervision for safety.   Gait Pattern: Step-through pattern Stairs: No Wheelchair Mobility Wheelchair Mobility: No    Exercises Total Joint Exercises Knee Flexion: AROM;5 reps;Seated (gentle self AAROM sitting EOB)     PT Goals (current goals can now be found in the care plan section) Acute Rehab PT Goals Patient Stated Goal: Continue rehab PT Goal Formulation: With patient Time For Goal Achievement: 05/21/13 Potential to Achieve Goals: Good  Visit Information  Last PT Received On: 05/15/13 Assistance Needed: +1 History of Present Illness: Pt is a 67 y/o male admitted with L patellar dislocation, 2 weeks post-op from TKA. Pt is now s/p repair of L knee medial capsule.    Subjective Data  Patient Stated Goal: Continue rehab   Cognition  Cognition Arousal/Alertness: Awake/alert Behavior During Therapy: WFL for tasks assessed/performed Overall Cognitive Status: Within Functional Limits for tasks assessed    Balance     End of Session PT - End of Session Activity Tolerance: Patient tolerated treatment well Patient left: in chair;with call bell/phone within reach Nurse Communication: Mobility status   GP     Lara Mulch 05/15/2013, 8:39 AM  Verdell Face, PTA [phone] 05/15/2013

## 2013-05-15 NOTE — Discharge Summary (Signed)
Physician Discharge Summary  Patient ID: Russell [name] MRN: [id number] DOB/AGE: 1946-03-01 67 y.o.  Admit date: 05/14/2013 Discharge date: 05/15/2013  Admission Diagnoses: Left patella dislocation status post total knee arthroplasty  Discharge Diagnoses: Left patella dislocation status post total knee arthroplasty Active Problems:   Closed patellar dislocation   Discharged Condition: stable  Hospital Course: Patient had dislocation of his patella status post total knee arthroplasty. He underwent retinacular reconstruction patient progressed well was discharged to home.  Consults: None  Significant Diagnostic Studies: labs: Routine labs  Treatments: surgery: See operative note  Discharge Exam: Blood pressure 152/88, pulse 67, temperature 98.8 F (37.1 C), temperature source Oral, resp. rate 16, SpO2 97.00%. Incision/Wound: dressing clean and dry  Disposition: 06-Home-Health Care Svc  Discharge Orders   Future Appointments Provider Department Dept Phone   03/19/2014 9:00 AM Michele Mcalpine, MD [facility] [phone]   Future Orders Complete By Expires   Call MD / Call 911  As directed    Comments:     If you experience chest pain or shortness of breath, CALL 911 and be transported to the hospital emergency room.  If you develope a fever above 101 F, pus (white drainage) or increased drainage or redness at the wound, or calf pain, call your surgeon's office.   Constipation Prevention  As directed    Comments:     Drink plenty of fluids.  Prune juice may be helpful.  You may use a stool softener, such as Colace (over the counter) 100 mg twice a day.  Use MiraLax (over the counter) for constipation as needed.   Diet - low sodium heart healthy  As directed    Increase activity slowly as tolerated  As directed        Medication List         aspirin EC 325 MG tablet  Take 1 tablet (325 mg total) by mouth daily.     bisacodyl 5 MG EC tablet  Commonly known as:   DULCOLAX  Take 10 mg by mouth daily as needed for moderate constipation.     hydrocortisone 2.5 % rectal cream  Commonly known as:  ANUSOL-HC  Place 1 application rectally 2 (two) times daily.     methocarbamol 500 MG tablet  Commonly known as:  ROBAXIN  Take 1 tablet (500 mg total) by mouth every 6 (six) hours as needed for muscle spasms.     oxyCODONE-acetaminophen 5-325 MG per tablet  Commonly known as:  PERCOCET/ROXICET  Take 1-2 tablets by mouth every 4 (four) hours as needed for severe pain.     oxyCODONE-acetaminophen 5-325 MG per tablet  Commonly known as:  ROXICET  Take 1-2 tablets by mouth every 4 (four) hours as needed for severe pain.     simvastatin 40 MG tablet  Commonly known as:  ZOCOR  Take 40 mg by mouth every evening.           Follow-up Information   Follow up with Eldred Manges, MD. Schedule an appointment as soon as possible for a visit in 1 week.   Specialty:  Orthopedic Surgery   Contact information:   [address] [place] [address] [phone]       Follow up with Eldred Manges, MD In 1 week.   Specialty:  Orthopedic Surgery   Contact information:   [address] [place] [address] [phone]       Signed: Nadara Mustard 05/15/2013, 7:52 AM

## 2013-05-16 NOTE — Care Management Utilization Note (Signed)
Utilization review completed. Alyissa Whidbee, RN BSN 

## 2013-05-16 NOTE — Progress Notes (Signed)
   CARE MANAGEMENT NOTE 05/16/2013  Patient:  Russell [name], Russell [name]   Account Number:  [id number]  Date Initiated:  05/16/2013  Documentation initiated by:  [facility]  Subjective/Objective Assessment:   Left Total Knee Arthroplasty Medial Retinaculum Repair (Left) - Left Total Knee Arthroplasty Medial Retinaculum Repa     Action/Plan:   Anticipated DC Date:  05/15/2013   Anticipated DC Plan:  HOME W HOME HEALTH SERVICES      DC Planning Services  CM consult      Choice offered to / List presented to:          [facility] arranged  [facility]      [facility] agency  [facility]   Status of service:  Completed, signed off Medicare Important Message given?   (If response is "NO", the following Medicare IM given date fields will be blank) Date Medicare IM given:   Date Additional Medicare IM given:    Discharge Disposition:  HOME W HOME HEALTH SERVICES  Per UR Regulation:    If discussed at Long Length of Stay Meetings, dates discussed:    Comments:  05/16/2013 0920 NCM spoke to [facility] rep and pt was active prior to hospital stay with HHPT. Resumption of care requested for [facility] PT. Isidoro Donning RN CCM Case Mgmt phone [phone]

## 2013-05-17 DIAGNOSIS — Z471 Aftercare following joint replacement surgery: Secondary | ICD-10-CM | POA: Diagnosis not present

## 2013-05-17 DIAGNOSIS — Z96659 Presence of unspecified artificial knee joint: Secondary | ICD-10-CM | POA: Diagnosis not present

## 2013-05-17 DIAGNOSIS — IMO0001 Reserved for inherently not codable concepts without codable children: Secondary | ICD-10-CM | POA: Diagnosis not present

## 2013-05-19 ENCOUNTER — Encounter (HOSPITAL_COMMUNITY): Payer: Self-pay | Admitting: Orthopaedic Surgery

## 2013-05-20 DIAGNOSIS — Z471 Aftercare following joint replacement surgery: Secondary | ICD-10-CM | POA: Diagnosis not present

## 2013-05-20 DIAGNOSIS — Z96659 Presence of unspecified artificial knee joint: Secondary | ICD-10-CM | POA: Diagnosis not present

## 2013-05-20 DIAGNOSIS — IMO0001 Reserved for inherently not codable concepts without codable children: Secondary | ICD-10-CM | POA: Diagnosis not present

## 2013-05-22 DIAGNOSIS — Z96659 Presence of unspecified artificial knee joint: Secondary | ICD-10-CM | POA: Diagnosis not present

## 2013-05-22 DIAGNOSIS — IMO0001 Reserved for inherently not codable concepts without codable children: Secondary | ICD-10-CM | POA: Diagnosis not present

## 2013-05-22 DIAGNOSIS — Z471 Aftercare following joint replacement surgery: Secondary | ICD-10-CM | POA: Diagnosis not present

## 2013-05-23 DIAGNOSIS — IMO0001 Reserved for inherently not codable concepts without codable children: Secondary | ICD-10-CM | POA: Diagnosis not present

## 2013-05-23 DIAGNOSIS — Z471 Aftercare following joint replacement surgery: Secondary | ICD-10-CM | POA: Diagnosis not present

## 2013-05-23 DIAGNOSIS — Z96659 Presence of unspecified artificial knee joint: Secondary | ICD-10-CM | POA: Diagnosis not present

## 2013-05-28 DIAGNOSIS — M25569 Pain in unspecified knee: Secondary | ICD-10-CM | POA: Diagnosis not present

## 2013-05-28 DIAGNOSIS — Z96659 Presence of unspecified artificial knee joint: Secondary | ICD-10-CM | POA: Diagnosis not present

## 2013-05-28 DIAGNOSIS — M25669 Stiffness of unspecified knee, not elsewhere classified: Secondary | ICD-10-CM | POA: Diagnosis not present

## 2013-06-04 DIAGNOSIS — M25669 Stiffness of unspecified knee, not elsewhere classified: Secondary | ICD-10-CM | POA: Diagnosis not present

## 2013-06-04 DIAGNOSIS — M25569 Pain in unspecified knee: Secondary | ICD-10-CM | POA: Diagnosis not present

## 2013-06-04 DIAGNOSIS — Z96659 Presence of unspecified artificial knee joint: Secondary | ICD-10-CM | POA: Diagnosis not present

## 2013-06-06 DIAGNOSIS — Z96659 Presence of unspecified artificial knee joint: Secondary | ICD-10-CM | POA: Diagnosis not present

## 2013-06-06 DIAGNOSIS — M25669 Stiffness of unspecified knee, not elsewhere classified: Secondary | ICD-10-CM | POA: Diagnosis not present

## 2013-06-06 DIAGNOSIS — M25569 Pain in unspecified knee: Secondary | ICD-10-CM | POA: Diagnosis not present

## 2013-06-11 DIAGNOSIS — M25569 Pain in unspecified knee: Secondary | ICD-10-CM | POA: Diagnosis not present

## 2013-06-18 DIAGNOSIS — M25569 Pain in unspecified knee: Secondary | ICD-10-CM | POA: Diagnosis not present

## 2013-06-18 DIAGNOSIS — S83006A Unspecified dislocation of unspecified patella, initial encounter: Secondary | ICD-10-CM | POA: Diagnosis not present

## 2013-06-18 DIAGNOSIS — M238X9 Other internal derangements of unspecified knee: Secondary | ICD-10-CM | POA: Diagnosis not present

## 2013-06-18 DIAGNOSIS — Z96659 Presence of unspecified artificial knee joint: Secondary | ICD-10-CM | POA: Diagnosis not present

## 2013-08-20 DIAGNOSIS — L570 Actinic keratosis: Secondary | ICD-10-CM | POA: Diagnosis not present

## 2013-08-20 DIAGNOSIS — L821 Other seborrheic keratosis: Secondary | ICD-10-CM | POA: Diagnosis not present

## 2013-08-20 DIAGNOSIS — C44519 Basal cell carcinoma of skin of other part of trunk: Secondary | ICD-10-CM | POA: Diagnosis not present

## 2013-08-20 DIAGNOSIS — D235 Other benign neoplasm of skin of trunk: Secondary | ICD-10-CM | POA: Diagnosis not present

## 2013-10-08 DIAGNOSIS — Z85828 Personal history of other malignant neoplasm of skin: Secondary | ICD-10-CM | POA: Diagnosis not present

## 2014-03-19 ENCOUNTER — Ambulatory Visit (INDEPENDENT_AMBULATORY_CARE_PROVIDER_SITE_OTHER)
Admission: RE | Admit: 2014-03-19 | Discharge: 2014-03-19 | Disposition: A | Discharge status: Home / Self Care | Payer: Medicare Other | Source: Ambulatory Visit | Attending: Pulmonary Disease | Admitting: Pulmonary Disease

## 2014-03-19 ENCOUNTER — Encounter: Payer: Self-pay | Admitting: Pulmonary Disease

## 2014-03-19 ENCOUNTER — Ambulatory Visit (INDEPENDENT_AMBULATORY_CARE_PROVIDER_SITE_OTHER): Payer: Medicare Other | Admitting: Pulmonary Disease

## 2014-03-19 ENCOUNTER — Other Ambulatory Visit (INDEPENDENT_AMBULATORY_CARE_PROVIDER_SITE_OTHER): Payer: Medicare Other

## 2014-03-19 VITALS — BP 122/80 | HR 53 | Temp 97.6°F | Ht 72.0 in | Wt 191.2 lb

## 2014-03-19 DIAGNOSIS — F17200 Nicotine dependence, unspecified, uncomplicated: Secondary | ICD-10-CM

## 2014-03-19 DIAGNOSIS — N320 Bladder-neck obstruction: Secondary | ICD-10-CM

## 2014-03-19 DIAGNOSIS — D126 Benign neoplasm of colon, unspecified: Secondary | ICD-10-CM | POA: Diagnosis not present

## 2014-03-19 DIAGNOSIS — Z0000 Encounter for general adult medical examination without abnormal findings: Secondary | ICD-10-CM

## 2014-03-19 DIAGNOSIS — E7800 Pure hypercholesterolemia, unspecified: Secondary | ICD-10-CM

## 2014-03-19 DIAGNOSIS — Z87891 Personal history of nicotine dependence: Secondary | ICD-10-CM | POA: Diagnosis not present

## 2014-03-19 DIAGNOSIS — F419 Anxiety disorder, unspecified: Secondary | ICD-10-CM

## 2014-03-19 DIAGNOSIS — Z720 Tobacco use: Secondary | ICD-10-CM | POA: Diagnosis not present

## 2014-03-19 LAB — CBC WITH DIFFERENTIAL/PLATELET
Basophils Absolute: 0 10*3/uL (ref 0.0–0.1)
Basophils Relative: 0.4 % (ref 0.0–3.0)
EOS PCT: 6.1 % — AB (ref 0.0–5.0)
Eosinophils Absolute: 0.5 10*3/uL (ref 0.0–0.7)
HCT: 45.5 % (ref 39.0–52.0)
Hemoglobin: 15.5 g/dL (ref 13.0–17.0)
Lymphocytes Relative: 27.7 % (ref 12.0–46.0)
Lymphs Abs: 2.1 10*3/uL (ref 0.7–4.0)
MCHC: 34.2 g/dL (ref 30.0–36.0)
MCV: 93.9 fl (ref 78.0–100.0)
MONO ABS: 0.5 10*3/uL (ref 0.1–1.0)
MONOS PCT: 6.4 % (ref 3.0–12.0)
Neutro Abs: 4.6 10*3/uL (ref 1.4–7.7)
Neutrophils Relative %: 59.4 % (ref 43.0–77.0)
PLATELETS: 206 10*3/uL (ref 150.0–400.0)
RBC: 4.84 Mil/uL (ref 4.22–5.81)
RDW: 13.7 % (ref 11.5–15.5)
WBC: 7.8 10*3/uL (ref 4.0–10.5)

## 2014-03-19 LAB — LIPID PANEL
CHOLESTEROL: 150 mg/dL (ref 0–200)
HDL: 56.2 mg/dL (ref >39.00)
LDL Cholesterol: 83 mg/dL (ref 0–99)
NonHDL: 93.8
TRIGLYCERIDES: 53 mg/dL (ref 0.0–149.0)
Total CHOL/HDL Ratio: 3
VLDL: 10.6 mg/dL (ref 0.0–40.0)

## 2014-03-19 LAB — BASIC METABOLIC PANEL
BUN: 18 mg/dL (ref 6–23)
CHLORIDE: 108 meq/L (ref 96–112)
CO2: 28 mEq/L (ref 19–32)
Calcium: 9.3 mg/dL (ref 8.4–10.5)
Creatinine, Ser: 0.9 mg/dL (ref 0.4–1.5)
GFR: 94.53 mL/min (ref >60.00)
GLUCOSE: 88 mg/dL (ref 70–99)
POTASSIUM: 4.7 meq/L (ref 3.5–5.1)
Sodium: 141 mEq/L (ref 135–145)

## 2014-03-19 LAB — HEPATIC FUNCTION PANEL
ALBUMIN: 4.2 g/dL (ref 3.5–5.2)
ALT: 27 U/L (ref 0–53)
AST: 23 U/L (ref 0–37)
Alkaline Phosphatase: 67 U/L (ref 39–117)
Bilirubin, Direct: 0.2 mg/dL (ref 0.0–0.3)
Total Bilirubin: 0.9 mg/dL (ref 0.2–1.2)
Total Protein: 7.1 g/dL (ref 6.0–8.3)

## 2014-03-19 LAB — PSA: PSA: 1.73 ng/mL (ref 0.10–4.00)

## 2014-03-19 LAB — TSH: TSH: 0.85 u[IU]/mL (ref 0.35–4.50)

## 2014-03-19 MED ORDER — SIMVASTATIN 40 MG PO TABS: 40.0000 mg | ORAL_TABLET | Freq: Every evening | ORAL | Status: DC

## 2014-03-19 NOTE — Progress Notes (Signed)
Subjective:     Patient ID: Russell [name], male   DOB: Apr 05, 1946, 68 y.o.   MRN: [id number]  HPI 68 y/o WM here for a follow up visit...   ~  August 22, 2010:  Yearly follow up- doing well overall notes some left knee pain (he works out in gym 5d/wk +golf etc & uses Advil Prn), occas nasal drainage & epistaxis (wife made appt w/ ENT later this week- we discussed saline)...    On Simva40 for Chol & FLP 3/12 shows TChol 157, TG 54, HDL 54, LDL 92> continue same +diet etc...  EKG= SBrady 54/min, WNL.Marland KitchenMarland Kitchen CPX labs all look good (he didn't go for his XRay today)...  ~  March 19, 2012:  19 month ROV & CPX> Russell [name] has had a good interval except for the right kidney stone 7-8/12, treated via ER & was able to pass, then seen by Urology (per pt hx we do not have notes & nothing in Epic), sounds like calc oxalate stones w/ rec for dietary adjust- no colas, tea, etc & increase citrate; he has been doing well since then & no known recurrence...    His only meds are ASA81 and Simva40> tol well & FLP today is good- all parameters at goals, continue same...  His BP is sl elev today at 160/88 but he's had a stressful morn, & BP's at his last two blood donor appts= 110-120/ 60-70's;  He exercises regularly w/o difficulty...    We reviewed prob list, meds, xrays and labs> see below for updates >> he'll get flu vaccine next week...  CXR 10/13 showed normal heart size, clear lungs, tiny granuloma left base, NAD...  LABS 10/13:  FLP- at goals on Simva40;  Chems- wnl;  CBC- wnl;  TSH=0.83;  PSA= 1.23;  Urine- clear  ~  March 19, 2013:  Yearly ROV & Russell [name] is doing well x for some left knee pain & he has appt w/ Ortho next week; a business assoc recently passed from metastatic colon ca; he exercises regularly- 5d per week at sport time... We reviewed the following medical problems during today's office visit >>     Smoker & Abn CXR> he smokes cigars on the golf course & has a sm LLL granuloma on CXR; he remains  asymptomatic w/o cough, sput, SOB, CP, etc...    CHOL> on Simva40; prev LDL as high as 150 range on diet alone; FLP 10/14 shows TChol 149, TG 28, HDL 62, LDL 81    GI- divertics, colon polyps & hems> grandfather had colon cancer; pt noted rectal bleeding 5/14 & saw DrPerry in f/u> colonoscopy done 6/14 w/ divertics, hems, & 4 polyps- tubulovillous adenomas & f/u planned 3-107yrs    GU- hx prostatitis & kid stones> had stone 2012 able to pass it; Urology eval by DrOttelin & diet adjust; no recurrent problem since then...    DJD> he has seen DrYates in the past & had left knee arthroscopy; says he was told bone on bone & would need replacement; he takes 3 Advil prior to golfing...    Hx skin cancer> his friend and business partner died from metastatic melanoma; pt has DrHall check his skin every 70mo...    Blood donor> his has donated over 150 pts he says; continues to donate regularly; labs 10/14 showed Hg= 15.3 We reviewed prob list, meds, xrays and labs> see below for updates >>   CXR 10/14 showed normal heart size, clear lungs, 34mm granuloma LLL, NAD.Marland KitchenMarland Kitchen  EKG-  SBrady, rate54, wnl, NAD...  LABS 10/14:  FLP- at goals on Simva40;  Chems- wnl;  CBC- wnl;  TSH=1.14;  PSA=1.43...   ~  March 19, 2014:  Yearly ROV & review of medical problems> Russell [name] had his left TKR 11/14 by DrYates then had complic w/ patella dislocation requiring a se4cond surg by DrDuda 2 wks later; this was followed by rehab & now back to normal & he is very pleased- fully recovered, playing golf, etc... 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 We reviewed the following medical problems during today's office visit >>     Smoker & Abn CXR> he smokes cigars on the golf course & has a sm LLL granuloma on CXR; he remains asymptomatic w/o cough, sput, SOB, CP, etc...    CHOL> on Simva40; prev LDL as high as 150 range on diet alone; FLP 10/15 shows TChol 150, TG 53, HDL 56, LDL  83    GI- divertics, colon polyps & hems> grandfather had colon cancer; pt noted rectal bleeding 5/14 & saw DrPerry in f/u> colonoscopy done 6/14 w/ divertics, hems, & 4 polyps- tubulovillous adenomas & f/u planned 3-9yrs    GU- hx prostatitis & kid stones> had stone 2012 able to pass it; Urology eval by DrOttelin & diet adjust; no recurrent problem since then...    DJD> he has seen DrYates in the past & had left knee arthroscopy; finally had left TKR 65/78 w/ complic (patella dislocation) requiring surg 2wks later; s/p rehab & back to normal now...    Hx skin cancer> his friend and business partner died from metastatic melanoma; pt has DrHall check his skin every 2mo...    Blood donor> his has donated over 150 pts he says; continues to donate regularly; labs 10/14 showed Hg= 15.3 We reviewed prob list, meds, xrays and labs> see below for updates >> he will get Flu shot at work in 2 wks...   CXR 10/15 showed norm heart size, mild tortuosity of Ao, clear lungs, NAD...  LABS 10/15:  FLP- at goals on Simva40;  Chems- wnl;  CBC- wnl;  TSH=0.85;  PSA=1.73...          Problem List:   SMOKER (ICD-305.1) - he is a cigar smoker and states that he does not inhale... smokers only occas "when I play golf or go to the bar"... ~  CXR 3/12 showed norm heart size, mild interstitial thickening, 9mm calc granuloma left lung base. ~  CTAbd 7/12 in ER for kidney stone showed incidental mild bullous changes at lung bases & bibasilar atx; also atherosclerotic changes in Ao & branches w/o aneurysm.  ~  CXR 10/13 showed tiny calcif granuloma at left lung base, norm heart size, otherw clear lungs... ~  CXR 10/14 showed normal heart size, clear lungs, 33mm granuloma LLL, NAD ~  CXR 10/15 showed norm heart size, mild tortuosity of Ao, clear lungs, NAD  HYPERCHOLESTEROLEMIA, BORDERLINE (ICD-272.4) - on diet Rx alone & prev not interested in med Rx... now he states he will consider medication if no better on his diet... ~   [place] 11/06 showed TChol 210, Tg 41, HDL 59, LDL 138... pt refers diet Rx. ~  [place] 12/09 showed TChol 219, Tg 43, HDL 53, LDL 141... rec> start Simva but he refuses, prefers diet. ~  FLP 12/10 showed TChol  222, TG 111, HDL 53, LDL 153... rec> start Simva40. ~  [place] 3/11 on Simva40 showed  TChol 141, TG 36, HDL 59, LDL 75... continue Simva40. ~  [place] 3/12 on Simva40 showed TChol 157, TG 54, HDL 54, LDL 92 ~  FLP 10/13 on Simva40 showed TChol 163, TG 35, HDL 73, LDL 83 ~  FLP 10/14 on Simva40 showed TChol 149, TG 28, HDL 62, LDL 81  ~  FLP 10/15 on Simva40 showed TChol 150, TG 53, HDL 56, LDL 83  Hx of COLONIC POLYPS (ICD-211.3), & HEMORRHOIDS (ICD-455.6) - + FamHx colon cancer in his grandfather... last colonoscopy 2/08 by DrPerry showed 12mm polyp & hems... path= polypoid mucosa w/o adenomatous change... f/u planned 14yrs. ~  grandfather had colon cancer; pt noted rectal bleeding 5/14 & saw DrPerry in f/u> colonoscopy done 6/14 w/ divertics, hems, & 4 polyps- tubulovillous adenomas & f/u planned 3-91yrs  Hx of ACUTE PROSTATITIS (ICD-601.0) - prev eval & Rx by DrWrenn... no symptoms...  KIDNEY STONES >>  ~  New onset problem 7/12 & went to ER for treatment; Rx conservatively & he was able to pass stone; followed up w/ Urology, DrOttelin (we don't have notes) & he reports stone was calc oxalate & rec to avoid colas, tea, etc; increase water intake & use citrate when poss... ~  CTAbd 7/12 showed mild bullous changes at lung bases & atx, +gallstones w/o inflamm, mild right hydronephrosis & hydroureter/ 2mm right UVJ stone; mild prostatic enlargement, multilevel DDD in spine, ateriosclerotic calcif in Ao & branches... ~  XRay Abd in ER 8/12 showed mod stool burden, mild lumbar spondylosis, NAD... ~  No known recurrence of stone disease to date...  DEGENERATIVE JOINT DISEASE (ICD-715.90) - he had a prev left knee arthroscopy by DrYates in 2008... he take 3 ADVIL prior to golfing... ~  10/14: states he was  prev told bone on bone in knee & he'd need TKR per DrYates=> done 85/27 w/ complic (patella dislocation) requiring surg 2wks later; s/p rehab & back to normal now...  SKIN CANCER, HX OF (ICD-V10.83) - he sees DrHall for skin check every 6 months... 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 ~  labs 12/09 showed Hg= 15.7 ~  labs 12/10 showed Hg= 16.6 ~  Labs 3/12 showed Hg= 16.1 ~  Labs 10/13 showed Hg= 15.0 ~  Labs 10/14 showed Hg= 15.3 ~  Labs 10/15 showed Hg= 15.5  HEALTH MAINTENANCE:  he takes ASA 81mg  on MWF since he has epistaxis on daily ASA early in 2009... ~  GI:  DrPerry & up to date on screening colonoscopy... ~  GU:  DrWrenn in the past... PSA 10/14 = 1.43 ~  Immuniz:  He gets the yearly flu vaccine... Tetanus shot given in 2006...  Pneumovax due at age 48.   Past Medical History  Diagnosis Date  . Smoker   . Other and unspecified hyperlipidemia   . Hx of colonic polyps   . Hemorrhoids   . Acute prostatitis   . DJD (degenerative joint disease)   . History of skin cancer   . Whole blood donor   . Cancer     basal cell removed  . Kidney stone   . Dislocated patella 04/2013    S/P   ARTHROPLASTY    Past Surgical History  Procedure Laterality Date  . Tonsillectomy and adenoidectomy    . Skin cancer removed    . Left knee arthroscopy  03/2007  .  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 Isaiah Blakes at [facility]  . Eye surgery    . Knee arthroplasty Left 04/30/2013    Procedure: COMPUTER ASSISTED TOTAL KNEE ARTHROPLASTY;  Surgeon: Marybelle Killings, MD;  Location: [place];  Service: Orthopedics;  Laterality: Left;  Left Cemented Total Knee Arthroplasty  . Patellar tendon repair Left 05/14/2013    Procedure: Left Total Knee Arthroplasty Medial Retinaculum Repair;  Surgeon: Marybelle Killings, MD;  Location: [place];  Service: Orthopedics;   Laterality: Left;  Left Total Knee Arthroplasty Medial Retinaculum Repair    Outpatient Encounter Prescriptions as of 03/19/2014  Medication Sig  . aspirin 81 MG tablet 1 tablet by mouth three times per week.  . Multiple Vitamins-Minerals (MULTIVITAMIN ADULTS 50+ PO) Take 1 tablet by mouth daily.  . simvastatin (ZOCOR) 40 MG tablet Take 40 mg by mouth every evening.  . [DISCONTINUED] aspirin EC 325 MG tablet Take 1 tablet (325 mg total) by mouth daily.  . [DISCONTINUED] bisacodyl (DULCOLAX) 5 MG EC tablet Take 10 mg by mouth daily as needed for moderate constipation.  . [DISCONTINUED] hydrocortisone (ANUSOL-HC) 2.5 % rectal cream Place 1 application rectally 2 (two) times daily.   . [DISCONTINUED] methocarbamol (ROBAXIN) 500 MG tablet Take 1 tablet (500 mg total) by mouth every 6 (six) hours as needed for muscle spasms.  . [DISCONTINUED] oxyCODONE-acetaminophen (PERCOCET/ROXICET) 5-325 MG per tablet Take 1-2 tablets by mouth every 4 (four) hours as needed for severe pain.  . [DISCONTINUED] oxyCODONE-acetaminophen (ROXICET) 5-325 MG per tablet Take 1-2 tablets by mouth every 4 (four) hours as needed for severe pain.    No Known Allergies   Current Medications, Allergies, Past Medical History, Past Surgical History, Family History, and Social History were reviewed in [employer] record.   Review of Systems         The patient complains of joint pain and arthritis.  The patient denies fever, chills, sweats, anorexia, fatigue, weakness, malaise, weight loss, sleep disorder, blurring, diplopia, eye irritation, eye discharge, vision loss, eye pain, photophobia, earache, ear discharge, tinnitus, decreased hearing, nasal congestion, nosebleeds, sore throat, hoarseness, chest pain, palpitations, syncope, dyspnea on exertion, orthopnea, PND, peripheral edema, cough, dyspnea at rest, excessive sputum, hemoptysis, wheezing, pleurisy, nausea, vomiting, diarrhea, constipation, change  in bowel habits, abdominal pain, melena, hematochezia, jaundice, gas/bloating, indigestion/heartburn, dysphagia, odynophagia, dysuria, hematuria, urinary frequency, urinary hesitancy, nocturia, incontinence, back pain, joint swelling, muscle cramps, muscle weakness, stiffness, sciatica, restless legs, leg pain at night, leg pain with exertion, rash, itching, dryness, suspicious lesions, paralysis, paresthesias, seizures, tremors, vertigo, transient blindness, frequent falls, frequent headaches, difficulty walking, depression, anxiety, memory loss, confusion, cold intolerance, heat intolerance, polydipsia, polyphagia, polyuria, unusual weight change, abnormal bruising, bleeding, enlarged lymph nodes, urticaria, allergic rash, hay fever, and recurrent infections.     Objective:   Physical Exam    WD, WN, 68 y/o WM in NAD...  GENERAL:  Alert & oriented; pleasant & cooperative... HEENT:  [place]/AT, EOM-wnl, PERRLA, EACs-clear, TMs-wnl, NOSE-clear, THROAT-clear & wnl. NECK:  Supple w/ full ROM; no JVD; normal carotid impulses w/o bruits; no thyromegaly or nodules palpated; no lymphadenopathy. CHEST:  Clear to P & A; without wheezes/ rales/ or rhonchi. HEART:  Regular Rhythm; without murmurs/ rubs/ or gallops. ABDOMEN:  Soft & nontender; normal bowel sounds; no organomegaly or masses detected. RECTAL:  Neg - prostate 2+ & nontender w/o nodules; stool hematest neg. EXT: without deformities, mild arthritic changes; no varicose veins/  venous insuffic/ or edema. NEURO:  CN's intact; motor testing normal; sensory testing normal; gait normal & balance OK. DERM:  No lesions noted; no rash etc...  RADIOLOGY DATA:  Reviewed in the EPIC EMR & discussed w/ the patient...  LABORATORY DATA:  Reviewed in the EPIC EMR & discussed w/ the patient...   Assessment:      Cigar SMOKER>  CXR is stable, NAD; 35mm calcif granuloma noted in LLL; CTAbd in 2012 showed incidental bullous changes at lung bases; must quit all  smoking...  CHOL>  FLP looks good on Simva40; continue same + diet...  Hx Colon Polyps>  followed by DrPerry & f/u colon done 6/14 by DrPerry- 4 sm polyps removed & 3 were tubulovillous adenomas- f/u colon planned 44yrs per GI...  Hx Kid Stones & Prostatitis in past>  No recurrent kid stones or prostate symptoms; PSA= 1.43  DJD>  Followed by Avel Peace; s/p left TKR & doing well...  Skin Ca>  Followed by Natale Milch & pt reports doing OK...   Whole Blood Donor>  A+ blood type & has given ~150 pints to date...     Plan:     Patient's Medications  New Prescriptions   No medications on file  Previous Medications   ASPIRIN 81 MG TABLET    1 tablet by mouth three times per week.   MULTIPLE VITAMINS-MINERALS (MULTIVITAMIN ADULTS 50+ PO)    Take 1 tablet by mouth daily.  Modified Medications   Modified Medication Previous Medication   SIMVASTATIN (ZOCOR) 40 MG TABLET simvastatin (ZOCOR) 40 MG tablet      Take 1 tablet (40 mg total) by mouth every evening.    Take 40 mg by mouth every evening.  Discontinued Medications   ASPIRIN EC 325 MG TABLET    Take 1 tablet (325 mg total) by mouth daily.   BISACODYL (DULCOLAX) 5 MG EC TABLET    Take 10 mg by mouth daily as needed for moderate constipation.   HYDROCORTISONE (ANUSOL-HC) 2.5 % RECTAL CREAM    Place 1 application rectally 2 (two) times daily.    METHOCARBAMOL (ROBAXIN) 500 MG TABLET    Take 1 tablet (500 mg total) by mouth every 6 (six) hours as needed for muscle spasms.   OXYCODONE-ACETAMINOPHEN (PERCOCET/ROXICET) 5-325 MG PER TABLET    Take 1-2 tablets by mouth every 4 (four) hours as needed for severe pain.   OXYCODONE-ACETAMINOPHEN (ROXICET) 5-325 MG PER TABLET    Take 1-2 tablets by mouth every 4 (four) hours as needed for severe pain.

## 2014-03-19 NOTE — Patient Instructions (Signed)
Today we updated your med list in our EPIC system...    Continue your current medications the same...  Today we did your follow up CXR & FASTING blood work...    We will contact you w/ the results when available...   Keep up the great job w/ diet & exercise...  Call for any questions...  Let's plan a follow up visit in 42yr, sooner if needed for problems.Marland KitchenMarland Kitchen

## 2014-04-01 DIAGNOSIS — Z23 Encounter for immunization: Secondary | ICD-10-CM | POA: Diagnosis not present

## 2014-05-08 DIAGNOSIS — Z1283 Encounter for screening for malignant neoplasm of skin: Secondary | ICD-10-CM | POA: Diagnosis not present

## 2014-05-08 DIAGNOSIS — L570 Actinic keratosis: Secondary | ICD-10-CM | POA: Diagnosis not present

## 2014-05-08 DIAGNOSIS — X32XXXD Exposure to sunlight, subsequent encounter: Secondary | ICD-10-CM | POA: Diagnosis not present

## 2014-06-19 HISTORY — PX: OTHER SURGICAL HISTORY: SHX169

## 2014-11-24 DIAGNOSIS — L570 Actinic keratosis: Secondary | ICD-10-CM | POA: Diagnosis not present

## 2014-11-24 DIAGNOSIS — C4362 Malignant melanoma of left upper limb, including shoulder: Secondary | ICD-10-CM | POA: Diagnosis not present

## 2014-11-24 DIAGNOSIS — Z1283 Encounter for screening for malignant neoplasm of skin: Secondary | ICD-10-CM | POA: Diagnosis not present

## 2014-11-24 DIAGNOSIS — C4359 Malignant melanoma of other part of trunk: Secondary | ICD-10-CM | POA: Diagnosis not present

## 2014-11-24 DIAGNOSIS — D225 Melanocytic nevi of trunk: Secondary | ICD-10-CM | POA: Diagnosis not present

## 2014-11-24 DIAGNOSIS — X32XXXD Exposure to sunlight, subsequent encounter: Secondary | ICD-10-CM | POA: Diagnosis not present

## 2014-12-01 DIAGNOSIS — C4359 Malignant melanoma of other part of trunk: Secondary | ICD-10-CM | POA: Diagnosis not present

## 2014-12-09 DIAGNOSIS — C4362 Malignant melanoma of left upper limb, including shoulder: Secondary | ICD-10-CM | POA: Diagnosis not present

## 2014-12-14 ENCOUNTER — Other Ambulatory Visit: Payer: Self-pay

## 2014-12-16 DIAGNOSIS — C4362 Malignant melanoma of left upper limb, including shoulder: Secondary | ICD-10-CM | POA: Diagnosis not present

## 2015-01-05 DIAGNOSIS — L905 Scar conditions and fibrosis of skin: Secondary | ICD-10-CM | POA: Diagnosis not present

## 2015-01-05 DIAGNOSIS — C4362 Malignant melanoma of left upper limb, including shoulder: Secondary | ICD-10-CM | POA: Diagnosis not present

## 2015-01-05 DIAGNOSIS — F17290 Nicotine dependence, other tobacco product, uncomplicated: Secondary | ICD-10-CM | POA: Diagnosis not present

## 2015-01-05 DIAGNOSIS — C439 Malignant melanoma of skin, unspecified: Secondary | ICD-10-CM | POA: Diagnosis not present

## 2015-01-13 DIAGNOSIS — C4362 Malignant melanoma of left upper limb, including shoulder: Secondary | ICD-10-CM | POA: Diagnosis not present

## 2015-01-27 DIAGNOSIS — C4362 Malignant melanoma of left upper limb, including shoulder: Secondary | ICD-10-CM | POA: Diagnosis not present

## 2015-03-08 IMAGING — CR DG CHEST 2V
2 series · 2 of 2 positions shown · non-contrast
Comparison: 03/19/2012

CLINICAL DATA: Tobacco use disorder

EXAM:
CHEST  2 VIEW

[view not recorded (1 of 2)]
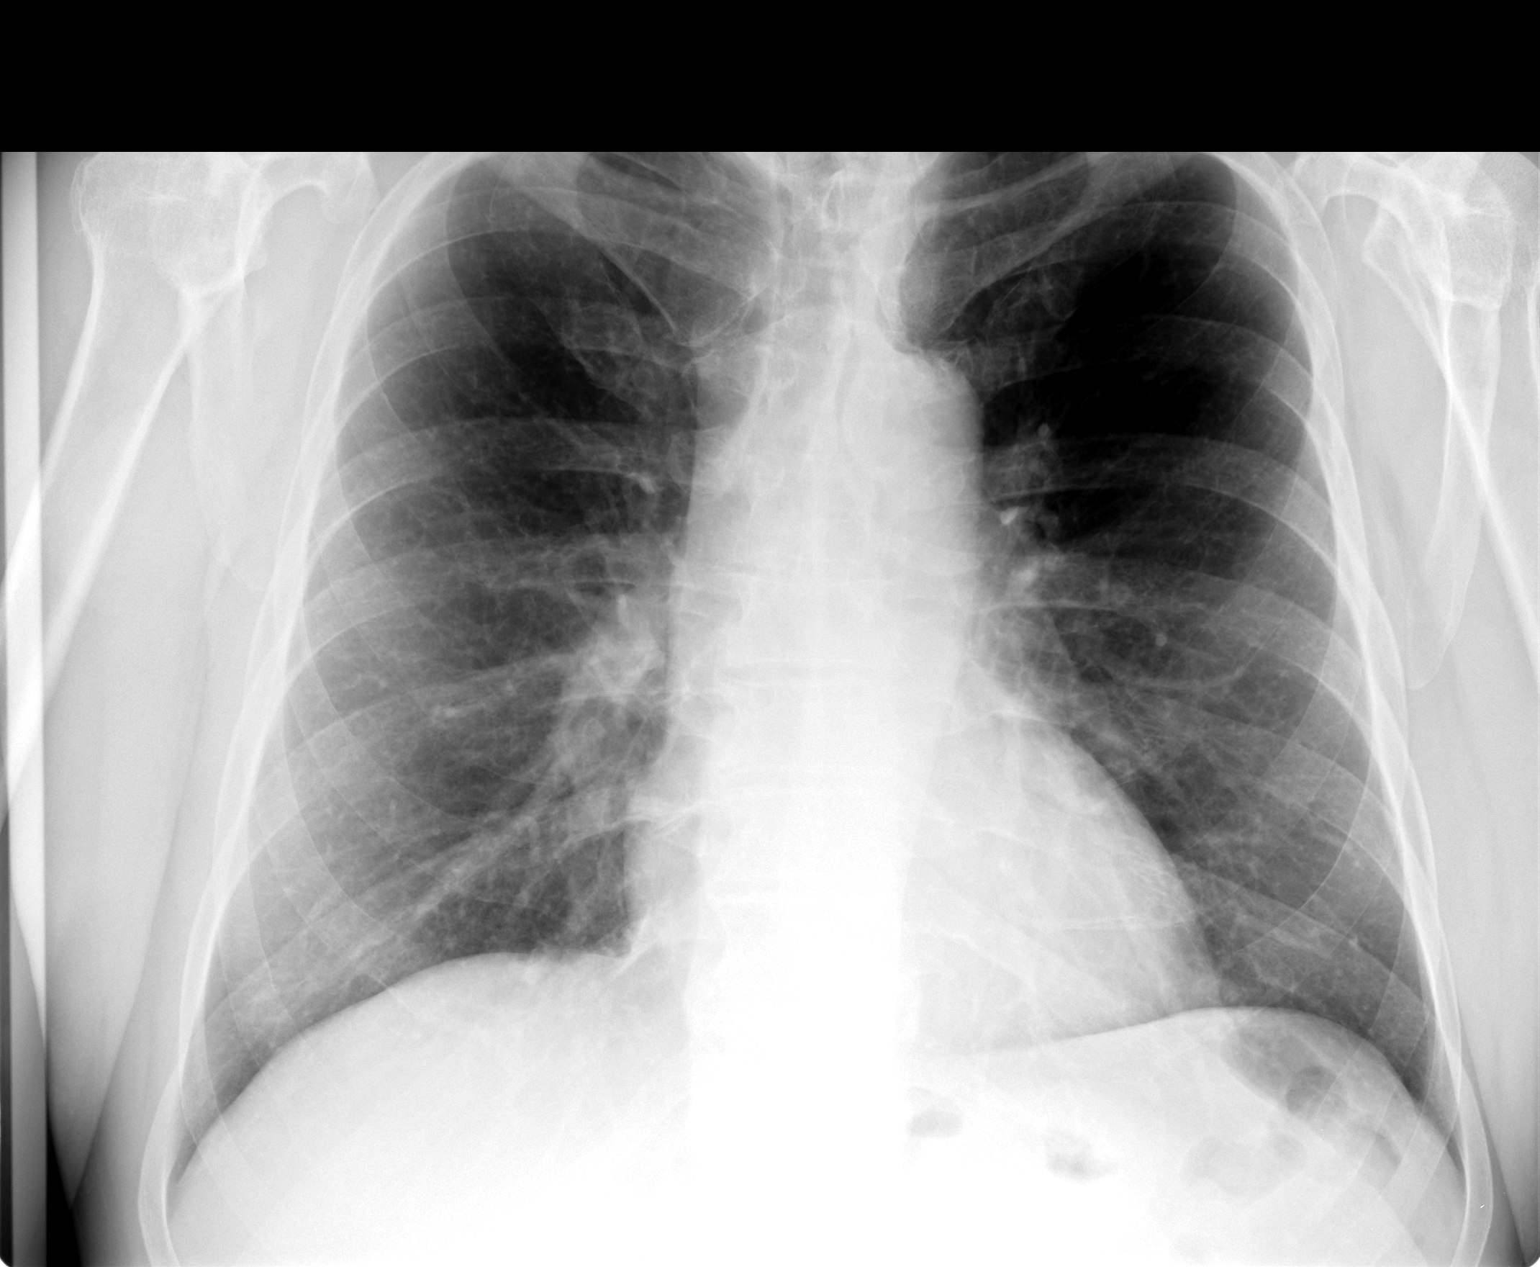

[view not recorded (2 of 2)]
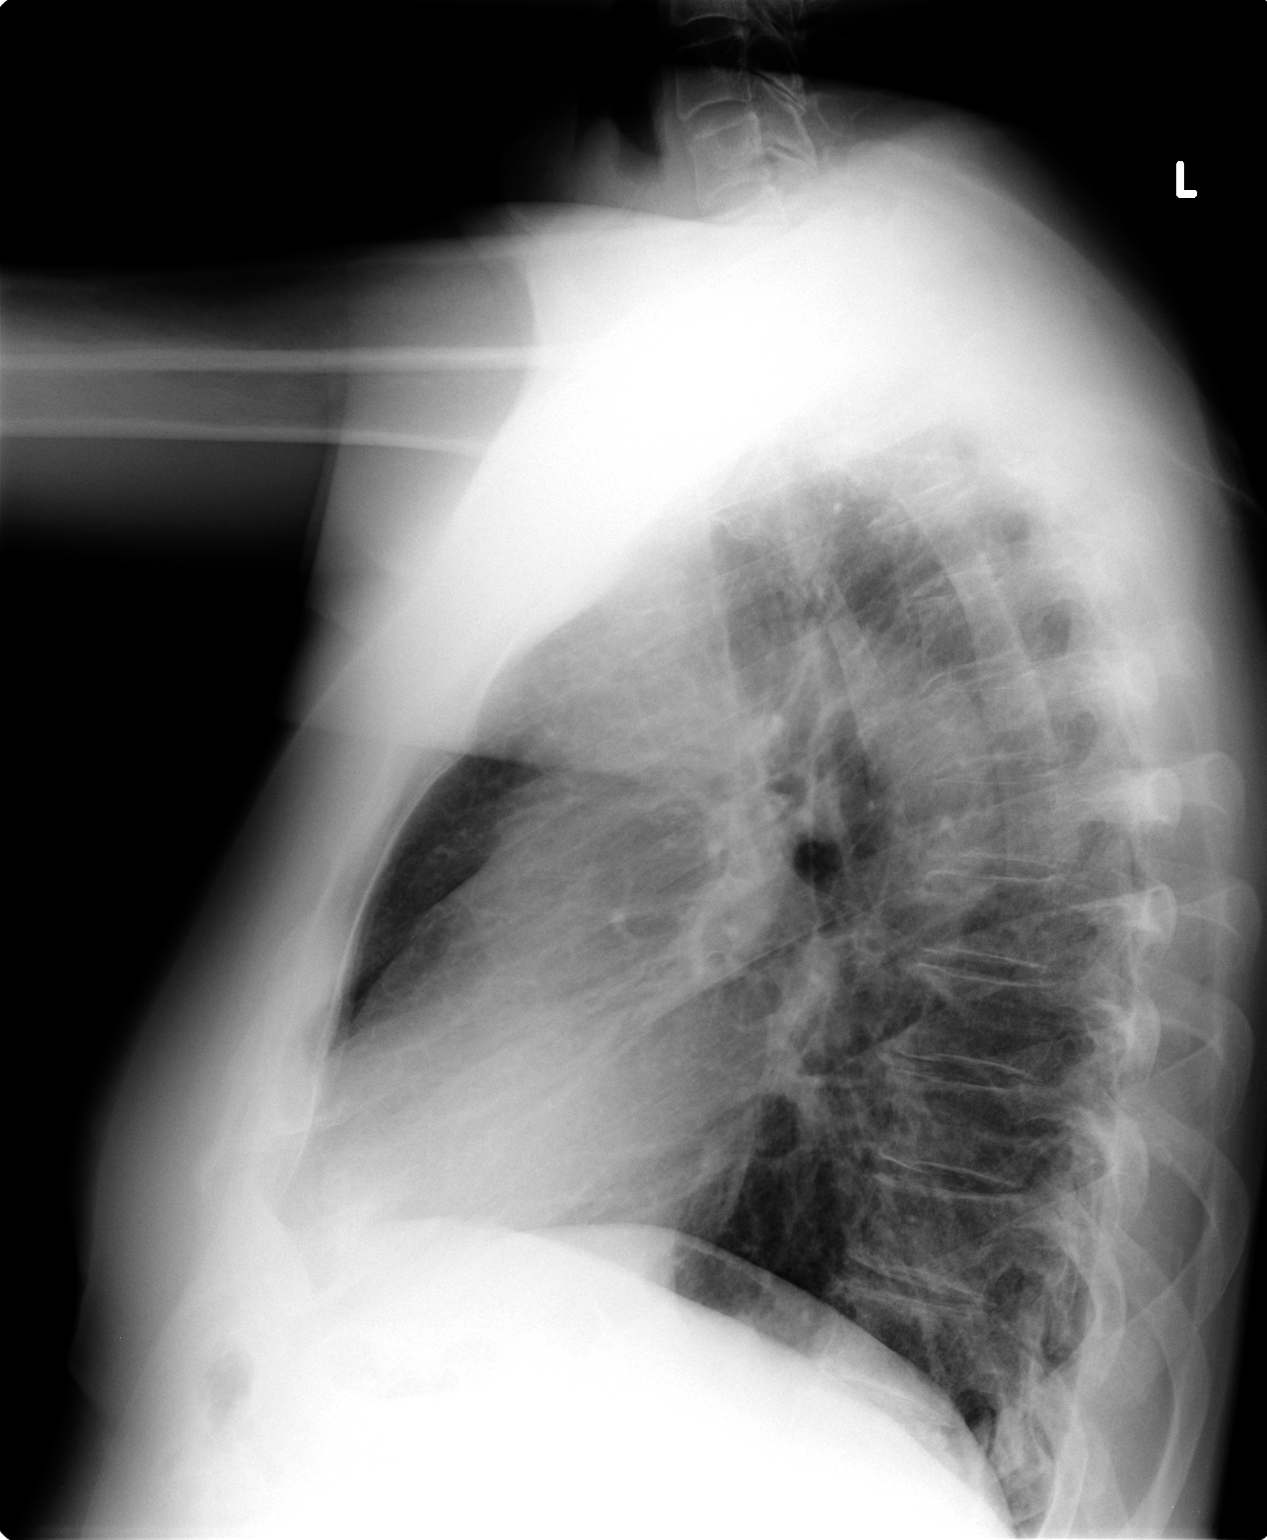

[2 of 2 positions shown; findings below may reference images not displayed]

FINDINGS: Heart size and vascularity are normal. Lungs are clear without
infiltrate or effusion. Negative for mass or adenopathy. 2 mm
granuloma left lower lobe unchanged.
IMPRESSION: No active cardiopulmonary disease.

## 2015-03-12 DIAGNOSIS — Z85820 Personal history of malignant melanoma of skin: Secondary | ICD-10-CM | POA: Diagnosis not present

## 2015-03-12 DIAGNOSIS — Z08 Encounter for follow-up examination after completed treatment for malignant neoplasm: Secondary | ICD-10-CM | POA: Diagnosis not present

## 2015-03-12 DIAGNOSIS — D0471 Carcinoma in situ of skin of right lower limb, including hip: Secondary | ICD-10-CM | POA: Diagnosis not present

## 2015-03-24 ENCOUNTER — Ambulatory Visit
Admission: RE | Admit: 2015-03-24 | Discharge: 2015-03-24 | Disposition: A | Discharge status: Home / Self Care | Payer: Medicare Other | Source: Ambulatory Visit | Attending: Pulmonary Disease | Admitting: Pulmonary Disease

## 2015-03-24 ENCOUNTER — Other Ambulatory Visit (INDEPENDENT_AMBULATORY_CARE_PROVIDER_SITE_OTHER): Payer: Medicare Other

## 2015-03-24 ENCOUNTER — Ambulatory Visit (INDEPENDENT_AMBULATORY_CARE_PROVIDER_SITE_OTHER): Payer: Medicare Other | Admitting: Pulmonary Disease

## 2015-03-24 ENCOUNTER — Encounter: Payer: Self-pay | Admitting: Pulmonary Disease

## 2015-03-24 VITALS — BP 118/72 | HR 52 | Temp 97.6°F | Wt 190.0 lb

## 2015-03-24 DIAGNOSIS — D126 Benign neoplasm of colon, unspecified: Secondary | ICD-10-CM

## 2015-03-24 DIAGNOSIS — N320 Bladder-neck obstruction: Secondary | ICD-10-CM

## 2015-03-24 DIAGNOSIS — Z0000 Encounter for general adult medical examination without abnormal findings: Secondary | ICD-10-CM

## 2015-03-24 DIAGNOSIS — E7800 Pure hypercholesterolemia, unspecified: Secondary | ICD-10-CM

## 2015-03-24 DIAGNOSIS — K5730 Diverticulosis of large intestine without perforation or abscess without bleeding: Secondary | ICD-10-CM

## 2015-03-24 DIAGNOSIS — F17200 Nicotine dependence, unspecified, uncomplicated: Secondary | ICD-10-CM

## 2015-03-24 DIAGNOSIS — F411 Generalized anxiety disorder: Secondary | ICD-10-CM

## 2015-03-24 DIAGNOSIS — M1712 Unilateral primary osteoarthritis, left knee: Secondary | ICD-10-CM

## 2015-03-24 DIAGNOSIS — Z8520 Personal history of malignant neoplasm of unspecified respiratory organ: Secondary | ICD-10-CM | POA: Diagnosis not present

## 2015-03-24 DIAGNOSIS — Z23 Encounter for immunization: Secondary | ICD-10-CM | POA: Diagnosis not present

## 2015-03-24 LAB — BASIC METABOLIC PANEL
BUN: 15 mg/dL (ref 6–23)
CHLORIDE: 106 meq/L (ref 96–112)
CO2: 28 mEq/L (ref 19–32)
Calcium: 9.6 mg/dL (ref 8.4–10.5)
Creatinine, Ser: 0.81 mg/dL (ref 0.40–1.50)
GFR: 100.99 mL/min (ref >60.00)
Glucose, Bld: 99 mg/dL (ref 70–99)
Potassium: 4.6 mEq/L (ref 3.5–5.1)
Sodium: 141 mEq/L (ref 135–145)

## 2015-03-24 LAB — CBC WITH DIFFERENTIAL/PLATELET
BASOS PCT: 0.4 % (ref 0.0–3.0)
Basophils Absolute: 0 10*3/uL (ref 0.0–0.1)
EOS ABS: 0.2 10*3/uL (ref 0.0–0.7)
EOS PCT: 2.6 % (ref 0.0–5.0)
HEMATOCRIT: 46.7 % (ref 39.0–52.0)
Hemoglobin: 15.9 g/dL (ref 13.0–17.0)
Lymphocytes Relative: 23.6 % (ref 12.0–46.0)
Lymphs Abs: 2.1 10*3/uL (ref 0.7–4.0)
MCHC: 34 g/dL (ref 30.0–36.0)
MCV: 95.6 fl (ref 78.0–100.0)
MONO ABS: 0.6 10*3/uL (ref 0.1–1.0)
Monocytes Relative: 6.9 % (ref 3.0–12.0)
NEUTROS ABS: 6 10*3/uL (ref 1.4–7.7)
Neutrophils Relative %: 66.5 % (ref 43.0–77.0)
PLATELETS: 209 10*3/uL (ref 150.0–400.0)
RBC: 4.89 Mil/uL (ref 4.22–5.81)
RDW: 13.3 % (ref 11.5–15.5)
WBC: 9.1 10*3/uL (ref 4.0–10.5)

## 2015-03-24 LAB — LIPID PANEL
CHOL/HDL RATIO: 2
Cholesterol: 159 mg/dL (ref 0–200)
HDL: 64.7 mg/dL (ref >39.00)
LDL CALC: 84 mg/dL (ref 0–99)
NonHDL: 94.51
Triglycerides: 54 mg/dL (ref 0.0–149.0)
VLDL: 10.8 mg/dL (ref 0.0–40.0)

## 2015-03-24 LAB — HEPATIC FUNCTION PANEL
ALT: 24 U/L (ref 0–53)
AST: 21 U/L (ref 0–37)
Albumin: 4.2 g/dL (ref 3.5–5.2)
Alkaline Phosphatase: 81 U/L (ref 39–117)
BILIRUBIN TOTAL: 0.9 mg/dL (ref 0.2–1.2)
Bilirubin, Direct: 0.2 mg/dL (ref 0.0–0.3)
Total Protein: 6.8 g/dL (ref 6.0–8.3)

## 2015-03-24 LAB — PSA: PSA: 1 ng/mL (ref 0.10–4.00)

## 2015-03-24 LAB — TSH: TSH: 1.1 u[IU]/mL (ref 0.35–4.50)

## 2015-03-24 NOTE — Progress Notes (Signed)
Subjective:     Patient ID: Russell [name], male   DOB: May 12, 1946, 69 y.o.   MRN: [id number]  HPI 69 y/o WM here for a follow up visit...   ~  August 22, 2010:  Yearly follow up- doing well overall notes some left knee pain (he works out in gym 5d/wk +golf etc & uses Advil Prn), occas nasal drainage & epistaxis (wife made appt w/ ENT later this week- we discussed saline)...    On Simva40 for Chol & FLP 3/12 shows TChol 157, TG 54, HDL 54, LDL 92> continue same +diet etc...  EKG= SBrady 54/min, WNL.Marland KitchenMarland Kitchen CPX labs all look good (he didn't go for his XRay today)...  ~  March 19, 2012:  19 month ROV & CPX> Russell [name] has had a good interval except for the right kidney stone 7-8/12, treated via ER & was able to pass, then seen by Urology (per pt hx we do not have notes & nothing in Epic), sounds like calc oxalate stones w/ rec for dietary adjust- no colas, tea, etc & increase citrate; he has been doing well since then & no known recurrence...    His only meds are ASA81 and Simva40> tol well & FLP today is good- all parameters at goals, continue same...  His BP is sl elev today at 160/88 but he's had a stressful morn, & BP's at his last two blood donor appts= 110-120/ 60-70's;  He exercises regularly w/o difficulty...    We reviewed prob list, meds, xrays and labs> see below for updates >> he'll get flu vaccine next week...  CXR 10/13 showed normal heart size, clear lungs, tiny granuloma left base, NAD...  LABS 10/13:  FLP- at goals on Simva40;  Chems- wnl;  CBC- wnl;  TSH=0.83;  PSA= 1.23;  Urine- clear  ~  March 19, 2013:  Yearly ROV & Russell [name] is doing well x for some left knee pain & he has appt w/ Ortho next week; a business assoc recently passed from metastatic colon ca; he exercises regularly- 5d per week at sport time... We reviewed the following medical problems during today's office visit >>     Smoker & Abn CXR> he smokes cigars on the golf course & has a sm LLL granuloma on CXR; he remains  asymptomatic w/o cough, sput, SOB, CP, etc...    CHOL> on Simva40; prev LDL as high as 150 range on diet alone; FLP 10/14 shows TChol 149, TG 28, HDL 62, LDL 81    GI- divertics, colon polyps & hems> grandfather had colon cancer; pt noted rectal bleeding 5/14 & saw DrPerry in f/u> colonoscopy done 6/14 w/ divertics, hems, & 4 polyps- tubulovillous adenomas & f/u planned 3-24yrs    GU- hx prostatitis & kid stones> had stone 2012 able to pass it; Urology eval by DrOttelin & diet adjust; no recurrent problem since then...    DJD> he has seen DrYates in the past & had left knee arthroscopy; says he was told bone on bone & would need replacement; he takes 3 Advil prior to golfing...    Hx skin cancer> his friend and business partner died from metastatic melanoma; pt has DrHall check his skin every 36mo...    Blood donor> his has donated over 150 pts he says; continues to donate regularly; labs 10/14 showed Hg= 15.3 We reviewed prob list, meds, xrays and labs> see below for updates >>   CXR 10/14 showed normal heart size, clear lungs, 30mm granuloma LLL, NAD.Marland KitchenMarland Kitchen  EKG-  SBrady, rate54, wnl, NAD...  LABS 10/14:  FLP- at goals on Simva40;  Chems- wnl;  CBC- wnl;  TSH=1.14;  PSA=1.43...   ~  March 19, 2014:  Yearly ROV & review of medical problems> Russell [name] had his left TKR 11/14 by DrYates then had complic w/ patella dislocation requiring a se4cond surg by DrDuda 2 wks later; this was followed by rehab & now back to normal & he is very pleased- fully recovered, playing golf, etc... 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 We reviewed the following medical problems during today's office visit >>     Smoker & Abn CXR> he smokes cigars on the golf course & has a sm LLL granuloma on CXR; he remains asymptomatic w/o cough, sput, SOB, CP, etc...    CHOL> on Simva40; prev LDL as high as 150 range on diet alone; FLP 10/15 shows TChol 150, TG 53, HDL 56, LDL  83    GI- divertics, colon polyps & hems> grandfather had colon cancer; pt noted rectal bleeding 5/14 & saw DrPerry in f/u> colonoscopy done 6/14 w/ divertics, hems, & 4 polyps- tubulovillous adenomas & f/u planned 3-60yrs    GU- hx prostatitis & kid stones> had stone 2012 able to pass it; Urology eval by DrOttelin & diet adjust; no recurrent problem since then...    DJD> he has seen DrYates in the past & had left knee arthroscopy; finally had left TKR 40/10 w/ complic (patella dislocation) requiring surg 2wks later; s/p rehab & back to normal now...    Hx skin cancer> his friend and business partner died from metastatic melanoma; pt has DrHall check his skin every 97mo...    Blood donor> his has donated over 150 pts he says; continues to donate regularly; labs 10/14 showed Hg= 15.3 We reviewed prob list, meds, xrays and labs> see below for updates >> he will get Flu shot at work in 2 wks...   CXR 10/15 showed norm heart size, mild tortuosity of Ao, clear lungs, NAD...  LABS 10/15:  FLP- at goals on Simva40;  Chems- wnl;  CBC- wnl;  TSH=0.85;  PSA=1.73...  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 otherw feeling well- no new complaints or concerns... We reviewed the following medical problems during today's office visit >>     Smoker & Abn CXR> he smokes cigars on the golf course & has a sm LLL granuloma on CXR; he remains asymptomatic w/o cough, sput, SOB, CP, etc...    CHOL> on Simva40; prev LDL as high as 150 range on diet alone; FLP 10/16 shows TChol 159, TG 54, HDL 65, LDL 84    GI- divertics, colon polyps & hems> grandfather had colon cancer; pt noted rectal bleeding 5/14 & saw DrPerry in f/u> colonoscopy done 6/14 w/ divertics, hems, & 4 polyps-  tubulovillous adenomas & f/u planned 3-76yrs    GU- hx prostatitis & kid stones> had stone 2012 able to pass it; Urology eval by DrOttelin & diet adjust; no recurrent problem since then...    DJD> he has seen DrYates in the past & had left knee arthroscopy; finally had left TKR 27/25 w/ complic (patella dislocation) requiring surg 2wks later; s/p rehab &  back to normal now...    Hx skin cancer/ MELANOMA> his friend and business partner died from metastatic melanoma; pt has DrHall check his skin every 76mo=> left post shoulder melanoma found 12/2014 w/ surg at [facility] for wide excision & sentinel node=> all neg.    Blood donor> his has donated over 150 pts he says; continues to donate regularly; labs 10/14 showed Hg= 15.3 EXAM shows Afeb, VSS, O2sat=96% on RA;  Wt=190# stable;  HEENT- neg, mallampati2;  Chest- clear w/o w/r/r;  Heart- RR w/o m/r/g;  Abd- soft, nontender, neg;  Ext- neg w/o c/c/e;  Neuro- intact...   CXR 03/24/15 showed norm heart size, clear lungs, NAD.Marland KitchenMarland Kitchen   EKG 03/24/15 showed SBrady, rate50, WNL, NAD...  LABS 03/2015>  FLP at goals on Simva40;  Chems- wnl;  CBC- wnl;  TSH=1.10;  PSA=1.00  IMP/PLANS>>  Russell [name] was given 2016 Flu vaccine & the Prevnar-13 shot today; he will maintain f/u w/ [facility] surgery; continue same meds...          Problem List:   SMOKER (ICD-305.1) - he is a cigar smoker and states that he does not inhale... smokers only occas "when I play golf or go to the bar"... ~  CXR 3/12 showed norm heart size, mild interstitial thickening, 70mm calc granuloma left lung base. ~  CTAbd 7/12 in ER for kidney stone showed incidental mild bullous changes at lung bases & bibasilar atx; also atherosclerotic changes in Ao & branches w/o aneurysm.  ~  CXR 10/13 showed tiny calcif granuloma at left lung base, norm heart size, otherw clear lungs... ~  CXR 10/14 showed normal heart size, clear lungs, 24mm granuloma LLL, NAD ~  CXR 10/15 showed norm heart size, mild tortuosity of Ao,  clear lungs, NAD ~  CXR 10/16 showed norm heart size, clear lungs, NAD...   HYPERCHOLESTEROLEMIA, BORDERLINE (ICD-272.4) - on diet Rx alone & prev not interested in med Rx... now he states he will consider medication if no better on his diet... ~  [place] 11/06 showed TChol 210, Tg 41, HDL 59, LDL 138... pt refers diet Rx. ~  [place] 12/09 showed TChol 219, Tg 43, HDL 53, LDL 141... rec> start Simva but he refuses, prefers diet. ~  FLP 12/10 showed TChol  222, TG 111, HDL 53, LDL 153... rec> start Simva40. ~  [place] 3/11 on Simva40 showed TChol 141, TG 36, HDL 59, LDL 75... continue Simva40. ~  [place] 3/12 on Simva40 showed TChol 157, TG 54, HDL 54, LDL 92 ~  FLP 10/13 on Simva40 showed TChol 163, TG 35, HDL 73, LDL 83 ~  FLP 10/14 on Simva40 showed TChol 149, TG 28, HDL 62, LDL 81  ~  FLP 10/15 on Simva40 showed TChol 150, TG 53, HDL 56, LDL 83 ~  FLP 10/16 on Simva40 showed TChol 159, TG 54, HDL 65, LDL 84  Hx of COLONIC POLYPS (ICD-211.3), & HEMORRHOIDS (ICD-455.6) - + FamHx colon cancer in his grandfather... last colonoscopy 2/08 by DrPerry showed 80mm polyp & hems... path= polypoid mucosa w/o adenomatous change... f/u planned 66yrs. ~  grandfather had colon cancer; pt noted rectal bleeding 5/14 & saw DrPerry in f/u> colonoscopy done 6/14 w/ divertics, hems, & 4 polyps- tubulovillous adenomas & f/u planned 3-83yrs  Hx of ACUTE PROSTATITIS (ICD-601.0) - prev eval & Rx by DrWrenn... no symptoms...  KIDNEY STONES >>  ~  New onset problem 7/12 & went to ER for treatment; Rx conservatively & he was able to  pass stone; followed up w/ Urology, DrOttelin (we don't have notes) & he reports stone was calc oxalate & rec to avoid colas, tea, etc; increase water intake & use citrate when poss... 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 ~  XRay Abd in ER 8/12 showed mod stool burden, mild lumbar spondylosis, NAD... ~  No known recurrence of stone disease to date...  DEGENERATIVE JOINT DISEASE (ICD-715.90) - he had a prev left knee arthroscopy by DrYates in 2008... he take 3 ADVIL prior to golfing... ~  10/14: states he was prev told bone on bone in knee & he'd need TKR per DrYates=> done 01/60 w/ complic (patella dislocation) requiring surg 2wks later; s/p rehab & back to normal now...  SKIN CANCER, HX OF (ICD-V10.83) - he sees DrHall for skin check every 6 months... 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 ~  [id number]:  DrHall found MELANOMA on left post shoulder- referred to Surg On[facility]Ollila [facility] for wider excision, mapping, & sentinel node Bx- margins clear & LN was neg; they continue to follow...  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 ~  labs 12/09 showed Hg= 15.7 ~  labs 12/10 showed Hg= 16.6 ~  Labs 3/12 showed Hg= 16.1 ~  Labs 10/13 showed Hg= 15.0 ~  Labs 10/14 showed Hg= 15.3 ~  Labs 10/15 showed Hg= 15.5 ~  Labs 10/16 showed Hg= 15.9  HEALTH MAINTENANCE:  he takes ASA 81mg  on MWF since he has epistaxis on daily ASA early in 2009... ~  GI:  DrPerry & up to date on screening colonoscopy... ~  GU:  DrWrenn in the past... PSA 10/14 = 1.43 ~  Immuniz:  He is up to date on all indicated vaccinations... Immunization History  Administered Date(s) Administered  . Influenza Whole 02/25/2010, 02/18/2012  . Influenza, High Dose Seasonal PF 04/01/2014  . Influenza,inj,Quad PF,36+ Mos 03/24/2015  . Influenza-Unspecified 02/17/2013  . Pneumococcal Polysaccharide-23 03/19/2013    Past Medical History  Diagnosis Date  . Smoker   . Other and unspecified hyperlipidemia   . Hx of colonic polyps   . Hemorrhoids   . Acute prostatitis   . DJD (degenerative joint disease)   . History of skin cancer   . Whole  blood donor   . Cancer ([place])     basal cell removed  . Kidney stone   . Dislocated patella 04/2013    S/P   ARTHROPLASTY    Past Surgical History  Procedure Laterality Date  . Tonsillectomy and adenoidectomy    . Skin cancer removed    . Left knee arthroscopy  03/2007  . 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 Isaiah Blakes at [facility]  . Eye surgery    . Knee arthroplasty Left 04/30/2013    Procedure: COMPUTER ASSISTED TOTAL KNEE ARTHROPLASTY;  Surgeon: Marybelle Killings, MD;  Location: [place];  Service: Orthopedics;  Laterality: Left;  Left Cemented Total Knee Arthroplasty  . Patellar tendon repair Left 05/14/2013    Procedure: Left Total Knee Arthroplasty Medial Retinaculum Repair;  Surgeon: Marybelle Killings, MD;  Location: [place];  Service: Orthopedics;  Laterality: Left;  Left Total Knee Arthroplasty Medial Retinaculum Repair  Outpatient Encounter Prescriptions as of 03/24/2015  Medication Sig  . aspirin 81 MG tablet 1 tablet by mouth three times per week.  . Multiple Vitamins-Minerals (MULTIVITAMIN ADULTS 50+ PO) Take 1 tablet by mouth daily.  . simvastatin (ZOCOR) 40 MG tablet Take 1 tablet (40 mg total) by mouth every evening.   No facility-administered encounter medications on file as of 03/24/2015.    No Known Allergies   Current Medications, Allergies, Past Medical History, Past Surgical History, Family History, and Social History were reviewed in [employer] record.   Review of Systems         The patient complains of joint pain and arthritis.  The patient denies fever, chills, sweats, anorexia, fatigue, weakness, malaise, weight loss, sleep disorder, blurring, diplopia, eye irritation, eye discharge, vision loss, eye pain, photophobia, earache, ear discharge, tinnitus, decreased hearing, nasal congestion, nosebleeds, sore throat, hoarseness, chest pain, palpitations, syncope, dyspnea on exertion, orthopnea, PND, peripheral edema, cough, dyspnea  at rest, excessive sputum, hemoptysis, wheezing, pleurisy, nausea, vomiting, diarrhea, constipation, change in bowel habits, abdominal pain, melena, hematochezia, jaundice, gas/bloating, indigestion/heartburn, dysphagia, odynophagia, dysuria, hematuria, urinary frequency, urinary hesitancy, nocturia, incontinence, back pain, joint swelling, muscle cramps, muscle weakness, stiffness, sciatica, restless legs, leg pain at night, leg pain with exertion, rash, itching, dryness, suspicious lesions, paralysis, paresthesias, seizures, tremors, vertigo, transient blindness, frequent falls, frequent headaches, difficulty walking, depression, anxiety, memory loss, confusion, cold intolerance, heat intolerance, polydipsia, polyphagia, polyuria, unusual weight change, abnormal bruising, bleeding, enlarged lymph nodes, urticaria, allergic rash, hay fever, and recurrent infections.     Objective:   Physical Exam    WD, WN, 69 y/o WM in NAD...  GENERAL:  Alert & oriented; pleasant & cooperative... HEENT:  [place]/AT, EOM-wnl, PERRLA, EACs-clear, TMs-wnl, NOSE-clear, THROAT-clear & wnl. NECK:  Supple w/ full ROM; no JVD; normal carotid impulses w/o bruits; no thyromegaly or nodules palpated; no lymphadenopathy. CHEST:  Clear to P & A; without wheezes/ rales/ or rhonchi. HEART:  Regular Rhythm; without murmurs/ rubs/ or gallops. ABDOMEN:  Soft & nontender; normal bowel sounds; no organomegaly or masses detected. RECTAL:  Neg - prostate 2+ & nontender w/o nodules; stool hematest neg. EXT: without deformities, mild arthritic changes; no varicose veins/ venous insuffic/ or edema. NEURO:  CN's intact; motor testing normal; sensory testing normal; gait normal & balance OK. DERM:  No lesions noted; no rash etc...  RADIOLOGY DATA:  Reviewed in the EPIC EMR & discussed w/ the patient...  LABORATORY DATA:  Reviewed in the EPIC EMR & discussed w/ the patient...   Assessment:      Cigar SMOKER>  CXR is stable, NAD; 89mm  calcif granuloma noted in LLL; CTAbd in 2012 showed incidental bullous changes at lung bases; must quit all smoking...  CHOL>  FLP looks good on Simva40; continue same + diet...  Hx Colon Polyps>  followed by DrPerry & f/u colon done 6/14 by DrPerry- 4 sm polyps removed & 3 were tubulovillous adenomas- f/u colon planned 65yrs per GI...  Hx Kid Stones & Prostatitis in past>  No recurrent kid stones or prostate symptoms; PSA= 1.43  DJD>  Followed by Avel Peace; s/p left TKR & doing well...  Skin Ca>  Followed by Natale Milch & pt reports doing OK...  ~  [id number]: Dx w/ melanoma left post shoulder area- removed by [facility], referred to Puckett at [facility] for wide excision, mapping, & sentine node bx=> all neg & they continue to follow.  Whole Blood Donor>  A+ blood type &  has given ~150 pints to date...     Plan:     Patient's Medications  New Prescriptions   No medications on file  Previous Medications   ASPIRIN 81 MG TABLET    1 tablet by mouth three times per week.   MULTIPLE VITAMINS-MINERALS (MULTIVITAMIN ADULTS 50+ PO)    Take 1 tablet by mouth daily.   SIMVASTATIN (ZOCOR) 40 MG TABLET    Take 1 tablet (40 mg total) by mouth every evening.  Modified Medications   No medications on file  Discontinued Medications   No medications on file

## 2015-03-24 NOTE — Patient Instructions (Signed)
Today we updated your med list in our EPIC system...    Continue your current medications the same...  Today we checked your follow up CXR, EKG, and FASTING blood work...    We will contact you w/ the results when available...   .keep up the good work w/ diet & exercise...    You have my permission to quit the cigars at any time!!!  Call for any questions...  Let's plan a follow up visit in 39yr, sooner if needed for problems.Marland KitchenMarland Kitchen

## 2015-03-26 ENCOUNTER — Other Ambulatory Visit: Payer: Self-pay | Admitting: Pulmonary Disease

## 2015-03-26 MED ORDER — SIMVASTATIN 40 MG PO TABS: 40.0000 mg | ORAL_TABLET | Freq: Every evening | ORAL | Status: DC

## 2015-03-26 NOTE — Progress Notes (Signed)
Quick Note:  Called spoke with patient, advised of cxr results / recs as stated by SN. Pt verbalized his understanding and denied any questions. ______ 

## 2015-03-26 NOTE — Progress Notes (Signed)
Quick Note:  Called spoke with patient, advised of lab results / recs as stated by SN. Pt verbalized his understanding and denied any questions. Pt is requesting a refill on his Simvastatin 40mg  qd #90. Orders only encounter created for rx. ______

## 2015-03-26 NOTE — Progress Notes (Signed)
Quick Note:  Called spoke with patient, advised of EKG results / recs as stated by SN. Pt verbalized his understanding and denied any questions. ______

## 2015-04-02 DIAGNOSIS — Z85820 Personal history of malignant melanoma of skin: Secondary | ICD-10-CM | POA: Diagnosis not present

## 2015-04-02 DIAGNOSIS — D0471 Carcinoma in situ of skin of right lower limb, including hip: Secondary | ICD-10-CM | POA: Diagnosis not present

## 2015-04-02 DIAGNOSIS — L570 Actinic keratosis: Secondary | ICD-10-CM | POA: Diagnosis not present

## 2015-04-02 DIAGNOSIS — Z1283 Encounter for screening for malignant neoplasm of skin: Secondary | ICD-10-CM | POA: Diagnosis not present

## 2015-04-02 DIAGNOSIS — Z08 Encounter for follow-up examination after completed treatment for malignant neoplasm: Secondary | ICD-10-CM | POA: Diagnosis not present

## 2015-04-02 DIAGNOSIS — X32XXXD Exposure to sunlight, subsequent encounter: Secondary | ICD-10-CM | POA: Diagnosis not present

## 2015-05-19 DIAGNOSIS — L439 Lichen planus, unspecified: Secondary | ICD-10-CM | POA: Diagnosis not present

## 2015-05-19 DIAGNOSIS — D0471 Carcinoma in situ of skin of right lower limb, including hip: Secondary | ICD-10-CM | POA: Diagnosis not present

## 2015-05-19 DIAGNOSIS — Z85828 Personal history of other malignant neoplasm of skin: Secondary | ICD-10-CM | POA: Diagnosis not present

## 2015-05-19 DIAGNOSIS — L570 Actinic keratosis: Secondary | ICD-10-CM | POA: Diagnosis not present

## 2015-05-19 DIAGNOSIS — Z08 Encounter for follow-up examination after completed treatment for malignant neoplasm: Secondary | ICD-10-CM | POA: Diagnosis not present

## 2015-05-19 DIAGNOSIS — X32XXXD Exposure to sunlight, subsequent encounter: Secondary | ICD-10-CM | POA: Diagnosis not present

## 2015-07-15 ENCOUNTER — Telehealth: Payer: Self-pay | Admitting: Pulmonary Disease

## 2015-07-15 MED ORDER — AMOXICILLIN-POT CLAVULANATE 875-125 MG PO TABS: 1.0000 | ORAL_TABLET | Freq: Two times a day (BID) | ORAL | Status: DC

## 2015-07-15 NOTE — Telephone Encounter (Signed)
Spoke with pt. Reports sinus pressure, headache and sore throat. Denies coughing, wheezing, SOB, chest tightness or fever. States that his symptoms started 2 days ago. Has tried Claritin and Tylneol to help with symptoms with no relief. Would like an antibiotic sent in.  No Known Allergies  SN - please advise. Thanks.

## 2015-07-15 NOTE — Telephone Encounter (Signed)
Per SN: Call in Augmentin 875mg  1 PO BID, #14; Take OTC Align, QD while taking antibiotic.  Take Mucinex 1200mg  PO BID and fluids, and use nasal saline mist. Called and advised patient of Dr. Jeannine Kitten recommendations.   Rx sent to pharmacy. Patient aware. Nothing further needed.

## 2015-08-10 DIAGNOSIS — C4362 Malignant melanoma of left upper limb, including shoulder: Secondary | ICD-10-CM | POA: Diagnosis not present

## 2015-10-13 DIAGNOSIS — L570 Actinic keratosis: Secondary | ICD-10-CM | POA: Diagnosis not present

## 2015-10-13 DIAGNOSIS — X32XXXD Exposure to sunlight, subsequent encounter: Secondary | ICD-10-CM | POA: Diagnosis not present

## 2015-10-13 DIAGNOSIS — Z1283 Encounter for screening for malignant neoplasm of skin: Secondary | ICD-10-CM | POA: Diagnosis not present

## 2015-10-13 DIAGNOSIS — Z85820 Personal history of malignant melanoma of skin: Secondary | ICD-10-CM | POA: Diagnosis not present

## 2015-10-13 DIAGNOSIS — Z08 Encounter for follow-up examination after completed treatment for malignant neoplasm: Secondary | ICD-10-CM | POA: Diagnosis not present

## 2015-11-29 ENCOUNTER — Encounter: Payer: Self-pay | Admitting: Internal Medicine

## 2015-11-30 DIAGNOSIS — B9689 Other specified bacterial agents as the cause of diseases classified elsewhere: Secondary | ICD-10-CM | POA: Diagnosis not present

## 2015-11-30 DIAGNOSIS — L820 Inflamed seborrheic keratosis: Secondary | ICD-10-CM | POA: Diagnosis not present

## 2015-11-30 DIAGNOSIS — L0202 Furuncle of face: Secondary | ICD-10-CM | POA: Diagnosis not present

## 2015-12-01 ENCOUNTER — Encounter: Payer: Self-pay | Admitting: Internal Medicine

## 2015-12-06 ENCOUNTER — Telehealth: Payer: Self-pay | Admitting: Pulmonary Disease

## 2015-12-06 DIAGNOSIS — J8410 Pulmonary fibrosis, unspecified: Secondary | ICD-10-CM

## 2015-12-06 MED ORDER — METHYLPREDNISOLONE 4 MG PO TBPK: ORAL_TABLET | ORAL | Status: DC

## 2015-12-06 NOTE — Telephone Encounter (Signed)
Pt states that he noticed last week 11/30/15 his joints were very stiff and sore - shoulders, neck, arms and knees. Pt states that he has not done any strenuous activities that could have caused this pain, he just woke up hurting. Denies fever and headaches. Pt states that today joints are still stiff and sore but have improved some- right knee and neck is the worst and still present. Taking Advil for the discomfort. Pt states that he has not seen a tick anywhere on his body or any bite marks.  Denies starting any new meds in the last month or two.  Please advise Dr Lenna Gilford. Thanks.     Medication List       This list is accurate as of: 12/06/15  9:44 AM.  Always use your most recent med list.               amoxicillin-clavulanate 875-125 MG tablet  Commonly known as:  AUGMENTIN  Take 1 tablet by mouth 2 (two) times daily.     aspirin 81 MG tablet  1 tablet by mouth three times per week.     MULTIVITAMIN ADULTS 50+ PO  Take 1 tablet by mouth daily.     simvastatin 40 MG tablet  Commonly known as:  ZOCOR  Take 1 tablet (40 mg total) by mouth every evening.       No Known Allergies

## 2015-12-06 NOTE — Telephone Encounter (Signed)
Per Dr. Lenna Gilford:  Lets do some labs: CBC w/ Diff; BMET; Sed Rate; RA Factor; ANA; Anti CCP; Collagen Screen Then, after labs have been drawn, start Medrol Dose Pak #1; if not better will need ROV ------------------------------- Patient is aware of Dr. Jeannine Kitten recommendations. Patient states he will come by the lab in the morning, fasting, then will start Medrol pack after lab draw. Nothing further needed.

## 2015-12-07 ENCOUNTER — Other Ambulatory Visit (INDEPENDENT_AMBULATORY_CARE_PROVIDER_SITE_OTHER): Payer: Medicare Other

## 2015-12-07 DIAGNOSIS — J8410 Pulmonary fibrosis, unspecified: Secondary | ICD-10-CM | POA: Diagnosis not present

## 2015-12-07 LAB — CBC WITH DIFFERENTIAL/PLATELET
BASOS ABS: 0 10*3/uL (ref 0.0–0.1)
Basophils Relative: 0.3 % (ref 0.0–3.0)
EOS ABS: 0.5 10*3/uL (ref 0.0–0.7)
EOS PCT: 5.8 % — AB (ref 0.0–5.0)
HCT: 45 % (ref 39.0–52.0)
HEMOGLOBIN: 15.3 g/dL (ref 13.0–17.0)
Lymphocytes Relative: 16.4 % (ref 12.0–46.0)
Lymphs Abs: 1.5 10*3/uL (ref 0.7–4.0)
MCHC: 34.1 g/dL (ref 30.0–36.0)
MCV: 94.7 fl (ref 78.0–100.0)
MONO ABS: 0.5 10*3/uL (ref 0.1–1.0)
Monocytes Relative: 5.8 % (ref 3.0–12.0)
Neutro Abs: 6.5 10*3/uL (ref 1.4–7.7)
Neutrophils Relative %: 71.7 % (ref 43.0–77.0)
Platelets: 253 10*3/uL (ref 150.0–400.0)
RBC: 4.75 Mil/uL (ref 4.22–5.81)
RDW: 13 % (ref 11.5–15.5)
WBC: 9 10*3/uL (ref 4.0–10.5)

## 2015-12-07 LAB — BASIC METABOLIC PANEL
BUN: 21 mg/dL (ref 6–23)
CHLORIDE: 109 meq/L (ref 96–112)
CO2: 26 meq/L (ref 19–32)
Calcium: 9.5 mg/dL (ref 8.4–10.5)
Creatinine, Ser: 0.8 mg/dL (ref 0.40–1.50)
GFR: 102.23 mL/min (ref >60.00)
GLUCOSE: 111 mg/dL — AB (ref 70–99)
POTASSIUM: 4.3 meq/L (ref 3.5–5.1)
SODIUM: 143 meq/L (ref 135–145)

## 2015-12-07 LAB — SEDIMENTATION RATE: Sed Rate: 9 mm/hr (ref 0–20)

## 2015-12-07 LAB — RHEUMATOID FACTOR: Rhuematoid fact SerPl-aCnc: <10 IU/mL (ref <=14)

## 2015-12-08 ENCOUNTER — Telehealth: Payer: Self-pay | Admitting: Pulmonary Disease

## 2015-12-08 DIAGNOSIS — R768 Other specified abnormal immunological findings in serum: Secondary | ICD-10-CM

## 2015-12-08 LAB — ANA: ANA: POSITIVE — AB

## 2015-12-08 LAB — ANTI-NUCLEAR AB-TITER (ANA TITER): ANA Titer 1: 1:80 {titer} — ABNORMAL HIGH

## 2015-12-08 LAB — CYCLIC CITRUL PEPTIDE ANTIBODY, IGG

## 2015-12-08 NOTE — Telephone Encounter (Signed)
Spoke with pt, requesting results of labs from yesterday. SN please advise.  Thanks!

## 2015-12-09 NOTE — Telephone Encounter (Signed)
Patient aware of Dr. Jeannine Kitten recommendations. Patient referred to Rheumatology. Nothing further needed.

## 2015-12-09 NOTE — Telephone Encounter (Signed)
Notes Recorded by Noralee Space, MD on 12/09/2015 at 8:32 AM Please notify patient>  LABS showed essentially normal Chems and CBC... But the collagen vasc screen showed a mixed picture w/ POS ANA (could indicate an autoimmune arthritic condition- eg r/o lupus), but NEG results for RA, & Sed rate (inflammation blood test)... REC> Refer to Rheumatology for their consultation... ---------------------------------------------------------------------------- Left message for patient to call back.

## 2016-01-14 DIAGNOSIS — M2550 Pain in unspecified joint: Secondary | ICD-10-CM | POA: Diagnosis not present

## 2016-01-18 ENCOUNTER — Ambulatory Visit (AMBULATORY_SURGERY_CENTER): Payer: Self-pay | Admitting: *Deleted

## 2016-01-18 VITALS — Ht 72.0 in | Wt 191.6 lb

## 2016-01-18 DIAGNOSIS — Z86010 Personal history of colonic polyps: Secondary | ICD-10-CM

## 2016-01-18 MED ORDER — SUPREP BOWEL PREP KIT 17.5-3.13-1.6 GM/177ML PO SOLN: 1.0000 | Freq: Once | ORAL | 0 refills | Status: AC

## 2016-01-18 NOTE — Progress Notes (Signed)
Patient denies any allergies to egg or soy products. Patient denies complications with anesthesia/sedation.  Patient denies oxygen use at home and denies diet medications. Emmi instructions for colonoscopy explained but patient denied.  Pamphlet denied.

## 2016-01-28 DIAGNOSIS — C4442 Squamous cell carcinoma of skin of scalp and neck: Secondary | ICD-10-CM | POA: Diagnosis not present

## 2016-02-01 ENCOUNTER — Ambulatory Visit (AMBULATORY_SURGERY_CENTER): Payer: Medicare Other | Admitting: Internal Medicine

## 2016-02-01 ENCOUNTER — Encounter: Payer: Self-pay | Admitting: Internal Medicine

## 2016-02-01 VITALS — BP 121/67 | HR 48 | Temp 97.3°F | Resp 14 | Ht 72.0 in | Wt 191.0 lb

## 2016-02-01 DIAGNOSIS — Z1211 Encounter for screening for malignant neoplasm of colon: Secondary | ICD-10-CM | POA: Diagnosis not present

## 2016-02-01 DIAGNOSIS — Z86010 Personal history of colonic polyps: Secondary | ICD-10-CM | POA: Diagnosis not present

## 2016-02-01 MED ORDER — SODIUM CHLORIDE 0.9 % IV SOLN: 500.0000 mL | INTRAVENOUS | Status: DC

## 2016-02-01 NOTE — Progress Notes (Signed)
Patient awakening,vss,report to rn 

## 2016-02-01 NOTE — Op Note (Signed)
[facility] Patient Name: Russell [name] Procedure Date: 02/01/2016 8:48 AM MRN: [id number] Endoscopist: Docia Chuck. Russell [name] , MD Age: 70 Referring MD:  Date of Birth: February 16, 1946 Gender: Male Account #: [id number] Procedure:                Colonoscopy Indications:              Surveillance: Personal history of colonic polyps                            (unknown histology) on last colonoscopy 3 years                            ago, High risk colon cancer surveillance: Personal                            history of adenoma with villous component, High                            risk colon cancer surveillance: Personal history of                            multiple (3 or more) adenomas. Previous                            examinations 2002 in 2008 without adenomatous. 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 The patient's tolerance of                            previous anesthesia was also reviewed. The risks                            and benefits of the procedure and the sedation                            options and risks were discussed with the patient.                            All questions were answered, and informed consent  was obtained. Prior Anticoagulants: The patient has                            taken no previous anticoagulant or antiplatelet                            agents. ASA Grade Assessment: II - A patient with                            mild systemic disease. After reviewing the risks                            and benefits, the patient was deemed in                            satisfactory condition to  undergo the procedure.                           After obtaining informed consent, the colonoscope                            was passed under direct vision. Throughout the                            procedure, the patient's blood pressure, pulse, and                            oxygen saturations were monitored continuously. The                            Model CF-HQ190L [id number]) scope was introduced                            through the anus and advanced to the the cecum,                            identified by appendiceal orifice and ileocecal                            valve. The ileocecal valve, appendiceal orifice,                            and rectum were photographed. The quality of the                            bowel preparation was excellent. The colonoscopy                            was performed without difficulty. The patient                            tolerated the procedure well. The bowel preparation  used was SUPREP. Scope In: 8:58:15 AM Scope Out: 9:11:09 AM Scope Withdrawal Time: 0 hours 10 minutes 45 seconds  Total Procedure Duration: 0 hours 12 minutes 54 seconds  Findings:                 Multiple diverticula were found in the sigmoid                            colon.                           Internal hemorrhoids were found during retroflexion.                           The exam was otherwise without abnormality on                            direct and retroflexion views. Complications:            No immediate complications. Estimated blood loss:                            None. Estimated Blood Loss:     Estimated blood loss: none. Impression:               - Diverticulosis in the sigmoid colon.                           - Internal hemorrhoids.                           - The examination was otherwise normal on direct                            and retroflexion views.                           - No specimens  collected. Recommendation:           - Repeat colonoscopy in 5 years for surveillance.                           - Patient has a contact number available for                            emergencies. The signs and symptoms of potential                            delayed complications were discussed with the                            patient. Return to normal activities tomorrow.                            Written discharge instructions were provided to the                            patient.                           -  Resume previous diet.                           - Continue present medications. Docia Chuck. Russell Pastor, MD 02/01/2016 9:15:31 AM This report has been signed electronically.

## 2016-02-01 NOTE — Patient Instructions (Signed)
YOU HAD AN ENDOSCOPIC PROCEDURE TODAY AT THE [facility]:   Refer to the procedure report that was given to you for any specific questions about what was found during the examination.  If the procedure report does not answer your questions, please call your gastroenterologist to clarify.  If you requested that your care partner not be given the details of your procedure findings, then the procedure report has been included in a sealed envelope for you to review at your convenience later.  YOU SHOULD EXPECT: Some feelings of bloating in the abdomen. Passage of more gas than usual.  Walking can help get rid of the air that was put into your GI tract during the procedure and reduce the bloating. If you had a lower endoscopy (such as a colonoscopy or flexible sigmoidoscopy) you may notice spotting of blood in your stool or on the toilet paper. If you underwent a bowel prep for your procedure, you may not have a normal bowel movement for a few days.  Please Note:  You might notice some irritation and congestion in your nose or some drainage.  This is from the oxygen used during your procedure.  There is no need for concern and it should clear up in a day or so.  SYMPTOMS TO REPORT IMMEDIATELY:   Following lower endoscopy (colonoscopy or flexible sigmoidoscopy):  Excessive amounts of blood in the stool  Significant tenderness or worsening of abdominal pains  Swelling of the abdomen that is new, acute  Fever of 100F or higher   Following upper endoscopy (EGD)  Vomiting of blood or coffee ground material  New chest pain or pain under the shoulder blades  Painful or persistently difficult swallowing  New shortness of breath  Fever of 100F or higher  Black, tarry-looking stools  For urgent or emergent issues, a gastroenterologist can be reached at any hour by calling [phone].   DIET:  We do recommend a small meal at first, but then you may proceed to your regular diet.  Drink  plenty of fluids but you should avoid alcoholic beverages for 24 hours.  ACTIVITY:  You should plan to take it easy for the rest of today and you should NOT DRIVE or use heavy machinery until tomorrow (because of the sedation medicines used during the test).    FOLLOW UP: Our staff will call the number listed on your records the next business day following your procedure to check on you and address any questions or concerns that you may have regarding the information given to you following your procedure. If we do not reach you, we will leave a message.  However, if you are feeling well and you are not experiencing any problems, there is no need to return our call.  We will assume that you have returned to your regular daily activities without incident.  If any biopsies were taken you will be contacted by phone or by letter within the next 1-3 weeks.  Please call us at [phone] if you have not heard about the biopsies in 3 weeks.    SIGNATURES/CONFIDENTIALITY: You and/or your care partner have signed paperwork which will be entered into your electronic medical record.  These signatures attest to the fact that that the information above on your After Visit Summary has been reviewed and is understood.  Full responsibility of the confidentiality of this discharge information lies with you and/or your care-partner.  Diverticulosis, high fiber diet, and hemorrhoid information given. 

## 2016-02-02 ENCOUNTER — Telehealth: Payer: Self-pay

## 2016-02-02 NOTE — Telephone Encounter (Signed)
  Follow up Call-  Call back number 02/01/2016  Post procedure Call Back phone  # [phone]  Permission to leave phone message Yes  Some recent data might be hidden     Patient questions:  Do you have a fever, pain , or abdominal swelling? No. Pain Score  0 *  Have you tolerated food without any problems? Yes.    Have you been able to return to your normal activities? Yes.    Do you have any questions about your discharge instructions: Diet   No. Medications  No. Follow up visit  No.  Do you have questions or concerns about your Care? No.  Actions: * If pain score is 4 or above: No action needed, pain <4.

## 2016-02-09 DIAGNOSIS — Z85820 Personal history of malignant melanoma of skin: Secondary | ICD-10-CM | POA: Diagnosis not present

## 2016-02-09 DIAGNOSIS — Z6827 Body mass index (BMI) 27.0-27.9, adult: Secondary | ICD-10-CM | POA: Diagnosis not present

## 2016-02-09 DIAGNOSIS — C4362 Malignant melanoma of left upper limb, including shoulder: Secondary | ICD-10-CM | POA: Diagnosis not present

## 2016-02-09 DIAGNOSIS — Z9889 Other specified postprocedural states: Secondary | ICD-10-CM | POA: Diagnosis not present

## 2016-02-09 DIAGNOSIS — C4442 Squamous cell carcinoma of skin of scalp and neck: Secondary | ICD-10-CM | POA: Diagnosis not present

## 2016-02-22 DIAGNOSIS — C4442 Squamous cell carcinoma of skin of scalp and neck: Secondary | ICD-10-CM | POA: Diagnosis not present

## 2016-02-22 DIAGNOSIS — C434 Malignant melanoma of scalp and neck: Secondary | ICD-10-CM | POA: Diagnosis not present

## 2016-02-22 DIAGNOSIS — Z87891 Personal history of nicotine dependence: Secondary | ICD-10-CM | POA: Diagnosis not present

## 2016-02-22 DIAGNOSIS — Z85820 Personal history of malignant melanoma of skin: Secondary | ICD-10-CM | POA: Diagnosis not present

## 2016-02-22 HISTORY — PX: OTHER SURGICAL HISTORY: SHX169

## 2016-02-29 DIAGNOSIS — C4442 Squamous cell carcinoma of skin of scalp and neck: Secondary | ICD-10-CM | POA: Diagnosis not present

## 2016-03-08 DIAGNOSIS — Z09 Encounter for follow-up examination after completed treatment for conditions other than malignant neoplasm: Secondary | ICD-10-CM | POA: Diagnosis not present

## 2016-03-08 DIAGNOSIS — C4442 Squamous cell carcinoma of skin of scalp and neck: Secondary | ICD-10-CM | POA: Diagnosis not present

## 2016-03-21 DIAGNOSIS — Z85828 Personal history of other malignant neoplasm of skin: Secondary | ICD-10-CM | POA: Diagnosis not present

## 2016-03-21 DIAGNOSIS — L570 Actinic keratosis: Secondary | ICD-10-CM | POA: Diagnosis not present

## 2016-03-21 DIAGNOSIS — X32XXXD Exposure to sunlight, subsequent encounter: Secondary | ICD-10-CM | POA: Diagnosis not present

## 2016-03-21 DIAGNOSIS — Z08 Encounter for follow-up examination after completed treatment for malignant neoplasm: Secondary | ICD-10-CM | POA: Diagnosis not present

## 2016-03-23 ENCOUNTER — Ambulatory Visit (INDEPENDENT_AMBULATORY_CARE_PROVIDER_SITE_OTHER): Payer: BLUE CROSS/BLUE SHIELD | Admitting: Pulmonary Disease

## 2016-03-23 ENCOUNTER — Other Ambulatory Visit (INDEPENDENT_AMBULATORY_CARE_PROVIDER_SITE_OTHER): Payer: Medicare Other

## 2016-03-23 ENCOUNTER — Encounter: Payer: Self-pay | Admitting: Pulmonary Disease

## 2016-03-23 ENCOUNTER — Ambulatory Visit (INDEPENDENT_AMBULATORY_CARE_PROVIDER_SITE_OTHER)
Admission: RE | Admit: 2016-03-23 | Discharge: 2016-03-23 | Disposition: A | Discharge status: Home / Self Care | Payer: Medicare Other | Source: Ambulatory Visit | Attending: Pulmonary Disease | Admitting: Pulmonary Disease

## 2016-03-23 VITALS — BP 106/82 | HR 52 | Temp 97.2°F | Ht 72.0 in | Wt 191.4 lb

## 2016-03-23 DIAGNOSIS — E7800 Pure hypercholesterolemia, unspecified: Secondary | ICD-10-CM

## 2016-03-23 DIAGNOSIS — F17200 Nicotine dependence, unspecified, uncomplicated: Secondary | ICD-10-CM | POA: Diagnosis not present

## 2016-03-23 DIAGNOSIS — I700 Atherosclerosis of aorta: Secondary | ICD-10-CM | POA: Diagnosis not present

## 2016-03-23 DIAGNOSIS — M150 Primary generalized (osteo)arthritis: Secondary | ICD-10-CM

## 2016-03-23 DIAGNOSIS — Z23 Encounter for immunization: Secondary | ICD-10-CM

## 2016-03-23 DIAGNOSIS — C4492 Squamous cell carcinoma of skin, unspecified: Secondary | ICD-10-CM

## 2016-03-23 DIAGNOSIS — C439 Malignant melanoma of skin, unspecified: Secondary | ICD-10-CM

## 2016-03-23 DIAGNOSIS — D126 Benign neoplasm of colon, unspecified: Secondary | ICD-10-CM

## 2016-03-23 DIAGNOSIS — K5730 Diverticulosis of large intestine without perforation or abscess without bleeding: Secondary | ICD-10-CM

## 2016-03-23 DIAGNOSIS — Z0000 Encounter for general adult medical examination without abnormal findings: Secondary | ICD-10-CM

## 2016-03-23 DIAGNOSIS — M159 Polyosteoarthritis, unspecified: Secondary | ICD-10-CM

## 2016-03-23 LAB — CBC WITH DIFFERENTIAL/PLATELET
Basophils Absolute: 0 10*3/uL (ref 0.0–0.1)
Basophils Relative: 0.5 % (ref 0.0–3.0)
EOS ABS: 0.4 10*3/uL (ref 0.0–0.7)
Eosinophils Relative: 5.1 % — ABNORMAL HIGH (ref 0.0–5.0)
HCT: 45.7 % (ref 39.0–52.0)
HEMOGLOBIN: 15.7 g/dL (ref 13.0–17.0)
Lymphocytes Relative: 29.9 % (ref 12.0–46.0)
Lymphs Abs: 2.5 10*3/uL (ref 0.7–4.0)
MCHC: 34.3 g/dL (ref 30.0–36.0)
MCV: 95.2 fl (ref 78.0–100.0)
MONO ABS: 0.5 10*3/uL (ref 0.1–1.0)
Monocytes Relative: 6.1 % (ref 3.0–12.0)
Neutro Abs: 4.8 10*3/uL (ref 1.4–7.7)
Neutrophils Relative %: 58.4 % (ref 43.0–77.0)
Platelets: 216 10*3/uL (ref 150.0–400.0)
RBC: 4.8 Mil/uL (ref 4.22–5.81)
RDW: 13.1 % (ref 11.5–15.5)
WBC: 8.2 10*3/uL (ref 4.0–10.5)

## 2016-03-23 LAB — COMPREHENSIVE METABOLIC PANEL
ALT: 23 U/L (ref 0–53)
AST: 21 U/L (ref 0–37)
Albumin: 4 g/dL (ref 3.5–5.2)
Alkaline Phosphatase: 74 U/L (ref 39–117)
BUN: 13 mg/dL (ref 6–23)
CALCIUM: 9.3 mg/dL (ref 8.4–10.5)
CHLORIDE: 106 meq/L (ref 96–112)
CO2: 30 meq/L (ref 19–32)
CREATININE: 0.8 mg/dL (ref 0.40–1.50)
GFR: 102.14 mL/min (ref >60.00)
Glucose, Bld: 99 mg/dL (ref 70–99)
Potassium: 4.6 mEq/L (ref 3.5–5.1)
Sodium: 143 mEq/L (ref 135–145)
Total Bilirubin: 1 mg/dL (ref 0.2–1.2)
Total Protein: 6.9 g/dL (ref 6.0–8.3)

## 2016-03-23 LAB — LIPID PANEL
CHOL/HDL RATIO: 3
Cholesterol: 164 mg/dL (ref 0–200)
HDL: 63.6 mg/dL (ref >39.00)
LDL CALC: 84 mg/dL (ref 0–99)
NonHDL: 100.17
TRIGLYCERIDES: 83 mg/dL (ref 0.0–149.0)
VLDL: 16.6 mg/dL (ref 0.0–40.0)

## 2016-03-23 LAB — PSA: PSA: 1.45 ng/mL (ref 0.10–4.00)

## 2016-03-23 LAB — TSH: TSH: 0.78 u[IU]/mL (ref 0.35–4.50)

## 2016-03-23 LAB — VITAMIN D 25 HYDROXY (VIT D DEFICIENCY, FRACTURES): VITD: 36.66 ng/mL (ref 30.00–100.00)

## 2016-03-23 NOTE — Progress Notes (Signed)
Subjective:     Patient ID: Russell [name], male   DOB: 24-Apr-1946, 70 y.o.   MRN: [id number]  HPI 70 y/o WM here for a follow up visit...   ~  August 22, 2010:  Yearly follow up- doing well overall notes some left knee pain (he works out in gym 5d/wk +golf etc & uses Advil Prn), occas nasal drainage & epistaxis (wife made appt w/ ENT later this week- we discussed saline)...    On Simva40 for Chol & FLP 3/12 shows TChol 157, TG 54, HDL 54, LDL 92> continue same +diet etc...  EKG= SBrady 54/min, WNL.Marland KitchenMarland Kitchen CPX labs all look good (he didn't go for his XRay today)...  ~  March 19, 2012:  19 month ROV & CPX> Russell [name] has had a good interval except for the right kidney stone 7-8/12, treated via ER & was able to pass, then seen by Urology (per pt hx we do not have notes & nothing in Epic), sounds like calc oxalate stones w/ rec for dietary adjust- no colas, tea, etc & increase citrate; he has been doing well since then & no known recurrence...    His only meds are ASA81 and Simva40> tol well & FLP today is good- all parameters at goals, continue same...  His BP is sl elev today at 160/88 but he's had a stressful morn, & BP's at his last two blood donor appts= 110-120/ 60-70's;  He exercises regularly w/o difficulty...    We reviewed prob list, meds, xrays and labs> see below for updates >> he'll get flu vaccine next week...  CXR 10/13 showed normal heart size, clear lungs, tiny granuloma left base, NAD...  LABS 10/13:  FLP- at goals on Simva40;  Chems- wnl;  CBC- wnl;  TSH=0.83;  PSA= 1.23;  Urine- clear  ~  March 19, 2013:  Yearly ROV & Russell [name] is doing well x for some left knee pain & he has appt w/ Ortho next week; a business assoc recently passed from metastatic colon ca; he exercises regularly- 5d per week at sport time... We reviewed the following medical problems during today's office visit >>     Smoker & Abn CXR> he smokes cigars on the golf course & has a sm LLL granuloma on CXR; he remains  asymptomatic w/o cough, sput, SOB, CP, etc...    CHOL> on Simva40; prev LDL as high as 150 range on diet alone; FLP 10/14 shows TChol 149, TG 28, HDL 62, LDL 81    GI- divertics, colon polyps & hems> grandfather had colon cancer; pt noted rectal bleeding 5/14 & saw DrPerry in f/u> colonoscopy done 6/14 w/ divertics, hems, & 4 polyps- tubulovillous adenomas & f/u planned 3-33yrs    GU- hx prostatitis & kid stones> had stone 2012 able to pass it; Urology eval by DrOttelin & diet adjust; no recurrent problem since then...    DJD> he has seen DrYates in the past & had left knee arthroscopy; says he was told bone on bone & would need replacement; he takes 3 Advil prior to golfing...    Hx skin cancer> his friend and business partner died from metastatic melanoma; pt has DrHall check his skin every 17mo...    Blood donor> his has donated over 150 pts he says; continues to donate regularly; labs 10/14 showed Hg= 15.3 We reviewed prob list, meds, xrays and labs> see below for updates >>   CXR 10/14 showed normal heart size, clear lungs, 65mm granuloma LLL, NAD.Marland KitchenMarland Kitchen  EKG-  SBrady, rate54, wnl, NAD...  LABS 10/14:  FLP- at goals on Simva40;  Chems- wnl;  CBC- wnl;  TSH=1.14;  PSA=1.43...   ~  March 19, 2014:  Yearly ROV & review of medical problems> Russell [name] had his left TKR 11/14 by DrYates then had complic w/ patella dislocation requiring a se4cond surg by DrDuda 2 wks later; this was followed by rehab & now back to normal & he is very pleased- fully recovered, playing golf, etc... 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 We reviewed the following medical problems during today's office visit >>     Smoker & Abn CXR> he smokes cigars on the golf course & has a sm LLL granuloma on CXR; he remains asymptomatic w/o cough, sput, SOB, CP, etc...    CHOL> on Simva40; prev LDL as high as 150 range on diet alone; FLP 10/15 shows TChol 150, TG 53, HDL 56, LDL  83    GI- divertics, colon polyps & hems> grandfather had colon cancer; pt noted rectal bleeding 5/14 & saw DrPerry in f/u> colonoscopy done 6/14 w/ divertics, hems, & 4 polyps- tubulovillous adenomas & f/u planned 3-60yrs    GU- hx prostatitis & kid stones> had stone 2012 able to pass it; Urology eval by DrOttelin & diet adjust; no recurrent problem since then...    DJD> he has seen DrYates in the past & had left knee arthroscopy; finally had left TKR 40/10 w/ complic (patella dislocation) requiring surg 2wks later; s/p rehab & back to normal now...    Hx skin cancer> his friend and business partner died from metastatic melanoma; pt has DrHall check his skin every 97mo...    Blood donor> his has donated over 150 pts he says; continues to donate regularly; labs 10/14 showed Hg= 15.3 We reviewed prob list, meds, xrays and labs> see below for updates >> he will get Flu shot at work in 2 wks...   CXR 10/15 showed norm heart size, mild tortuosity of Ao, clear lungs, NAD...  LABS 10/15:  FLP- at goals on Simva40;  Chems- wnl;  CBC- wnl;  TSH=0.85;  PSA=1.73...  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 otherw feeling well- no new complaints or concerns... We reviewed the following medical problems during today's office visit >>     Smoker & Abn CXR> he smokes cigars on the golf course & has a sm LLL granuloma on CXR; he remains asymptomatic w/o cough, sput, SOB, CP, etc...    CHOL> on Simva40; prev LDL as high as 150 range on diet alone; FLP 10/16 shows TChol 159, TG 54, HDL 65, LDL 84    GI- divertics, colon polyps & hems> grandfather had colon cancer; pt noted rectal bleeding 5/14 & saw DrPerry in f/u> colonoscopy done 6/14 w/ divertics, hems, & 4 polyps-  tubulovillous adenomas & f/u planned 3-76yrs    GU- hx prostatitis & kid stones> had stone 2012 able to pass it; Urology eval by DrOttelin & diet adjust; no recurrent problem since then...    DJD> he has seen DrYates in the past & had left knee arthroscopy; finally had left TKR 27/25 w/ complic (patella dislocation) requiring surg 2wks later; s/p rehab &  back to normal now...    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    Blood donor> his has donated over 150 pts he says; continues to donate regularly; labs 10/14 showed Hg= 15.3 EXAM shows Afeb, VSS, O2sat=96% on RA;  Wt=190# stable;  HEENT- neg, mallampati2;  Chest- clear w/o w/r/r;  Heart- RR w/o m/r/g;  Abd- soft, nontender, neg;  Ext- neg w/o c/c/e;  Neuro- intact...   CXR 03/24/15 showed norm heart size, clear lungs, NAD.Marland KitchenMarland Kitchen   EKG 03/24/15 showed SBrady, rate50, WNL, NAD...  LABS 03/2015>  FLP at goals on Simva40;  Chems- wnl;  CBC- wnl;  TSH=1.10;  PSA=1.00  IMP/PLANS>>  Russell [name] was given 2016 Flu vaccine & the Prevnar-13 shot today; he will maintain f/u w/ Derm & [facility] surgery; continue same meds...  ~  March 23, 2016:  3yr ROV & Russell [name] returns for a follow up visit, doing satis, no new complaints or concerns; he continues to f/u w/ DERM regularly & recently had a few placed frozen... We reviewed the following medical problems during today's office visit >>     Smoker & Abn CXR> he smokes cigars on the golf course & has a sm LLL granuloma on CXR; he remains asymptomatic w/o cough, sput, SOB, CP, etc...    CHOL> on Simva40; prev LDL as high as 150 range on diet alone; FLP 10/17 shows TChol 164, TG 83, HDL 64, LDL 84    GI- divertics, colon polyps & hems> grandfather had colon cancer; pt noted rectal bleeding 5/14 & saw DrPerry in f/u> colonoscopy done 6/14 w/  divertics, hems, & 4 polyps- tubulovillous adenomas & f/u planned 3-51yrs    GU- hx prostatitis & kid stones> had stone 2012 able to pass it; Urology eval by DrOttelin & diet adjust; no recurrent problem since then...    DJD> he has seen DrYates in the past & had left knee arthroscopy; finally had left TKR 99991111 w/ complic (patella dislocation) requiring surg 2wks later; s/p rehab & back to normal now...    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    Blood donor> his has donated over 150 pts he says; continues to donate regularly; labs 10/14 showed Hg= 15.3 EXAM shows Afeb, VSS, O2sat=96% on RA;  Wt=192# stable;  HEENT- neg, mallampati2;  Chest- clear w/o w/r/r;  Heart- RR w/o m/r/g;  Abd- soft, nontender, neg;  Ext- neg w/o c/c/e;  Neuro- intact...   CXR 03/23/16 showed norm heart size, calcif Ao, clear lungs- NAD...   LABS 03/23/16>  FLP- at goals on Simva40;  Chems- wnl;  CBC- wnl;  TSH=0.78;  PSA=1.45;  VitD=37=> rec 1000u/d supplement... IMP/PLAN>>  Russell [name] is stable- given 2017 flu vaccine today & rec to continue same + diet & exercise...          Problem List:   SMOKER (ICD-305.1) - he is a cigar smoker and states that he does not inhale... smokers only occas "when I play golf or go to the bar"... ~  CXR 3/12 showed norm heart size, mild interstitial thickening, 63mm calc granuloma left lung base. ~  CTAbd 7/12  in ER for kidney stone showed incidental mild bullous changes at lung bases & bibasilar atx; also atherosclerotic changes in Ao & branches w/o aneurysm.  ~  CXR 10/13 showed tiny calcif granuloma at left lung base, norm heart size, otherw clear lungs... ~  CXR 10/14 showed normal heart size, clear lungs, 38mm granuloma LLL, NAD ~  CXR 10/15 showed norm heart size, mild tortuosity of Ao, clear lungs,  NAD ~  CXR 10/16 showed norm heart size, clear lungs, NAD...   HYPERCHOLESTEROLEMIA, BORDERLINE (ICD-272.4) - on diet Rx alone & prev not interested in med Rx... now he states he will consider medication if no better on his diet... ~  [place] 11/06 showed TChol 210, Tg 41, HDL 59, LDL 138... pt refers diet Rx. ~  [place] 12/09 showed TChol 219, Tg 43, HDL 53, LDL 141... rec> start Simva but he refuses, prefers diet. ~  FLP 12/10 showed TChol  222, TG 111, HDL 53, LDL 153... rec> start Simva40. ~  [place] 3/11 on Simva40 showed TChol 141, TG 36, HDL 59, LDL 75... continue Simva40. ~  [place] 3/12 on Simva40 showed TChol 157, TG 54, HDL 54, LDL 92 ~  FLP 10/13 on Simva40 showed TChol 163, TG 35, HDL 73, LDL 83 ~  FLP 10/14 on Simva40 showed TChol 149, TG 28, HDL 62, LDL 81  ~  FLP 10/15 on Simva40 showed TChol 150, TG 53, HDL 56, LDL 83 ~  FLP 10/16 on Simva40 showed TChol 159, TG 54, HDL 65, LDL 84  Hx of COLONIC POLYPS (ICD-211.3), & HEMORRHOIDS (ICD-455.6) - + FamHx colon cancer in his grandfather... last colonoscopy 2/08 by DrPerry showed 17mm polyp & hems... path= polypoid mucosa w/o adenomatous change... f/u planned 60yrs. ~  grandfather had colon cancer; pt noted rectal bleeding 5/14 & saw DrPerry in f/u> colonoscopy done 6/14 w/ divertics, hems, & 4 polyps- tubulovillous adenomas & f/u planned 3-58yrs  Hx of ACUTE PROSTATITIS (ICD-601.0) - prev eval & Rx by DrWrenn... no symptoms...  KIDNEY STONES >>  ~  New onset problem 7/12 & went to ER for treatment; Rx conservatively & he was able to pass stone; followed up w/ Urology, DrOttelin (we don't have notes) & he reports stone was calc oxalate & rec to avoid colas, tea, etc; increase water intake & use citrate when poss... ~  CTAbd 7/12 showed mild bullous changes at lung bases & atx, +gallstones w/o inflamm, mild right hydronephrosis & hydroureter/ 67mm right UVJ stone; mild prostatic enlargement, multilevel DDD in spine, ateriosclerotic calcif in Ao &  branches... ~  XRay Abd in ER 8/12 showed mod stool burden, mild lumbar spondylosis, NAD... ~  No known recurrence of stone disease to date...  DEGENERATIVE JOINT DISEASE (ICD-715.90) - he had a prev left knee arthroscopy by DrYates in 2008... he take 3 ADVIL prior to golfing... ~  10/14: states he was prev told bone on bone in knee & he'd need TKR per DrYates=> done 99991111 w/ complic (patella dislocation) requiring surg 2wks later; s/p rehab & back to normal now...  SKIN CANCER, HX OF (ICD-V10.83) - he sees DrHall for skin check every 6 months... 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 ~  [id number]:  DrHall found MELANOMA on left post shoulder- referred to Surg Oncology DrOllila at [facility] for wider excision, mapping, & sentinel node Bx- margins clear & LN was neg; they continue to follow...  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 ~  labs 12/09 showed Hg= 15.7 ~  labs 12/10 showed Hg= 16.6 ~  Labs 3/12 showed Hg= 16.1 ~  Labs 10/13 showed Hg= 15.0 ~  Labs 10/14 showed Hg= 15.3 ~  Labs 10/15 showed Hg= 15.5 ~  Labs 10/16 showed Hg= 15.9  HEALTH MAINTENANCE:  he takes ASA 81mg  on MWF since he has epistaxis on daily ASA early in 2009... ~  GI:  DrPerry & up to date on screening colonoscopy... ~  GU:  DrWrenn in the past... PSA 10/14 = 1.43 ~  Immuniz:  He is up to date on all indicated vaccinations...   Immunization History  Administered Date(s) Administered  . Influenza Whole 02/25/2010, 02/18/2012  . Influenza, High Dose Seasonal PF 04/01/2014, 03/23/2016  . Influenza,inj,Quad PF,36+ Mos 03/24/2015  . Influenza-Unspecified 02/17/2013  . Pneumococcal Polysaccharide-23 03/19/2013    Past Medical History:  Diagnosis Date  . Acute prostatitis   . Arthritis    hands  . Cancer ([place])    basal cell removed  . Dislocated patella 04/2013   S/P   ARTHROPLASTY  . Hemorrhoids   . History of skin cancer    . Hx of colonic polyps   . Kidney stone    passed stone - no surgery required  . Other and unspecified hyperlipidemia   . Smoker   . Whole blood donor     Past Surgical History:  Procedure Laterality Date  . COLONOSCOPY    . EYE SURGERY    . KNEE ARTHROPLASTY Left 04/30/2013   Procedure: COMPUTER ASSISTED TOTAL KNEE ARTHROPLASTY;  Surgeon: Marybelle Killings, MD;  Location: [place];  Service: Orthopedics;  Laterality: Left;  Left Cemented Total Knee Arthroplasty  . 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 Isaiah Blakes [facility]  . left knee arthroscopy  03/2007  . PATELLAR TENDON REPAIR Left 05/14/2013   Procedure: Left Total Knee Arthroplasty Medial Retinaculum Repair;  Surgeon: Marybelle Killings, MD;  Location: [place];  Service: Orthopedics;  Laterality: Left;  Left Total Knee Arthroplasty Medial Retinaculum Repair  . POLYPECTOMY    . skin cancer removed Left 2016   shoulder   . skin cancer removed N/A 02/22/2016   from top of his head.  this was done at [facility]  . TONSILLECTOMY AND ADENOIDECTOMY      Outpatient Encounter Prescriptions as of 03/23/2016  Medication Sig Dispense Refill  . aspirin 81 MG tablet 1 tablet by mouth three times per week.    . Multiple Vitamins-Minerals (MULTIVITAMIN ADULTS 50+ PO) Take 1 tablet by mouth daily.    . simvastatin (ZOCOR) 40 MG tablet Take 1 tablet (40 mg total) by mouth every evening. 90 tablet 4   Facility-Administered Encounter Medications as of 03/23/2016  Medication Dose Route Frequency Provider Last Rate Last Dose  . 0.9 %  sodium chloride infusion  500 mL Intravenous Continuous Irene Shipper, MD        No Known Allergies   Current Medications, Allergies, Past Medical History, Past Surgical History, Family History, and Social History were reviewed in [employer] record.   Review of Systems         The patient complains of joint pain and arthritis.  The patient denies fever, chills, sweats, anorexia, fatigue,  weakness, malaise, weight loss, sleep disorder, blurring, diplopia, eye irritation, eye discharge, vision loss, eye pain, photophobia, earache, ear discharge, tinnitus, decreased hearing, nasal congestion, nosebleeds,  sore throat, hoarseness, chest pain, palpitations, syncope, dyspnea on exertion, orthopnea, PND, peripheral edema, cough, dyspnea at rest, excessive sputum, hemoptysis, wheezing, pleurisy, nausea, vomiting, diarrhea, constipation, change in bowel habits, abdominal pain, melena, hematochezia, jaundice, gas/bloating, indigestion/heartburn, dysphagia, odynophagia, dysuria, hematuria, urinary frequency, urinary hesitancy, nocturia, incontinence, back pain, joint swelling, muscle cramps, muscle weakness, stiffness, sciatica, restless legs, leg pain at night, leg pain with exertion, rash, itching, dryness, suspicious lesions, paralysis, paresthesias, seizures, tremors, vertigo, transient blindness, frequent falls, frequent headaches, difficulty walking, depression, anxiety, memory loss, confusion, cold intolerance, heat intolerance, polydipsia, polyphagia, polyuria, unusual weight change, abnormal bruising, bleeding, enlarged lymph nodes, urticaria, allergic rash, hay fever, and recurrent infections.     Objective:   Physical Exam    WD, WN, 70 y/o WM in NAD...  GENERAL:  Alert & oriented; pleasant & cooperative... HEENT:  /AT, EOM-wnl, PERRLA, EACs-clear, TMs-wnl, NOSE-clear, THROAT-clear & wnl. NECK:  Supple w/ full ROM; no JVD; normal carotid impulses w/o bruits; no thyromegaly or nodules palpated; no lymphadenopathy. CHEST:  Clear to P & A; without wheezes/ rales/ or rhonchi. HEART:  Regular Rhythm; without murmurs/ rubs/ or gallops. ABDOMEN:  Soft & nontender; normal bowel sounds; no organomegaly or masses detected. RECTAL:  Neg - prostate 2+ & nontender w/o nodules; stool hematest neg. EXT: without deformities, mild arthritic changes; no varicose veins/ venous insuffic/ or  edema. NEURO:  CN's intact; motor testing normal; sensory testing normal; gait normal & balance OK. DERM:  No lesions noted; no rash etc...  RADIOLOGY DATA:  Reviewed in the EPIC EMR & discussed w/ the patient...  LABORATORY DATA:  Reviewed in the EPIC EMR & discussed w/ the patient...   Assessment:      Cigar SMOKER>  CXR is stable, NAD; 66mm calcif granuloma noted in LLL; CTAbd in 2012 showed incidental bullous changes at lung bases; must quit all smoking...  CHOL>  FLP looks good on Simva40; continue same + diet...  Hx Colon Polyps>  followed by DrPerry & f/u colon done 6/14 by DrPerry- 4 sm polyps removed & 3 were tubulovillous adenomas- f/u colon planned 32yrs per GI...  Hx Kid Stones & Prostatitis in past>  No recurrent kid stones or prostate symptoms; PSA= 1.43  DJD>  Followed by Avel Peace; s/p left TKR & doing well...  Skin Ca>  Followed by Natale Milch & pt reports doing OK...  ~  [id number]: Dx w/ melanoma left post shoulder area- [facility], referred to [place] at [facility] for wide excision, mapping, & sentine node bx=> all neg & they continue to follow.  Whole Blood Donor>  A+ blood type & has given ~150 pints to date...     Plan:     Patient's Medications  New Prescriptions   No medications on file  Previous Medications   ASPIRIN 81 MG TABLET    1 tablet by mouth three times per week.   MULTIPLE VITAMINS-MINERALS (MULTIVITAMIN ADULTS 50+ PO)    Take 1 tablet by mouth daily.   SIMVASTATIN (ZOCOR) 40 MG TABLET    Take 1 tablet (40 mg total) by mouth every evening.  Modified Medications   No medications on file  Discontinued Medications   No medications on file

## 2016-03-23 NOTE — Patient Instructions (Signed)
Today we updated your med list in our EPIC system...    Continue your current medications the same...  Today we checked your follow up CXR & FASTING blood work...    We will contact you w/ the results when available...   Keep up the good work w/ diet & exercise...  Continue your regular follow up w/ the Derm team....  Call for any questions...  Let's plan a follow up visit in 42yr sooner if needed for problems.Marland KitchenMarland Kitchen

## 2016-04-19 DEATH — deceased

## 2016-05-10 DIAGNOSIS — X32XXXD Exposure to sunlight, subsequent encounter: Secondary | ICD-10-CM | POA: Diagnosis not present

## 2016-05-10 DIAGNOSIS — L570 Actinic keratosis: Secondary | ICD-10-CM | POA: Diagnosis not present

## 2016-06-28 DIAGNOSIS — L820 Inflamed seborrheic keratosis: Secondary | ICD-10-CM | POA: Diagnosis not present

## 2016-06-28 DIAGNOSIS — Z1283 Encounter for screening for malignant neoplasm of skin: Secondary | ICD-10-CM | POA: Diagnosis not present

## 2016-06-28 DIAGNOSIS — X32XXXD Exposure to sunlight, subsequent encounter: Secondary | ICD-10-CM | POA: Diagnosis not present

## 2016-06-28 DIAGNOSIS — L570 Actinic keratosis: Secondary | ICD-10-CM | POA: Diagnosis not present

## 2016-07-24 ENCOUNTER — Other Ambulatory Visit: Payer: Self-pay | Admitting: Pulmonary Disease

## 2016-09-05 DIAGNOSIS — Z6826 Body mass index (BMI) 26.0-26.9, adult: Secondary | ICD-10-CM | POA: Diagnosis not present

## 2016-09-05 DIAGNOSIS — C4442 Squamous cell carcinoma of skin of scalp and neck: Secondary | ICD-10-CM | POA: Diagnosis not present

## 2016-09-05 DIAGNOSIS — C4362 Malignant melanoma of left upper limb, including shoulder: Secondary | ICD-10-CM | POA: Diagnosis not present

## 2016-10-20 DIAGNOSIS — X32XXXD Exposure to sunlight, subsequent encounter: Secondary | ICD-10-CM | POA: Diagnosis not present

## 2016-10-20 DIAGNOSIS — L570 Actinic keratosis: Secondary | ICD-10-CM | POA: Diagnosis not present

## 2016-11-27 ENCOUNTER — Telehealth: Payer: Self-pay | Admitting: Pulmonary Disease

## 2016-11-27 ENCOUNTER — Encounter: Payer: Self-pay | Admitting: Pulmonary Disease

## 2016-11-27 ENCOUNTER — Ambulatory Visit (INDEPENDENT_AMBULATORY_CARE_PROVIDER_SITE_OTHER): Payer: Medicare Other | Admitting: Pulmonary Disease

## 2016-11-27 VITALS — BP 124/80 | HR 55 | Temp 97.5°F | Ht 72.0 in | Wt 189.4 lb

## 2016-11-27 DIAGNOSIS — J069 Acute upper respiratory infection, unspecified: Secondary | ICD-10-CM | POA: Diagnosis not present

## 2016-11-27 MED ORDER — AZITHROMYCIN 250 MG PO TABS: ORAL_TABLET | ORAL | 0 refills | Status: DC

## 2016-11-27 MED ORDER — PREDNISONE 20 MG PO TABS: ORAL_TABLET | ORAL | 0 refills | Status: DC

## 2016-11-27 MED ORDER — METHYLPREDNISOLONE ACETATE 80 MG/ML IJ SUSP
80.0000 mg | Freq: Once | INTRAMUSCULAR | Status: AC
  Administered 2016-11-27: 80 mg via INTRAMUSCULAR

## 2016-11-27 NOTE — Patient Instructions (Signed)
Today we updated your med list in our EPIC system...    Continue your current medications the same...  For your upper respiratory infection>>    Take the ZPak antibiotic as directed on the pack...    We gave you a Depo shot today for the inflammation...    Take the PREDNISONE 20mg  tabs as follows>>       Start in the AM 6/12 w/ one tab twice daily for 3 days...       Then decrease to one tab each AM for 3 days...       Then decrease to 1/2 tab each AM til gone...  Be sure to travel w/ some cough drops, throat lozenges, etc...  Call for any questions.Marland KitchenMarland Kitchen

## 2016-11-27 NOTE — Progress Notes (Signed)
Subjective:     Patient ID: Russell [name], male   DOB: 24-Apr-1946, 71 y.o.   MRN: [id number]  HPI 71 y/o WM here for a follow up visit...   ~  August 22, 2010:  Yearly follow up- doing well overall notes some left knee pain (he works out in gym 5d/wk +golf etc & uses Advil Prn), occas nasal drainage & epistaxis (wife made appt w/ ENT later this week- we discussed saline)...    On Simva40 for Chol & FLP 3/12 shows TChol 157, TG 54, HDL 54, LDL 92> continue same +diet etc...  EKG= SBrady 54/min, WNL.Marland KitchenMarland Kitchen CPX labs all look good (he didn't go for his XRay today)...  ~  March 19, 2012:  19 month ROV & CPX> Russell [name] has had a good interval except for the right kidney stone 7-8/12, treated via ER & was able to pass, then seen by Urology (per pt hx we do not have notes & nothing in Epic), sounds like calc oxalate stones w/ rec for dietary adjust- no colas, tea, etc & increase citrate; he has been doing well since then & no known recurrence...    His only meds are ASA81 and Simva40> tol well & FLP today is good- all parameters at goals, continue same...  His BP is sl elev today at 160/88 but he's had a stressful morn, & BP's at his last two blood donor appts= 110-120/ 60-70's;  He exercises regularly w/o difficulty...    We reviewed prob list, meds, xrays and labs> see below for updates >> he'll get flu vaccine next week...  CXR 10/13 showed normal heart size, clear lungs, tiny granuloma left base, NAD...  LABS 10/13:  FLP- at goals on Simva40;  Chems- wnl;  CBC- wnl;  TSH=0.83;  PSA= 1.23;  Urine- clear  ~  March 19, 2013:  Yearly ROV & Russell [name] is doing well x for some left knee pain & he has appt w/ Ortho next week; a business assoc recently passed from metastatic colon ca; he exercises regularly- 5d per week at sport time... We reviewed the following medical problems during today's office visit >>     Smoker & Abn CXR> he smokes cigars on the golf course & has a sm LLL granuloma on CXR; he remains  asymptomatic w/o cough, sput, SOB, CP, etc...    CHOL> on Simva40; prev LDL as high as 150 range on diet alone; FLP 10/14 shows TChol 149, TG 28, HDL 62, LDL 81    GI- divertics, colon polyps & hems> grandfather had colon cancer; pt noted rectal bleeding 5/14 & saw DrPerry in f/u> colonoscopy done 6/14 w/ divertics, hems, & 4 polyps- tubulovillous adenomas & f/u planned 3-33yrs    GU- hx prostatitis & kid stones> had stone 2012 able to pass it; Urology eval by DrOttelin & diet adjust; no recurrent problem since then...    DJD> he has seen DrYates in the past & had left knee arthroscopy; says he was told bone on bone & would need replacement; he takes 3 Advil prior to golfing...    Hx skin cancer> his friend and business partner died from metastatic melanoma; pt has DrHall check his skin every 17mo...    Blood donor> his has donated over 150 pts he says; continues to donate regularly; labs 10/14 showed Hg= 15.3 We reviewed prob list, meds, xrays and labs> see below for updates >>   CXR 10/14 showed normal heart size, clear lungs, 65mm granuloma LLL, NAD.Marland KitchenMarland Kitchen  EKG-  SBrady, rate54, wnl, NAD...  LABS 10/14:  FLP- at goals on Simva40;  Chems- wnl;  CBC- wnl;  TSH=1.14;  PSA=1.43...   ~  March 19, 2014:  Yearly ROV & review of medical problems> Russell [name] had his left TKR 11/14 by DrYates then had complic w/ patella dislocation requiring a se4cond surg by DrDuda 2 wks later; this was followed by rehab & now back to normal & he is very pleased- fully recovered, playing golf, etc... 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 We reviewed the following medical problems during today's office visit >>     Smoker & Abn CXR> he smokes cigars on the golf course & has a sm LLL granuloma on CXR; he remains asymptomatic w/o cough, sput, SOB, CP, etc...    CHOL> on Simva40; prev LDL as high as 150 range on diet alone; FLP 10/15 shows TChol 150, TG 53, HDL 56, LDL  83    GI- divertics, colon polyps & hems> grandfather had colon cancer; pt noted rectal bleeding 5/14 & saw DrPerry in f/u> colonoscopy done 6/14 w/ divertics, hems, & 4 polyps- tubulovillous adenomas & f/u planned 3-60yrs    GU- hx prostatitis & kid stones> had stone 2012 able to pass it; Urology eval by DrOttelin & diet adjust; no recurrent problem since then...    DJD> he has seen DrYates in the past & had left knee arthroscopy; finally had left TKR 40/10 w/ complic (patella dislocation) requiring surg 2wks later; s/p rehab & back to normal now...    Hx skin cancer> his friend and business partner died from metastatic melanoma; pt has DrHall check his skin every 97mo...    Blood donor> his has donated over 150 pts he says; continues to donate regularly; labs 10/14 showed Hg= 15.3 We reviewed prob list, meds, xrays and labs> see below for updates >> he will get Flu shot at work in 2 wks...   CXR 10/15 showed norm heart size, mild tortuosity of Ao, clear lungs, NAD...  LABS 10/15:  FLP- at goals on Simva40;  Chems- wnl;  CBC- wnl;  TSH=0.85;  PSA=1.73...  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 otherw feeling well- no new complaints or concerns... We reviewed the following medical problems during today's office visit >>     Smoker & Abn CXR> he smokes cigars on the golf course & has a sm LLL granuloma on CXR; he remains asymptomatic w/o cough, sput, SOB, CP, etc...    CHOL> on Simva40; prev LDL as high as 150 range on diet alone; FLP 10/16 shows TChol 159, TG 54, HDL 65, LDL 84    GI- divertics, colon polyps & hems> grandfather had colon cancer; pt noted rectal bleeding 5/14 & saw DrPerry in f/u> colonoscopy done 6/14 w/ divertics, hems, & 4 polyps-  tubulovillous adenomas & f/u planned 3-76yrs    GU- hx prostatitis & kid stones> had stone 2012 able to pass it; Urology eval by DrOttelin & diet adjust; no recurrent problem since then...    DJD> he has seen DrYates in the past & had left knee arthroscopy; finally had left TKR 27/25 w/ complic (patella dislocation) requiring surg 2wks later; s/p rehab &  back to normal now...    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    Blood donor> his has donated over 150 pts he says; continues to donate regularly; labs 10/14 showed Hg= 15.3 EXAM shows Afeb, VSS, O2sat=96% on RA;  Wt=190# stable;  HEENT- neg, mallampati2;  Chest- clear w/o w/r/r;  Heart- RR w/o m/r/g;  Abd- soft, nontender, neg;  Ext- neg w/o c/c/e;  Neuro- intact...   CXR 03/24/15 showed norm heart size, clear lungs, NAD.Marland KitchenMarland Kitchen   EKG 03/24/15 showed SBrady, rate50, WNL, NAD...  LABS 03/2015>  FLP at goals on Simva40;  Chems- wnl;  CBC- wnl;  TSH=1.10;  PSA=1.00  IMP/PLANS>>  Russell [name] was given 2016 Flu vaccine & the Prevnar-13 shot today; he will maintain f/u w/ Derm & [facility] surgery; continue same meds...  ~  March 23, 2016:  63yr ROV & Russell [name] returns for a follow up visit, doing satis, no new complaints or concerns; he continues to f/u w/ DERM regularly & recently had a few placed frozen... We reviewed the following medical problems during today's office visit >>     Smoker & Abn CXR> he smokes cigars on the golf course & has a sm LLL granuloma on CXR; he remains asymptomatic w/o cough, sput, SOB, CP, etc...    CHOL> on Simva40; prev LDL as high as 150 range on diet alone; FLP 10/17 shows TChol 164, TG 83, HDL 64, LDL 84    GI- divertics, colon polyps & hems> grandfather had colon cancer; pt noted rectal bleeding 5/14 & saw DrPerry in f/u> colonoscopy done 6/14 w/  divertics, hems, & 4 polyps- tubulovillous adenomas & f/u planned 3-27yrs    GU- hx prostatitis & kid stones> had stone 2012 able to pass it; Urology eval by DrOttelin & diet adjust; no recurrent problem since then...    DJD> he has seen DrYates in the past & had left knee arthroscopy; finally had left TKR 76/28 w/ complic (patella dislocation) requiring surg 2wks later; s/p rehab & back to normal now...    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    Blood donor> his has donated over 150 pts he says; continues to donate regularly; labs 10/14 showed Hg= 15.3 EXAM shows Afeb, VSS, O2sat=96% on RA;  Wt=192# stable;  HEENT- neg, mallampati2;  Chest- clear w/o w/r/r;  Heart- RR w/o m/r/g;  Abd- soft, nontender, neg;  Ext- neg w/o c/c/e;  Neuro- intact...   CXR 03/23/16 showed norm heart size, calcif Ao, clear lungs- NAD...   LABS 03/23/16>  FLP- at goals on Simva40;  Chems- wnl;  CBC- wnl;  TSH=0.78;  PSA=1.45;  VitD=37=> rec 1000u/d supplement... IMP/PLAN>>  Russell [name] is stable- given 2017 flu vaccine today & rec to continue same + diet & exercise...   ~  November 27, 2016:  55mo ROV & pt requested add-on appt for URI as he is flying to [place] tomorrow on [employer] reports 1 wk hx mild sore throat, hoarseness, congestion, and blowing clear mucus (no color, no blood); he notes sl HA & mild cough w/ small amt beige sput;  He is a  cigar smoker (mostly on the golf course) and has hx abn CXR w/ LLL granuloma, otherw clear;  He also had melanoma surg 12/2014 left post shoulder- followed at [facility]...     EXAM shows Afeb, VSS, O2sat=98% on RA;  Wt=190# stable;  HEENT- sl hoarse, mallampati2;  Chest- clear w/o w/r/r;  Heart- RR w/o m/r/g;  Abd- soft, nontender, neg;  Ext- neg w/o c/c/e;  Neuro- intact...  IMP/PLAN>>  We decided to treat  w/ ZPak, Depo80, Pred taper (see AVS), and rec OTC meds for comfort including Delsym, throat lozenges etc...           Problem List:   SMOKER (ICD-305.1) - he is a cigar smoker and states that he does not inhale... smokers only occas "when I play golf or go to the bar"... ~  CXR 3/12 showed norm heart size, mild interstitial thickening, 77mm calc granuloma left lung base. ~  CTAbd 7/12 in ER for kidney stone showed incidental mild bullous changes at lung bases & bibasilar atx; also atherosclerotic changes in Ao & branches w/o aneurysm.  ~  CXR 10/13 showed tiny calcif granuloma at left lung base, norm heart size, otherw clear lungs... ~  CXR 10/14 showed normal heart size, clear lungs, 23mm granuloma LLL, NAD ~  CXR 10/15 showed norm heart size, mild tortuosity of Ao, clear lungs, NAD ~  CXR 10/16 showed norm heart size, clear lungs, NAD.Marland Kitchen.  ~  CXR 03/23/16 showed norm heart size, calcif Ao, clear lungs- NAD...   HYPERCHOLESTEROLEMIA, BORDERLINE (ICD-272.4) - on diet Rx alone & prev not interested in med Rx... now he states he will consider medication if no better on his diet... ~  [place] 11/06 showed TChol 210, Tg 41, HDL 59, LDL 138... pt refers diet Rx. ~  [place] 12/09 showed TChol 219, Tg 43, HDL 53, LDL 141... rec> start Simva but he refuses, prefers diet. ~  FLP 12/10 showed TChol  222, TG 111, HDL 53, LDL 153... rec> start Simva40. ~  [place] 3/11 on Simva40 showed TChol 141, TG 36, HDL 59, LDL 75... continue Simva40. ~  [place] 3/12 on Simva40 showed TChol 157, TG 54, HDL 54, LDL 92 ~  FLP 10/13 on Simva40 showed TChol 163, TG 35, HDL 73, LDL 83 ~  FLP 10/14 on Simva40 showed TChol 149, TG 28, HDL 62, LDL 81  ~  FLP 10/15 on Simva40 showed TChol 150, TG 53, HDL 56, LDL 83 ~  FLP 10/16 on Simva40 showed TChol 159, TG 54, HDL 65, LDL 84 ~  FLP 10/17 on Simva40 showed  TChol 164, TG 83, HDL 64, LDL 84  Hx of COLONIC POLYPS (ICD-211.3), & HEMORRHOIDS (ICD-455.6) - + FamHx colon cancer in his  grandfather... last colonoscopy 2/08 by DrPerry showed 5mm polyp & hems... path= polypoid mucosa w/o adenomatous change... f/u planned 19yrs. ~  grandfather had colon cancer; pt noted rectal bleeding 5/14 & saw DrPerry in f/u> colonoscopy done 6/14 w/ divertics, hems, & 4 polyps- tubulovillous adenomas & f/u planned 3-41yrs  Hx of ACUTE PROSTATITIS (ICD-601.0) - prev eval & Rx by DrWrenn... no symptoms...  KIDNEY STONES >>  ~  New onset problem 7/12 & went to ER for treatment; Rx conservatively & he was able to pass stone; followed up w/ Urology, DrOttelin (we don't have notes) & he reports stone was calc oxalate & rec to avoid colas, tea, etc; increase water intake & use citrate when poss... ~  CTAbd 7/12 showed mild bullous changes  at lung bases & atx, +gallstones w/o inflamm, mild right hydronephrosis & hydroureter/ 45mm right UVJ stone; mild prostatic enlargement, multilevel DDD in spine, ateriosclerotic calcif in Ao & branches... ~  XRay Abd in ER 8/12 showed mod stool burden, mild lumbar spondylosis, NAD... ~  No known recurrence of stone disease to date...  DEGENERATIVE JOINT DISEASE (ICD-715.90) - he had a prev left knee arthroscopy by DrYates in 2008... he take 3 ADVIL prior to golfing... ~  10/14: states he was prev told bone on bone in knee & he'd need TKR per DrYates=> done 16/10 w/ complic (patella dislocation) requiring surg 2wks later; s/p rehab & back to normal now...  SKIN CANCER, HX OF (ICD-V10.83) - he sees DrHall for skin check every 6 months... 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 ~  [id number]:  DrHall found MELANOMA on left post shoulder- referred to Surg Oncology DrOllila at [facility] for wider excision, mapping, & sentinel node Bx- margins clear & LN was neg; they continue to follow...  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 ~  labs 12/09 showed Hg= 15.7 ~  labs 12/10 showed Hg= 16.6 ~   Labs 3/12 showed Hg= 16.1 ~  Labs 10/13 showed Hg= 15.0 ~  Labs 10/14 showed Hg= 15.3 ~  Labs 10/15 showed Hg= 15.5 ~  Labs 10/16 showed Hg= 15.9 ~  Labs 10/17 showed Hg= 15.3  HEALTH MAINTENANCE:  he takes ASA 81mg  on MWF since he has epistaxis on daily ASA early in 2009... ~  GI:  DrPerry & up to date on screening colonoscopy... ~  GU:  DrWrenn in the past... PSA 10/14 = 1.43 ~  Immuniz:  He is up to date on all indicated vaccinations...   Immunization History  Administered Date(s) Administered  . Influenza Whole 02/25/2010, 02/18/2012  . Influenza, High Dose Seasonal PF 04/01/2014, 03/23/2016  . Influenza,inj,Quad PF,36+ Mos 03/24/2015  . Influenza-Unspecified 02/17/2013  . Pneumococcal Polysaccharide-23 03/19/2013    Past Medical History:  Diagnosis Date  . Acute prostatitis   . Arthritis    hands  . Cancer ([place])    basal cell removed  . Dislocated patella 04/2013   S/P   ARTHROPLASTY  . Hemorrhoids   . History of skin cancer   . Hx of colonic polyps   . Kidney stone    passed stone - no surgery required  . Other and unspecified hyperlipidemia   . Smoker   . Whole blood donor     Past Surgical History:  Procedure Laterality Date  . COLONOSCOPY    . EYE SURGERY    . KNEE ARTHROPLASTY Left 04/30/2013   Procedure: COMPUTER ASSISTED TOTAL KNEE ARTHROPLASTY;  Surgeon: Marybelle Killings, MD;  Location: [place];  Service: Orthopedics;  Laterality: Left;  Left Cemented Total Knee Arthroplasty  . 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 Isaiah Blakes at [facility]  . left knee arthroscopy  03/2007  . PATELLAR TENDON REPAIR Left 05/14/2013   Procedure: Left Total Knee Arthroplasty Medial Retinaculum Repair;  Surgeon: Marybelle Killings, MD;  Location: [place];  Service: Orthopedics;  Laterality: Left;  Left Total Knee Arthroplasty Medial Retinaculum Repair  . POLYPECTOMY    . skin cancer removed Left 2016   shoulder   . skin cancer removed N/A 02/22/2016   from top of his head.   this was done at [facility]  .  TONSILLECTOMY AND ADENOIDECTOMY      Outpatient Encounter Prescriptions as of 11/27/2016  Medication Sig  . aspirin 81 MG tablet 1 tablet by mouth three times per week.  . Multiple Vitamins-Minerals (MULTIVITAMIN ADULTS 50+ PO) Take 1 tablet by mouth daily.  . simvastatin (ZOCOR) 40 MG tablet TAKE ONE TABLET EVERY EVENING  . azithromycin (ZITHROMAX) 250 MG tablet Take as directed per package  . predniSONE (DELTASONE) 20 MG tablet 1 tab two times daily x 3 days, 1 tab daily x 3 days, 1/2 tab daily until gone    No Known Allergies   Current Medications, Allergies, Past Medical History, Past Surgical History, Family History, and Social History were reviewed in [employer] record.   Review of Systems         The patient complains of joint pain and arthritis.  The patient denies fever, chills, sweats, anorexia, fatigue, weakness, malaise, weight loss, sleep disorder, blurring, diplopia, eye irritation, eye discharge, vision loss, eye pain, photophobia, earache, ear discharge, tinnitus, decreased hearing, nasal congestion, nosebleeds, sore throat, hoarseness, chest pain, palpitations, syncope, dyspnea on exertion, orthopnea, PND, peripheral edema, cough, dyspnea at rest, excessive sputum, hemoptysis, wheezing, pleurisy, nausea, vomiting, diarrhea, constipation, change in bowel habits, abdominal pain, melena, hematochezia, jaundice, gas/bloating, indigestion/heartburn, dysphagia, odynophagia, dysuria, hematuria, urinary frequency, urinary hesitancy, nocturia, incontinence, back pain, joint swelling, muscle cramps, muscle weakness, stiffness, sciatica, restless legs, leg pain at night, leg pain with exertion, rash, itching, dryness, suspicious lesions, paralysis, paresthesias, seizures, tremors, vertigo, transient blindness, frequent falls, frequent headaches, difficulty walking, depression, anxiety, memory loss, confusion, cold intolerance, heat  intolerance, polydipsia, polyphagia, polyuria, unusual weight change, abnormal bruising, bleeding, enlarged lymph nodes, urticaria, allergic rash, hay fever, and recurrent infections.     Objective:   Physical Exam    WD, WN, 71 y/o WM in NAD...  GENERAL:  Alert & oriented; pleasant & cooperative... HEENT:  [place]/AT, EOM-wnl, PERRLA, EACs-clear, TMs-wnl, NOSE-clear, THROAT-clear & wnl. NECK:  Supple w/ full ROM; no JVD; normal carotid impulses w/o bruits; no thyromegaly or nodules palpated; no lymphadenopathy. CHEST:  Clear to P & A; without wheezes/ rales/ or rhonchi. HEART:  Regular Rhythm; without murmurs/ rubs/ or gallops. ABDOMEN:  Soft & nontender; normal bowel sounds; no organomegaly or masses detected. RECTAL:  Neg - prostate 2+ & nontender w/o nodules; stool hematest neg. EXT: without deformities, mild arthritic changes; no varicose veins/ venous insuffic/ or edema. NEURO:  CN's intact; motor testing normal; sensory testing normal; gait normal & balance OK. DERM:  No lesions noted; no rash etc...  RADIOLOGY DATA:  Reviewed in the EPIC EMR & discussed w/ the patient...  LABORATORY DATA:  Reviewed in the EPIC EMR & discussed w/ the patient...   Assessment:      11/27/16>  Russell [name] presents w/ URI, HA, sinus drainage, hoarseness, clear sput- we decided to treat w/ ZPak, Depo80, Pred taper (see AVS), and rec OTC meds for comfort including Delsym, throat lozenges etc...    Cigar SMOKER>  CXR is stable, NAD; 68mm calcif granuloma noted in LLL; CTAbd in 2012 showed incidental bullous changes at lung bases; must quit all smoking...  CHOL>  FLP looks good on Simva40; continue same + diet...  Hx Colon Polyps>  followed by DrPerry & f/u colon done 6/14 by DrPerry- 4 sm polyps removed & 3 were tubulovillous adenomas- f/u colon planned 32yrs per GI...  Hx Kid Stones & Prostatitis in past>  No recurrent kid stones or prostate symptoms; PSA= 1.43  DJD>  Followed by Avel Peace; s/p left TKR & doing  well...  Skin Ca>  Followed by Natale Milch & pt reports doing OK...  ~  [id number]: Dx w/ melanoma left post shoulder area- removed by [facility], referred to [place] at [facility] for wide excision, mapping, & sentine node bx=> all neg & they continue to follow.  Whole Blood Donor>  A+ blood type & has given ~150 pints to date...     Plan:     Patient's Medications  New Prescriptions   AZITHROMYCIN (ZITHROMAX) 250 MG TABLET    Take as directed per package   PREDNISONE (DELTASONE) 20 MG TABLET    1 tab two times daily x 3 days, 1 tab daily x 3 days, 1/2 tab daily until gone  Previous Medications   ASPIRIN 81 MG TABLET    1 tablet by mouth three times per week.   MULTIPLE VITAMINS-MINERALS (MULTIVITAMIN ADULTS 50+ PO)    Take 1 tablet by mouth daily.   SIMVASTATIN (ZOCOR) 40 MG TABLET    TAKE ONE TABLET EVERY EVENING  Modified Medications   No medications on file  Discontinued Medications   No medications on file

## 2016-11-27 NOTE — Telephone Encounter (Signed)
Spoke with pt. He has been added to SN's schedule today at 11am. Nothing further was needed.

## 2017-01-31 ENCOUNTER — Ambulatory Visit (INDEPENDENT_AMBULATORY_CARE_PROVIDER_SITE_OTHER): Payer: Medicare Other

## 2017-01-31 ENCOUNTER — Ambulatory Visit (INDEPENDENT_AMBULATORY_CARE_PROVIDER_SITE_OTHER): Payer: Medicare Other | Admitting: Orthopaedic Surgery

## 2017-01-31 ENCOUNTER — Encounter (INDEPENDENT_AMBULATORY_CARE_PROVIDER_SITE_OTHER): Payer: Self-pay | Admitting: Orthopaedic Surgery

## 2017-01-31 VITALS — BP 162/98 | HR 54 | Ht 72.0 in | Wt 188.0 lb

## 2017-01-31 DIAGNOSIS — M25562 Pain in left knee: Secondary | ICD-10-CM

## 2017-01-31 DIAGNOSIS — Z96652 Presence of left artificial knee joint: Secondary | ICD-10-CM

## 2017-01-31 NOTE — Progress Notes (Signed)
Office Visit Note   Patient: Russell [name]           Date of Birth: Apr 09, 1946           MRN: [id number] Visit Date: 01/31/2017              Requested by: Noralee Space, MD [place], [place] [id number] PCP: Noralee Space, MD   Assessment & Plan: Visit Diagnoses:  1. Acute pain of left knee   2. S/P total knee arthroplasty, left      Status post total knee arthroplasty with some quad tendon tendinopathy with calcification. No evidence of loosening or subsidence of his knee and normal patellar tracking.  Plan: He can use some Aleve when he goes spell and play golf. Prescription given in case his knee becomes painful and needs to use a cart intermittently. This is required and [place]. Office follow-up as needed.  Follow-Up Instructions: Return if symptoms worsen or fail to improve.   Orders:  Orders Placed This Encounter  Procedures  . XR KNEE 3 VIEW LEFT   No orders of the defined types were placed in this encounter.     Procedures: No procedures performed   Clinical Data: No additional findings.   Subjective: Chief Complaint  Patient presents with  . Left Knee - Pain    HPI Russell [name] returns for follow-up after knee arthroplasty 4 years ago. He had to have repeat surgery for patellar lateral complete dislocation ultimately had a lateral release with medial retinacular repair and plication. He's had some soreness in his knee. He thinks he may have lost about 10 of flexion. Sometimes some discomfort when he goes downstairs. He denies any significant swelling. He's had some soreness resolved off course and his concern is that he is leaving in September to go to [place] for multiple [place] courses that he will walk. No fever or chills. No swelling of his ankle he denies groin pain. Opposite knee is doing well without swelling or catching.  Review of Systems is systems is updated and is unchanged. Positive for total knee arthroplasty on the left. History of melanoma. He  is a smoker with the yearly normal chest x-ray, history of vessel or granuloma of the lung, kidney stones   Objective: Vital Signs: BP (!) 162/98   Pulse (!) 54   Ht 6' (1.829 m)   Wt 188 lb (85.3 kg)   BMI 25.50 kg/m   Physical Exam  Constitutional: He is oriented to person, place, and time. He appears well-developed and well-nourished.  HENT:  Head: Normocephalic and atraumatic.  Eyes: Pupils are equal, round, and reactive to light. EOM are normal.  Neck: No tracheal deviation present. No thyromegaly present.  Cardiovascular: Normal rate.   Pulmonary/Chest: Effort normal. He has no wheezes.  Abdominal: Soft. Bowel sounds are normal.  Musculoskeletal:  Well-healed midline left knee incision. Patella tracking is normal. No lateral subluxation. Calcifications seen on x-ray is palpable along the medial capsule adjacent to the patella. There is also some calcifications in the distal quad tendon. Distal pulses are intact no pain with hip range of motion is full extension and a good quad strength.  Neurological: He is alert and oriented to person, place, and time.  Skin: Skin is warm and dry. Capillary refill takes less than 2 seconds.  Psychiatric: He has a normal mood and affect. His behavior is normal. Judgment and thought content normal.    Ortho Exam collateral ligaments and knee  are stable. Reflexes 120 has full extension. Normal patellar tracking. No pitting edema.  Specialty Comments:  No specialty comments available.  Imaging: Xr Knee 3 View Left  Result Date: 01/31/2017 Three-view x-rays left knee obtained. This shows well-positioned cemented total knee arthroplasty. There is some calcification of the distal quad tendon at the patellar insertion. Some medial capsular calcification adjacent to the patella seen on sunrise patellar view. Impression: Left knee arthroplasty without subsidence or loosening. Some calcification of the quad tendon distally and medial  capsule    PMFS History: Patient Active Problem List   Diagnosis Date Noted  . Acute pain of left knee 01/31/2017  . S/P total knee arthroplasty, left 01/31/2017  . Upper respiratory infection, acute 11/27/2016  . Squamous cell skin cancer 03/23/2016  . Closed patellar dislocation 05/14/2013  . Osteoarthritis of left knee 04/30/2013    Class: Diagnosis of  . Calcified granuloma of lung 03/19/2013  . Diverticulosis of colon without hemorrhage 03/19/2013  . Physical exam, annual 03/19/2012  . Kidney stone 03/19/2012  . COLONIC POLYPS 06/28/2008  . Pure hypercholesterolemia 06/28/2008  . SMOKER 06/28/2008  . HEMORRHOIDS 06/28/2008  . Osteoarthritis 06/28/2008  . Melanoma of skin ([place]) 06/28/2008   Past Medical History:  Diagnosis Date  . Acute prostatitis   . Arthritis    hands  . Cancer ([place])    basal cell removed  . Dislocated patella 04/2013   S/P   ARTHROPLASTY  . Hemorrhoids   . History of skin cancer   . Hx of colonic polyps   . Kidney stone    passed stone - no surgery required  . Other and unspecified hyperlipidemia   . Smoker   . Whole blood donor     Family History  Problem Relation Age of Onset  . Colon cancer Maternal Grandfather   . Esophageal cancer Neg Hx   . Rectal cancer Neg Hx   . Stomach cancer Neg Hx     Past Surgical History:  Procedure Laterality Date  . COLONOSCOPY    . EYE SURGERY    . KNEE ARTHROPLASTY Left 04/30/2013   Procedure: COMPUTER ASSISTED TOTAL KNEE ARTHROPLASTY;  Surgeon: Marybelle Killings, MD;  Location: [facility];  Service: Orthopedics;  Laterality: Left;  Left Cemented Total Knee Arthroplasty  . 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 Isaiah Blakes [facility]  . left knee arthroscopy  03/2007  . PATELLAR TENDON REPAIR Left 05/14/2013   Procedure: Left Total Knee Arthroplasty Medial Retinaculum Repair;  Surgeon: Marybelle Killings, MD;  Location: [facility];  Service: Orthopedics;  Laterality: Left;  Left Total Knee Arthroplasty  Medial Retinaculum Repair  . POLYPECTOMY    . skin cancer removed Left 2016   shoulder   . skin cancer removed N/A 02/22/2016   from top of his head.  this was done at [facility]  . TONSILLECTOMY AND ADENOIDECTOMY     Social History   Occupational History  . real estate appraiser [employer]   Social History Main Topics  . Smoking status: Light Tobacco Smoker    Years: 30.00    Types: Cigars  . Smokeless tobacco: Never Used     Comment: cigars once weekly  . Alcohol use 8.4 oz/week    7 Glasses of wine, 7 Cans of beer per week     Comment: daily use  . Drug use: No  . Sexual activity: Not on file

## 2017-02-07 DIAGNOSIS — Z08 Encounter for follow-up examination after completed treatment for malignant neoplasm: Secondary | ICD-10-CM | POA: Diagnosis not present

## 2017-02-07 DIAGNOSIS — Z1283 Encounter for screening for malignant neoplasm of skin: Secondary | ICD-10-CM | POA: Diagnosis not present

## 2017-02-07 DIAGNOSIS — L570 Actinic keratosis: Secondary | ICD-10-CM | POA: Diagnosis not present

## 2017-02-07 DIAGNOSIS — X32XXXD Exposure to sunlight, subsequent encounter: Secondary | ICD-10-CM | POA: Diagnosis not present

## 2017-02-07 DIAGNOSIS — Z85820 Personal history of malignant melanoma of skin: Secondary | ICD-10-CM | POA: Diagnosis not present

## 2017-03-06 DIAGNOSIS — Z6825 Body mass index (BMI) 25.0-25.9, adult: Secondary | ICD-10-CM | POA: Diagnosis not present

## 2017-03-06 DIAGNOSIS — C4362 Malignant melanoma of left upper limb, including shoulder: Secondary | ICD-10-CM | POA: Diagnosis not present

## 2017-03-26 ENCOUNTER — Ambulatory Visit: Payer: Medicare Other | Admitting: Pulmonary Disease

## 2017-03-26 ENCOUNTER — Other Ambulatory Visit (INDEPENDENT_AMBULATORY_CARE_PROVIDER_SITE_OTHER): Payer: Medicare Other

## 2017-03-26 ENCOUNTER — Encounter: Payer: Self-pay | Admitting: Pulmonary Disease

## 2017-03-26 ENCOUNTER — Ambulatory Visit (INDEPENDENT_AMBULATORY_CARE_PROVIDER_SITE_OTHER): Payer: Medicare Other | Admitting: Pulmonary Disease

## 2017-03-26 VITALS — BP 160/90 | HR 47 | Temp 96.8°F | Ht 72.0 in | Wt 189.1 lb

## 2017-03-26 DIAGNOSIS — C439 Malignant melanoma of skin, unspecified: Secondary | ICD-10-CM

## 2017-03-26 DIAGNOSIS — Z0000 Encounter for general adult medical examination without abnormal findings: Secondary | ICD-10-CM | POA: Diagnosis not present

## 2017-03-26 DIAGNOSIS — R030 Elevated blood-pressure reading, without diagnosis of hypertension: Secondary | ICD-10-CM | POA: Diagnosis not present

## 2017-03-26 DIAGNOSIS — M150 Primary generalized (osteo)arthritis: Secondary | ICD-10-CM

## 2017-03-26 DIAGNOSIS — F419 Anxiety disorder, unspecified: Secondary | ICD-10-CM | POA: Diagnosis not present

## 2017-03-26 DIAGNOSIS — N320 Bladder-neck obstruction: Secondary | ICD-10-CM | POA: Diagnosis not present

## 2017-03-26 DIAGNOSIS — N200 Calculus of kidney: Secondary | ICD-10-CM | POA: Diagnosis not present

## 2017-03-26 DIAGNOSIS — J984 Other disorders of lung: Secondary | ICD-10-CM

## 2017-03-26 DIAGNOSIS — E7800 Pure hypercholesterolemia, unspecified: Secondary | ICD-10-CM

## 2017-03-26 DIAGNOSIS — C4492 Squamous cell carcinoma of skin, unspecified: Secondary | ICD-10-CM

## 2017-03-26 DIAGNOSIS — M159 Polyosteoarthritis, unspecified: Secondary | ICD-10-CM

## 2017-03-26 DIAGNOSIS — J8410 Pulmonary fibrosis, unspecified: Secondary | ICD-10-CM

## 2017-03-26 DIAGNOSIS — K5730 Diverticulosis of large intestine without perforation or abscess without bleeding: Secondary | ICD-10-CM | POA: Diagnosis not present

## 2017-03-26 DIAGNOSIS — I159 Secondary hypertension, unspecified: Secondary | ICD-10-CM

## 2017-03-26 DIAGNOSIS — D126 Benign neoplasm of colon, unspecified: Secondary | ICD-10-CM | POA: Diagnosis not present

## 2017-03-26 LAB — LIPID PANEL
CHOL/HDL RATIO: 3
Cholesterol: 172 mg/dL (ref 0–200)
HDL: 63 mg/dL (ref >39.00)
LDL CALC: 95 mg/dL (ref 0–99)
NONHDL: 109.13
Triglycerides: 71 mg/dL (ref 0.0–149.0)
VLDL: 14.2 mg/dL (ref 0.0–40.0)

## 2017-03-26 LAB — CBC WITH DIFFERENTIAL/PLATELET
Basophils Absolute: 0.1 10*3/uL (ref 0.0–0.1)
Basophils Relative: 0.8 % (ref 0.0–3.0)
Eosinophils Absolute: 0.4 10*3/uL (ref 0.0–0.7)
Eosinophils Relative: 5.5 % — ABNORMAL HIGH (ref 0.0–5.0)
HCT: 47.2 % (ref 39.0–52.0)
Hemoglobin: 16.1 g/dL (ref 13.0–17.0)
LYMPHS ABS: 2.3 10*3/uL (ref 0.7–4.0)
Lymphocytes Relative: 29.9 % (ref 12.0–46.0)
MCHC: 34.1 g/dL (ref 30.0–36.0)
MCV: 98.1 fl (ref 78.0–100.0)
MONO ABS: 0.5 10*3/uL (ref 0.1–1.0)
Monocytes Relative: 7.2 % (ref 3.0–12.0)
NEUTROS ABS: 4.3 10*3/uL (ref 1.4–7.7)
NEUTROS PCT: 56.6 % (ref 43.0–77.0)
PLATELETS: 204 10*3/uL (ref 150.0–400.0)
RBC: 4.81 Mil/uL (ref 4.22–5.81)
RDW: 12.6 % (ref 11.5–15.5)
WBC: 7.6 10*3/uL (ref 4.0–10.5)

## 2017-03-26 LAB — COMPREHENSIVE METABOLIC PANEL
ALBUMIN: 4.6 g/dL (ref 3.5–5.2)
ALT: 18 U/L (ref 0–53)
AST: 21 U/L (ref 0–37)
Alkaline Phosphatase: 74 U/L (ref 39–117)
BUN: 21 mg/dL (ref 6–23)
CHLORIDE: 104 meq/L (ref 96–112)
CO2: 28 meq/L (ref 19–32)
CREATININE: 0.9 mg/dL (ref 0.40–1.50)
Calcium: 10.1 mg/dL (ref 8.4–10.5)
GFR: 88.89 mL/min (ref >60.00)
GLUCOSE: 106 mg/dL — AB (ref 70–99)
POTASSIUM: 4.6 meq/L (ref 3.5–5.1)
SODIUM: 141 meq/L (ref 135–145)
Total Bilirubin: 1.1 mg/dL (ref 0.2–1.2)
Total Protein: 7.1 g/dL (ref 6.0–8.3)

## 2017-03-26 LAB — PSA: PSA: 1.61 ng/mL (ref 0.10–4.00)

## 2017-03-26 LAB — TSH: TSH: 0.52 u[IU]/mL (ref 0.35–4.50)

## 2017-03-26 NOTE — Patient Instructions (Signed)
Today we updated your med list in our EPIC system...    Continue your current medications the same...  Your BP today was ~160/90 and we need to know your BP readings at home etc...    Please get a good arm BP cuff for home monitoring & keep a record...    We will also check an Echocardiogram to further assess for any HBP changes that would necessitate medication...  We gave you the 2018 FLU vaccine today  We also did your follow up FASTING blood work...    We will contact you w/ the results when available...   Call for any questions...  Let's plan a follow up visit in 65mo, sooner if needed for problems...    Call with your BP readings if the numbers are regularly 150/90 or higher.Marland KitchenMarland Kitchen

## 2017-03-26 NOTE — Progress Notes (Signed)
Subjective:     Patient ID: Russell [name], male   DOB: 04-12-1946, 71 y.o.   MRN: [id number]  HPI 71 y/o WM here for a follow up visit...   ~  August 22, 2010:  Yearly follow up- doing well overall notes some left knee pain (he works out in gym 5d/wk +golf etc & uses Advil Prn), occas nasal drainage & epistaxis (wife made appt w/ ENT later this week- we discussed saline)...    On Simva40 for Chol & FLP 3/12 shows TChol 157, TG 54, HDL 54, LDL 92> continue same +diet etc...  EKG= SBrady 54/min, WNL.Marland KitchenMarland Kitchen CPX labs all look good (he didn't go for his XRay today)...  ~  March 19, 2012:  19 month ROV & CPX> Russell [name] has had a good interval except for the right kidney stone 7-8/12, treated via ER & was able to pass, then seen by Urology (per pt hx we do not have notes & nothing in Epic), sounds like calc oxalate stones w/ rec for dietary adjust- no colas, tea, etc & increase citrate; he has been doing well since then & no known recurrence...    His only meds are ASA81 and Simva40> tol well & FLP today is good- all parameters at goals, continue same...  His BP is sl elev today at 160/88 but he's had a stressful morn, & BP's at his last two blood donor appts= 110-120/ 60-70's;  He exercises regularly w/o difficulty...    We reviewed prob list, meds, xrays and labs> see below for updates >> he'll get flu vaccine next week...  CXR 10/13 showed normal heart size, clear lungs, tiny granuloma left base, NAD...  LABS 10/13:  FLP- at goals on Simva40;  Chems- wnl;  CBC- wnl;  TSH=0.83;  PSA= 1.23;  Urine- clear  ~  March 19, 2013:  Yearly ROV & Russell [name] is doing well x for some left knee pain & he has appt w/ Ortho next week; a business assoc recently passed from metastatic colon ca; he exercises regularly- 5d per week at sport time... We reviewed the following medical problems during today's office visit >>     Smoker & Abn CXR> he smokes cigars on the golf course & has a sm LLL granuloma on CXR; he remains  asymptomatic w/o cough, sput, SOB, CP, etc...    CHOL> on Simva40; prev LDL as high as 150 range on diet alone; FLP 10/14 shows TChol 149, TG 28, HDL 62, LDL 81    GI- divertics, colon polyps & hems> grandfather had colon cancer; pt noted rectal bleeding 5/14 & saw DrPerry in f/u> colonoscopy done 6/14 w/ divertics, hems, & 4 polyps- tubulovillous adenomas & f/u planned 3-30yrs    GU- hx prostatitis & kid stones> had stone 2012 able to pass it; Urology eval by DrOttelin & diet adjust; no recurrent problem since then...    DJD> he has seen DrYates in the past & had left knee arthroscopy; says he was told bone on bone & would need replacement; he takes 3 Advil prior to golfing...    Hx skin cancer> his friend and business partner died from metastatic melanoma; pt has DrHall check his skin every 65mo...    Blood donor> his has donated over 150 pts he says; continues to donate regularly; labs 10/14 showed Hg= 15.3 We reviewed prob list, meds, xrays and labs> see below for updates >>   CXR 10/14 showed normal heart size, clear lungs, 78mm granuloma LLL, NAD.Marland KitchenMarland Kitchen  EKG-  SBrady, rate54, wnl, NAD...  LABS 10/14:  FLP- at goals on Simva40;  Chems- wnl;  CBC- wnl;  TSH=1.14;  PSA=1.43...   ~  March 19, 2014:  Yearly ROV & review of medical problems> Russell [name] had his left TKR 11/14 by DrYates then had complic w/ patella dislocation requiring a se4cond surg by DrDuda 2 wks later; this was followed by rehab & now back to normal & he is very pleased- fully recovered, playing golf, etc... 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 We reviewed the following medical problems during today's office visit >>     Smoker & Abn CXR> he smokes cigars on the golf course & has a sm LLL granuloma on CXR; he remains asymptomatic w/o cough, sput, SOB, CP, etc...    CHOL> on Simva40; prev LDL as high as 150 range on diet alone; FLP 10/15 shows TChol 150, TG 53, HDL 56, LDL  83    GI- divertics, colon polyps & hems> grandfather had colon cancer; pt noted rectal bleeding 5/14 & saw DrPerry in f/u> colonoscopy done 6/14 w/ divertics, hems, & 4 polyps- tubulovillous adenomas & f/u planned 3-60yrs    GU- hx prostatitis & kid stones> had stone 2012 able to pass it; Urology eval by DrOttelin & diet adjust; no recurrent problem since then...    DJD> he has seen DrYates in the past & had left knee arthroscopy; finally had left TKR 40/10 w/ complic (patella dislocation) requiring surg 2wks later; s/p rehab & back to normal now...    Hx skin cancer> his friend and business partner died from metastatic melanoma; pt has DrHall check his skin every 97mo...    Blood donor> his has donated over 150 pts he says; continues to donate regularly; labs 10/14 showed Hg= 15.3 We reviewed prob list, meds, xrays and labs> see below for updates >> he will get Flu shot at work in 2 wks...   CXR 10/15 showed norm heart size, mild tortuosity of Ao, clear lungs, NAD...  LABS 10/15:  FLP- at goals on Simva40;  Chems- wnl;  CBC- wnl;  TSH=0.85;  PSA=1.73...  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 otherw feeling well- no new complaints or concerns... We reviewed the following medical problems during today's office visit >>     Smoker & Abn CXR> he smokes cigars on the golf course & has a sm LLL granuloma on CXR; he remains asymptomatic w/o cough, sput, SOB, CP, etc...    CHOL> on Simva40; prev LDL as high as 150 range on diet alone; FLP 10/16 shows TChol 159, TG 54, HDL 65, LDL 84    GI- divertics, colon polyps & hems> grandfather had colon cancer; pt noted rectal bleeding 5/14 & saw DrPerry in f/u> colonoscopy done 6/14 w/ divertics, hems, & 4 polyps-  tubulovillous adenomas & f/u planned 3-76yrs    GU- hx prostatitis & kid stones> had stone 2012 able to pass it; Urology eval by DrOttelin & diet adjust; no recurrent problem since then...    DJD> he has seen DrYates in the past & had left knee arthroscopy; finally had left TKR 27/25 w/ complic (patella dislocation) requiring surg 2wks later; s/p rehab &  back to normal now...    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    Blood donor> his has donated over 150 pts he says; continues to donate regularly; labs 10/14 showed Hg= 15.3 EXAM shows Afeb, VSS, O2sat=96% on RA;  Wt=190# stable;  HEENT- neg, mallampati2;  Chest- clear w/o w/r/r;  Heart- RR w/o m/r/g;  Abd- soft, nontender, neg;  Ext- neg w/o c/c/e;  Neuro- intact...   CXR 03/24/15 showed norm heart size, clear lungs, NAD.Marland KitchenMarland Kitchen   EKG 03/24/15 showed SBrady, rate50, WNL, NAD...  LABS 03/2015>  FLP at goals on Simva40;  Chems- wnl;  CBC- wnl;  TSH=1.10;  PSA=1.00  IMP/PLANS>>  Russell [name] was given 2016 Flu vaccine & the Prevnar-13 shot today; he will maintain f/u w/ [facility] surgery; continue same meds...  ~  March 23, 2016:  39yr ROV & Russell [name] returns for a follow up visit, doing satis, no new complaints or concerns; he continues to f/u w/ DERM regularly & recently had a few placed frozen... We reviewed the following medical problems during today's office visit >>     Smoker & Abn CXR> he smokes cigars on the golf course & has a sm LLL granuloma on CXR; he remains asymptomatic w/o cough, sput, SOB, CP, etc...    CHOL> on Simva40; prev LDL as high as 150 range on diet alone; FLP 10/17 shows TChol 164, TG 83, HDL 64, LDL 84    GI- divertics, colon polyps & hems> grandfather had colon cancer; pt noted rectal bleeding 5/14 & saw DrPerry in f/u> colonoscopy done 6/14 w/  divertics, hems, & 4 polyps- tubulovillous adenomas & f/u planned 3-67yrs    GU- hx prostatitis & kid stones> had stone 2012 able to pass it; Urology eval by DrOttelin & diet adjust; no recurrent problem since then...    DJD> he has seen DrYates in the past & had left knee arthroscopy; finally had left TKR 09/32 w/ complic (patella dislocation) requiring surg 2wks later; s/p rehab & back to normal now...    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    Blood donor> his has donated over 150 pts he says; continues to donate regularly; labs 10/14 showed Hg= 15.3 EXAM shows Afeb, VSS, O2sat=96% on RA;  Wt=192# stable;  HEENT- neg, mallampati2;  Chest- clear w/o w/r/r;  Heart- RR w/o m/r/g;  Abd- soft, nontender, neg;  Ext- neg w/o c/c/e;  Neuro- intact...   CXR 03/23/16 showed norm heart size, calcif Ao, clear lungs- NAD...   LABS 03/23/16>  FLP- at goals on Simva40;  Chems- wnl;  CBC- wnl;  TSH=0.78;  PSA=1.45;  VitD=37=> rec 1000u/d supplement... IMP/PLAN>>  Russell [name] is stable- given 2017 flu vaccine today & rec to continue same + diet & exercise...  ~  November 27, 2016:  39mo ROV & pt requested add-on appt for URI as he is flying to [place] tomorrow on [employer] reports 1 wk hx mild sore throat, hoarseness, congestion, and blowing clear mucus (no color, no blood); he notes sl HA & mild cough w/ small amt beige sput;  He is a cigar  smoker (mostly on the golf course) and has hx abn CXR w/ LLL granuloma, otherw clear;  He also had melanoma surg 12/2014 left post shoulder- followed at [facility]...     EXAM shows Afeb, VSS, O2sat=98% on RA;  Wt=190# stable;  HEENT- sl hoarse, mallampati2;  Chest- clear w/o w/r/r;  Heart- RR w/o m/r/g;  Abd- soft, nontender, neg;  Ext- neg w/o c/c/e;  Neuro- intact...  IMP/PLAN>>  We decided to treat w/  ZPak, Depo80, Pred taper (see AVS), and rec OTC meds for comfort including Delsym, throat lozenges etc...    ~  March 26, 2017:  52mo ROV & time for his annual check up>  Russell [name] reports that his URI responded nicely to Rx & he had a great wine trip to [place] & golf trip to [place];  Feeling well w/o new complaints or concerns- he denies CP, palpit, dizzy, SOB, edema, etc;  BP has been elevated on several recent BP checks- 160/90 today, 146/90 by Russell [name] 9/18, 162/98 by Ortho 8/18, etc=> we decided to check 2DEcho for further eval... We reviewed the following medical problems during today's office visit >>     Smoker & Abn CXR> he smokes cigars on the golf course & has a sm LLL granuloma on CXR; he remains asymptomatic w/o cough, sput, SOB, CP, etc...    CHOL> on Simva40; prev LDL as high as 150 range on diet alone; FLP 10/18 shows TChol 172, TG 71, HDL 63, LDL 95    GI- divertics, colon polyps & hems> grandfather had colon cancer; pt noted rectal bleeding 5/14 & saw DrPerry in f/u> colonoscopy done 6/14 w/ divertics, hems, & 4 polyps- tubulovillous adenomas & f/u planned 69yrs; f/u colon done 02/01/16 w/ divertics, hems, no recurrent polyps & f/u planned 5 yrs by DrPerry...    GU- hx prostatitis & kid stones> had stone 2012 able to pass it; Urology eval by DrOttelin & diet adjust; no recurrent problem since then; PSA has been normal...    DJD> he has seen DrYates in the past & had left knee arthroscopy; finally had left TKR 31/49 w/ complic (patella dislocation) requiring surg 2wks later; s/p rehab & back to normal now...    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    Blood donor> his has donated over 150 pts he says; continues to donate but not as regularly; labs 10/18 showed Hg= 16.1 EXAM shows Afeb, Borderline HBP 160/90,  O2sat=99% on RA;  Wt=189# stable;  HEENT- neg, mallampati2;  Chest- clear w/o w/r/r;  Heart- RR w/o m/r/g;  Abd- soft, nontender, neg;  Ext- neg w/o c/c/e;  Neuro- intact...   LABS 03/26/17>  FLP- wnl, all parameters are at goals on Simva40;  Chems- wnl x FBS=106;  CBC- wnl;  TSH=0.52;  PSA=1.61  2DEcho 03/30/17>  Mild LVH w/ norm LVF, EF=60-65%, no regional wall motion abn, Gr1DD, trivAI, sl bowing of MV w/o prolapse & mild MR, mild LAdil at 70mm... IMP/PLAN>>  He has borderline/ mild HBP & 2DEcho shows mild LVH=> rec starting a step one antihypertensive w/ LOSARTAN 50mg  po Qd=> called to [employer];  He is asked to get a BP cuff & start monitoring BP at home- call for any issues;  OK 2018 Flu vaccine today;  His only other prescription med is Simva40 &  FLP looks good- continue same...          Problem List:   SMOKER (ICD-305.1) - he is a cigar smoker and states that he does not inhale... smokers only occas "when I play golf or go to the bar"... ~  CXR 3/12 showed norm heart size, mild interstitial thickening, 84mm calc granuloma left lung base. ~  CTAbd 7/12 in ER for kidney stone showed incidental mild bullous changes at lung bases & bibasilar atx; also atherosclerotic changes in Ao & branches w/o aneurysm.  ~  CXR 10/13 showed tiny calcif granuloma at left lung base, norm heart size, otherw clear lungs... ~  CXR 10/14 showed normal heart size, clear lungs, 75mm granuloma LLL, NAD ~  CXR 10/15 showed norm heart size, mild tortuosity of Ao, clear lungs, NAD ~  CXR 10/16 showed norm heart size, clear lungs, NAD.Marland Kitchen.  ~  CXR 03/23/16 showed norm heart size, calcif Ao, clear lungs- NAD...   BORDERLINE HBP/ Mild LVH on 2DEchocardiogram 03/2017 => BP levated here & at specialist offices over the last yr => check 2DEcho ~  2DEcho 03/30/17 showed mild LVH w/ norm LVF, EF=60-65%, no regional wall motion abn, Gr1DD, trivAI, sl bowing of MV w/o prolapse & mild MR, mild LAdil at 49mm... ~  Started on  LOSARTAN50=>   HYPERCHOLESTEROLEMIA, BORDERLINE (ICD-272.4) - on diet Rx alone & prev not interested in med Rx... now he states he will consider medication if no better on his diet... ~  [place] 11/06 showed TChol 210, Tg 41, HDL 59, LDL 138... pt refers diet Rx. ~  [place] 12/09 showed TChol 219, Tg 43, HDL 53, LDL 141... rec> start Simva but he refuses, prefers diet. ~  FLP 12/10 showed TChol  222, TG 111, HDL 53, LDL 153... rec> start Simva40. ~  [place] 3/11 on Simva40 showed TChol 141, TG 36, HDL 59, LDL 75... continue Simva40. ~  [place] 3/12 on Simva40 showed TChol 157, TG 54, HDL 54, LDL 92 ~  FLP 10/13 on Simva40 showed TChol 163, TG 35, HDL 73, LDL 83 ~  FLP 10/14 on Simva40 showed TChol 149, TG 28, HDL 62, LDL 81  ~  FLP 10/15 on Simva40 showed TChol 150, TG 53, HDL 56, LDL 83 ~  FLP 10/16 on Simva40 showed TChol 159, TG 54, HDL 65, LDL 84 ~  FLP 10/17 on Simva40 showed  TChol 164, TG 83, HDL 64, LDL 84 ~  FLP 10/18 on simva40 showed  TChol 172, TG 71, HDL 63, LDL 95  Hx of COLONIC POLYPS (ICD-211.3), & HEMORRHOIDS (ICD-455.6) - + FamHx colon cancer in his grandfather... last colonoscopy 2/08 by DrPerry showed 54mm polyp & hems... path= polypoid mucosa w/o adenomatous change... f/u planned 16yrs. ~  grandfather had colon cancer; pt noted rectal bleeding 5/14 & saw DrPerry in f/u> colonoscopy done 6/14 w/ divertics, hems, & 4 polyps- tubulovillous adenomas & f/u planned 63yrs ~  Follow up colonoscopy done 02/01/16 w/ divertics, hems, no recurrent polyps & f/u planned 5 yrs by DrPerry  Hx of ACUTE PROSTATITIS (ICD-601.0) - prev eval & Rx by DrWrenn... no symptoms... ~  PSA readings have all been wnl  KIDNEY STONES >>  ~  New onset problem 7/12 & went to ER for treatment; Rx conservatively & he was able to pass stone; followed up w/ Urology, DrOttelin (we don't have notes) & he reports stone was calc oxalate & rec to avoid colas, tea, etc; increase water intake & use citrate  when poss... ~  CTAbd 7/12  showed mild bullous changes at lung bases & atx, +gallstones w/o inflamm, mild right hydronephrosis & hydroureter/ 51mm right UVJ stone; mild prostatic enlargement, multilevel DDD in spine, ateriosclerotic calcif in Ao & branches... ~  XRay Abd in ER 8/12 showed mod stool burden, mild lumbar spondylosis, NAD... ~  No known recurrence of stone disease to date...  DEGENERATIVE JOINT DISEASE (ICD-715.90) - he had a prev left knee arthroscopy by DrYates in 2008... he take 3 ADVIL prior to golfing... ~  10/14: states he was prev told bone on bone in knee & he'd need TKR per DrYates=> done 78/93 w/ complic (patella dislocation) requiring surg 2wks later; s/p rehab & back to normal now...  SKIN CANCER, HX OF (ICD-V10.83) - he sees DrHall for skin check every 6 months... 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 ~  [id number]:  DrHall found MELANOMA on left post shoulder- referred to Surg Oncology DrOllila at [facility] for wider excision, mapping, & sentinel node Bx- margins clear & LN was neg; they continue to follow...  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 ~  labs 12/09 showed Hg= 15.7 ~  labs 12/10 showed Hg= 16.6 ~  Labs 3/12 showed Hg= 16.1 ~  Labs 10/13 showed Hg= 15.0 ~  Labs 10/14 showed Hg= 15.3 ~  Labs 10/15 showed Hg= 15.5 ~  Labs 10/16 showed Hg= 15.9 ~  Labs 10/17 showed Hg= 15.3  HEALTH MAINTENANCE:  he takes ASA 81mg  on MWF since he has epistaxis on daily ASA early in 2009... ~  GI:  DrPerry & up to date on screening colonoscopy... ~  GU:  DrWrenn in the past... PSA 10/14 = 1.43 ~  Immuniz:  He is up to date on all indicated vaccinations...   Past Medical History:  Diagnosis Date  . Acute prostatitis   . Arthritis    hands  . Cancer ([place])    basal cell removed  . Dislocated patella 04/2013   S/P   ARTHROPLASTY  . Hemorrhoids   . History of skin cancer   . Hx of colonic polyps   . Kidney  stone    passed stone - no surgery required  . Other and unspecified hyperlipidemia   . Smoker   . Whole blood donor     Past Surgical History:  Procedure Laterality Date  . COLONOSCOPY    . EYE SURGERY    . KNEE ARTHROPLASTY Left 04/30/2013   Procedure: COMPUTER ASSISTED TOTAL KNEE ARTHROPLASTY;  Surgeon: Marybelle Killings, MD;  Location: [place];  Service: Orthopedics;  Laterality: Left;  Left Cemented Total Knee Arthroplasty  . 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 Isaiah Blakes at [facility]  . left knee arthroscopy  03/2007  . PATELLAR TENDON REPAIR Left 05/14/2013   Procedure: Left Total Knee Arthroplasty Medial Retinaculum Repair;  Surgeon: Marybelle Killings, MD;  Location: [place];  Service: Orthopedics;  Laterality: Left;  Left Total Knee Arthroplasty Medial Retinaculum Repair  . POLYPECTOMY    . skin cancer removed Left 2016   shoulder   . skin cancer removed N/A 02/22/2016   from top of his head.  this was done at [facility]  . TONSILLECTOMY AND ADENOIDECTOMY      Outpatient Encounter Prescriptions as of 11/27/2016  Medication Sig  . aspirin 81 MG tablet 1 tablet by mouth three times  per week.  . Multiple Vitamins-Minerals (MULTIVITAMIN ADULTS 50+ PO) Take 1 tablet by mouth daily.  . simvastatin (ZOCOR) 40 MG tablet TAKE ONE TABLET EVERY EVENING  . azithromycin (ZITHROMAX) 250 MG tablet Take as directed per package  . predniSONE (DELTASONE) 20 MG tablet 1 tab two times daily x 3 days, 1 tab daily x 3 days, 1/2 tab daily until gone    No Known Allergies   Immunization History  Administered Date(s) Administered  . Influenza Whole 02/25/2010, 02/18/2012  . Influenza, High Dose Seasonal PF 04/01/2014, 03/23/2016  . Influenza,inj,Quad PF,6+ Mos 03/24/2015  . Influenza-Unspecified 02/17/2013  . Pneumococcal Polysaccharide-23 03/19/2013    Current Medications, Allergies, Past Medical History, Past Surgical History, Family History, and Social History were reviewed in [employer] record.   Review of Systems         The patient complains of joint pain and arthritis.  The patient denies fever, chills, sweats, anorexia, fatigue, weakness, malaise, weight loss, sleep disorder, blurring, diplopia, eye irritation, eye discharge, vision loss, eye pain, photophobia, earache, ear discharge, tinnitus, decreased hearing, nasal congestion, nosebleeds, sore throat, hoarseness, chest pain, palpitations, syncope, dyspnea on exertion, orthopnea, PND, peripheral edema, cough, dyspnea at rest, excessive sputum, hemoptysis, wheezing, pleurisy, nausea, vomiting, diarrhea, constipation, change in bowel habits, abdominal pain, melena, hematochezia, jaundice, gas/bloating, indigestion/heartburn, dysphagia, odynophagia, dysuria, hematuria, urinary frequency, urinary hesitancy, nocturia, incontinence, back pain, joint swelling, muscle cramps, muscle weakness, stiffness, sciatica, restless legs, leg pain at night, leg pain with exertion, rash, itching, dryness, suspicious lesions, paralysis, paresthesias, seizures, tremors, vertigo, transient blindness, frequent falls, frequent headaches, difficulty walking, depression, anxiety, memory loss, confusion, cold intolerance, heat intolerance, polydipsia, polyphagia, polyuria, unusual weight change, abnormal bruising, bleeding, enlarged lymph nodes, urticaria, allergic rash, hay fever, and recurrent infections.     Objective:   Physical Exam    WD, WN, 71 y/o WM in NAD...  GENERAL:  Alert & oriented; pleasant & cooperative... HEENT:  [place]/AT, EOM-wnl, PERRLA, EACs-clear, TMs-wnl, NOSE-clear, THROAT-clear & wnl. NECK:  Supple w/ full ROM; no JVD; normal carotid impulses w/o bruits; no thyromegaly or nodules palpated; no lymphadenopathy. CHEST:  Clear to P & A; without wheezes/ rales/ or rhonchi. HEART:  Regular Rhythm; without murmurs/ rubs/ or gallops. ABDOMEN:  Soft & nontender; normal bowel sounds; no organomegaly or masses  detected. RECTAL:  Neg - prostate 2+ & nontender w/o nodules; stool hematest neg. EXT: without deformities, mild arthritic changes; no varicose veins/ venous insuffic/ or edema. NEURO:  CN's intact; motor testing normal; sensory testing normal; gait normal & balance OK. DERM:  No lesions noted; no rash etc...  RADIOLOGY DATA:  Reviewed in the EPIC EMR & discussed w/ the patient...  LABORATORY DATA:  Reviewed in the EPIC EMR & discussed w/ the patient...   Assessment:      11/27/16>  Russell [name] presents w/ URI, HA, sinus drainage, hoarseness, clear sput- we decided to treat w/ ZPak, Depo80, Pred taper (see AVS), and rec OTC meds for comfort including Delsym, throat lozenges etc...  03/26/17>  He has borderline/ mild HBP & 2DEcho shows mild LVH=> rec starting a step one antihypertensive w/ LOSARTAN 50mg  po Qd=> called to [employer] [facility];  He is asked to get a BP cuff & start monitoring BP at home- call for any issues;  OK 2018 Flu vaccine today;  His only other prescription med is [place] looks good- continue same.   Cigar SMOKER>  CXR is stable, NAD; 50mm calcif granuloma noted in  LLL; CTAbd in 2012 showed incidental bullous changes at lung bases; must quit all smoking...  CHOL>  FLP looks good on Simva40; continue same + diet...  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  Hx Kid Stones & Prostatitis in past>  No recurrent kid stones or prostate symptoms; PSA= 1.43  DJD>  Followed by Avel Peace; s/p left TKR & doing well...  Skin Ca>  Followed by Natale Milch & pt reports doing OK...  ~  [id number]: Dx w/ melanoma left post shoulder area- removed by [facility], referred to [place] at [facility] for wide excision, mapping, & sentine node bx=> all neg & they continue to follow.  Whole Blood Donor>  A+ blood type & has given ~150 pints to date...     Plan:     Patient's Medications  New  Prescriptions   No medications on file  Previous Medications   ASPIRIN 81 MG TABLET    1 tablet by mouth three times per week.   MULTIPLE VITAMINS-MINERALS (MULTIVITAMIN ADULTS 50+ PO)    Take 1 tablet by mouth daily.   SIMVASTATIN (ZOCOR) 40 MG TABLET    TAKE ONE TABLET EVERY EVENING  Modified Medications   No medications on file  Discontinued Medications   AZITHROMYCIN (ZITHROMAX) 250 MG TABLET    Take as directed per package   PREDNISONE (DELTASONE) 20 MG TABLET    1 tab two times daily x 3 days, 1 tab daily x 3 days, 1/2 tab daily until gone

## 2017-03-30 ENCOUNTER — Ambulatory Visit (HOSPITAL_COMMUNITY): Payer: Medicare Other | Attending: Internal Medicine

## 2017-03-30 ENCOUNTER — Other Ambulatory Visit: Payer: Self-pay

## 2017-03-30 DIAGNOSIS — I159 Secondary hypertension, unspecified: Secondary | ICD-10-CM

## 2017-03-30 DIAGNOSIS — I340 Nonrheumatic mitral (valve) insufficiency: Secondary | ICD-10-CM | POA: Diagnosis not present

## 2017-03-30 DIAGNOSIS — F17290 Nicotine dependence, other tobacco product, uncomplicated: Secondary | ICD-10-CM | POA: Diagnosis not present

## 2017-03-30 DIAGNOSIS — E785 Hyperlipidemia, unspecified: Secondary | ICD-10-CM | POA: Diagnosis not present

## 2017-04-24 ENCOUNTER — Other Ambulatory Visit: Payer: Self-pay | Admitting: Pulmonary Disease

## 2017-04-27 DIAGNOSIS — L570 Actinic keratosis: Secondary | ICD-10-CM | POA: Diagnosis not present

## 2017-04-27 DIAGNOSIS — Z1283 Encounter for screening for malignant neoplasm of skin: Secondary | ICD-10-CM | POA: Diagnosis not present

## 2017-04-27 DIAGNOSIS — C44619 Basal cell carcinoma of skin of left upper limb, including shoulder: Secondary | ICD-10-CM | POA: Diagnosis not present

## 2017-04-27 DIAGNOSIS — C44319 Basal cell carcinoma of skin of other parts of face: Secondary | ICD-10-CM | POA: Diagnosis not present

## 2017-04-27 DIAGNOSIS — X32XXXD Exposure to sunlight, subsequent encounter: Secondary | ICD-10-CM | POA: Diagnosis not present

## 2017-05-06 ENCOUNTER — Encounter: Payer: Self-pay | Admitting: Pulmonary Disease

## 2017-05-07 NOTE — Telephone Encounter (Signed)
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 I did get a cuff for home ([employer] brand) and have been checking my readings for a month. Here's a bunch of them by date:    Oct. 16  144/94 6:00 p.m.  Oct. 17  129/88 5:30 a.m.  Oct. 20  128/91  8:30 a.m.  Oct. 21  121/81  8:40 a.m.  Oct. 24  152/95  6:45 p.m.  Oct. 28  158/107 6:00 p.m.  Oct. 29  155/96  6:00 p.m.  Oct. 30  134/89  5:35 a.m.  Nov. 1   121/85  5:30 a.m.  Nov. 5  159/91  5:45 p.m.  Nov. 6   111/84  5:30 a.m.  Nov. 9   107/80  5:30 a.m.  Nov. 9   143/94  9:50 p.m.  Nov. 11  132/94  8:45 a.m.  Nov. 14  154/93  7:25 p.m.  Nov. 17  141/92  2:15 p.m.    Pulse readings are all between 51 to 63 with most in the mid 50's. It seems clear to me that I should only check my readings in the morning!    Thanks for taking a look!  Russell [name]

## 2017-05-15 ENCOUNTER — Encounter: Payer: Self-pay | Admitting: Pulmonary Disease

## 2017-05-15 MED ORDER — LOSARTAN POTASSIUM 50 MG PO TABS: 50.0000 mg | ORAL_TABLET | Freq: Two times a day (BID) | ORAL | 1 refills | Status: DC

## 2017-05-15 NOTE — Telephone Encounter (Signed)
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    Thanks,  [name]     SN please advise on the readings of the BP above from the pt.  Thanks

## 2017-05-15 NOTE — Addendum Note (Signed)
Addended by: Lorane Gell on: 05/15/2017 04:49 PM   Modules accepted: Orders

## 2017-06-02 ENCOUNTER — Encounter: Payer: Self-pay | Admitting: Pulmonary Disease

## 2017-06-04 MED ORDER — AMLODIPINE BESYLATE 5 MG PO TABS: 5.0000 mg | ORAL_TABLET | Freq: Every day | ORAL | 1 refills | Status: DC

## 2017-06-04 NOTE — Telephone Encounter (Signed)
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 Thanks!   SN please advise. Thanks.

## 2017-06-04 NOTE — Telephone Encounter (Signed)
Per SN: Looks like we may have under estimated your HBP and need to treat with combination therapy. Lets have you take Losartan 100mg  (use 2 of the 50mg  tablets at home) once daily (every AM) and then add Norvasc 5mg  (generic) take 1 tablet QD( every evening) and schedule appointment in Jan 2019 with all BP readings in hand.

## 2017-07-02 ENCOUNTER — Encounter: Payer: Self-pay | Admitting: Pulmonary Disease

## 2017-07-02 ENCOUNTER — Ambulatory Visit (INDEPENDENT_AMBULATORY_CARE_PROVIDER_SITE_OTHER): Payer: Medicare Other | Admitting: Pulmonary Disease

## 2017-07-02 DIAGNOSIS — I10 Essential (primary) hypertension: Secondary | ICD-10-CM

## 2017-07-02 MED ORDER — LOSARTAN POTASSIUM-HCTZ 100-25 MG PO TABS: 1.0000 | ORAL_TABLET | Freq: Every day | ORAL | 11 refills | Status: DC

## 2017-07-02 NOTE — Patient Instructions (Signed)
Today we updated your med list in our EPIC system...     We decided to change your LOSARTAN (Cozaar) to Losartan HCT (Hyzaar)...    We will call in the 100/ 12.5mg  tabs and take one tab each AM...  Continue the Amlodipine 5mg  one tab daily...  Continue the low salt diet...  And continue monitoring your BP at home...  Call for any questions & let me know how your readings are doing...  Let's plan a follow up visit in 67months.Marland KitchenMarland Kitchen

## 2017-07-02 NOTE — Progress Notes (Signed)
Subjective:     Patient ID: Russell [name], male   DOB: [birth year], 72 y.o.   MRN: [id number]  HPI 72 y/o WM here for a follow up visit...   ~  August 22, 2010:  Yearly follow up- doing well overall notes some left knee pain (he works out in gym 5d/wk +golf etc & uses Advil Prn), occas nasal drainage & epistaxis (wife made appt w/ ENT later this week- we discussed saline)...    On Simva40 for Chol & FLP 3/12 shows TChol 157, TG 54, HDL 54, LDL 92> continue same +diet etc...  EKG= SBrady 54/min, WNL.Marland KitchenMarland Kitchen CPX labs all look good (he didn't go for his XRay today)...  ~  March 19, 2012:  19 month ROV & CPX> Russell [name] has had a good interval except for the right kidney stone 7-8/12, treated via ER & was able to pass, then seen by Urology (per pt hx we do not have notes & nothing in Epic), sounds like calc oxalate stones w/ rec for dietary adjust- no colas, tea, etc & increase citrate; he has been doing well since then & no known recurrence...    His only meds are ASA81 and Simva40> tol well & FLP today is good- all parameters at goals, continue same...  His BP is sl elev today at 160/88 but he's had a stressful morn, & BP's at his last two blood donor appts= 110-120/ 60-70's;  He exercises regularly w/o difficulty...    We reviewed prob list, meds, xrays and labs> see below for updates >> he'll get flu vaccine next week...  CXR 10/13 showed normal heart size, clear lungs, tiny granuloma left base, NAD...  LABS 10/13:  FLP- at goals on Simva40;  Chems- wnl;  CBC- wnl;  TSH=0.83;  PSA= 1.23;  Urine- clear  ~  March 19, 2013:  Yearly ROV & Russell [name] is doing well x for some left knee pain & he has appt w/ Ortho next week; a business assoc recently passed from metastatic colon ca; he exercises regularly- 5d per week at sport time... We reviewed the following medical problems during today's office visit >>     Smoker & Abn CXR> he smokes cigars on the golf course & has a sm LLL granuloma on CXR; he remains  asymptomatic w/o cough, sput, SOB, CP, etc...    CHOL> on Simva40; prev LDL as high as 150 range on diet alone; FLP 10/14 shows TChol 149, TG 28, HDL 62, LDL 81    GI- divertics, colon polyps & hems> grandfather had colon cancer; pt noted rectal bleeding 5/14 & saw Russell [name] in f/u> colonoscopy done 6/14 w/ divertics, hems, & 4 polyps- tubulovillous adenomas & f/u planned 3-58yrs    GU- hx prostatitis & kid stones> had stone 2012 able to pass it; Urology eval by Russell [name] & diet adjust; no recurrent problem since then...    DJD> he has seen Russell [name] in the past & had left knee arthroscopy; says he was told bone on bone & would need replacement; he takes 3 Advil prior to golfing...    Hx skin cancer> his friend and business partner died from metastatic melanoma; pt has Russell [name] check his skin every 61mo...    Blood donor> his has donated over 150 pts he says; continues to donate regularly; labs 10/14 showed Hg= 15.3 We reviewed prob list, meds, xrays and labs> see below for updates >>   CXR 10/14 showed normal heart size, clear lungs, 29mm granuloma LLL, NAD.Marland KitchenMarland Kitchen  EKG-  SBrady, rate54, wnl, NAD...  LABS 10/14:  FLP- at goals on Simva40;  Chems- wnl;  CBC- wnl;  TSH=1.14;  PSA=1.43...   ~  March 19, 2014:  Yearly ROV & review of medical problems> Russell [name] had his left TKR 11/14 by Russell [name] then had complic w/ patella dislocation requiring a se4cond surg by Russell [name] 2 wks later; this was followed by rehab & now back to normal & he is very pleased- fully recovered, playing golf, etc... 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 We reviewed the following medical problems during today's office visit >>     Smoker & Abn CXR> he smokes cigars on the golf course & has a sm LLL granuloma on CXR; he remains asymptomatic w/o cough, sput, SOB, CP, etc...    CHOL> on Simva40; prev LDL as high as 150 range on diet alone; FLP 10/15 shows TChol 150, TG 53, HDL 56, LDL  83    GI- divertics, colon polyps & hems> grandfather had colon cancer; pt noted rectal bleeding 5/14 & saw Russell [name] in f/u> colonoscopy done 6/14 w/ divertics, hems, & 4 polyps- tubulovillous adenomas & f/u planned 3-60yrs    GU- hx prostatitis & kid stones> had stone 2012 able to pass it; Urology eval by Russell [name] & diet adjust; no recurrent problem since then...    DJD> he has seen Russell [name] in the past & had left knee arthroscopy; finally had left TKR 40/10 w/ complic (patella dislocation) requiring surg 2wks later; s/p rehab & back to normal now...    Hx skin cancer> his friend and business partner died from metastatic melanoma; pt has Russell [name] check his skin every 97mo...    Blood donor> his has donated over 150 pts he says; continues to donate regularly; labs 10/14 showed Hg= 15.3 We reviewed prob list, meds, xrays and labs> see below for updates >> he will get Flu shot at work in 2 wks...   CXR 10/15 showed norm heart size, mild tortuosity of Ao, clear lungs, NAD...  LABS 10/15:  FLP- at goals on Simva40;  Chems- wnl;  CBC- wnl;  TSH=0.85;  PSA=1.73...  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 otherw feeling well- no new complaints or concerns... We reviewed the following medical problems during today's office visit >>     Smoker & Abn CXR> he smokes cigars on the golf course & has a sm LLL granuloma on CXR; he remains asymptomatic w/o cough, sput, SOB, CP, etc...    CHOL> on Simva40; prev LDL as high as 150 range on diet alone; FLP 10/16 shows TChol 159, TG 54, HDL 65, LDL 84    GI- divertics, colon polyps & hems> grandfather had colon cancer; pt noted rectal bleeding 5/14 & saw Russell [name] in f/u> colonoscopy done 6/14 w/ divertics, hems, & 4 polyps-  tubulovillous adenomas & f/u planned 3-76yrs    GU- hx prostatitis & kid stones> had stone 2012 able to pass it; Urology eval by Russell [name] & diet adjust; no recurrent problem since then...    DJD> he has seen Russell [name] in the past & had left knee arthroscopy; finally had left TKR 27/25 w/ complic (patella dislocation) requiring surg 2wks later; s/p rehab &  back to normal now...    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    Blood donor> his has donated over 150 pts he says; continues to donate regularly; labs 10/14 showed Hg= 15.3 EXAM shows Afeb, VSS, O2sat=96% on RA;  Wt=190# stable;  HEENT- neg, mallampati2;  Chest- clear w/o w/r/r;  Heart- RR w/o m/r/g;  Abd- soft, nontender, neg;  Ext- neg w/o c/c/e;  Neuro- intact...   CXR 03/24/15 showed norm heart size, clear lungs, NAD.Marland KitchenMarland Kitchen   EKG 03/24/15 showed SBrady, rate50, WNL, NAD...  LABS 03/2015>  FLP at goals on Simva40;  Chems- wnl;  CBC- wnl;  TSH=1.10;  PSA=1.00  IMP/PLANS>>  Kaysen was given 2016 Flu vaccine & the Prevnar-13 shot today; he will maintain f/u w/ [facility] surgery; continue same meds...  ~  March 23, 2016:  39yr ROV & Russell [name] returns for a follow up visit, doing satis, no new complaints or concerns; he continues to f/u w/ DERM regularly & recently had a few placed frozen... We reviewed the following medical problems during today's office visit >>     Smoker & Abn CXR> he smokes cigars on the golf course & has a sm LLL granuloma on CXR; he remains asymptomatic w/o cough, sput, SOB, CP, etc...    CHOL> on Simva40; prev LDL as high as 150 range on diet alone; FLP 10/17 shows TChol 164, TG 83, HDL 64, LDL 84    GI- divertics, colon polyps & hems> grandfather had colon cancer; pt noted rectal bleeding 5/14 & saw Russell [name] in f/u> colonoscopy done 6/14 w/  divertics, hems, & 4 polyps- tubulovillous adenomas & f/u planned 3-67yrs    GU- hx prostatitis & kid stones> had stone 2012 able to pass it; Urology eval by Russell [name] & diet adjust; no recurrent problem since then...    DJD> he has seen Russell [name] in the past & had left knee arthroscopy; finally had left TKR 09/32 w/ complic (patella dislocation) requiring surg 2wks later; s/p rehab & back to normal now...    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    Blood donor> his has donated over 150 pts he says; continues to donate regularly; labs 10/14 showed Hg= 15.3 EXAM shows Afeb, VSS, O2sat=96% on RA;  Wt=192# stable;  HEENT- neg, mallampati2;  Chest- clear w/o w/r/r;  Heart- RR w/o m/r/g;  Abd- soft, nontender, neg;  Ext- neg w/o c/c/e;  Neuro- intact...   CXR 03/23/16 showed norm heart size, calcif Ao, clear lungs- NAD...   LABS 03/23/16>  FLP- at goals on Simva40;  Chems- wnl;  CBC- wnl;  TSH=0.78;  PSA=1.45;  VitD=37=> rec 1000u/d supplement... IMP/PLAN>>  Russell [name] is stable- given 2017 flu vaccine today & rec to continue same + diet & exercise...  ~  November 27, 2016:  39mo ROV & pt requested add-on appt for URI as he is flying to [place] tomorrow on [employer] reports 1 wk hx mild sore throat, hoarseness, congestion, and blowing clear mucus (no color, no blood); he notes sl HA & mild cough w/ small amt beige sput;  He is a cigar  smoker (mostly on the golf course) and has hx abn CXR w/ LLL granuloma, otherw clear;  He also had melanoma surg 12/2014 left post shoulder- followed at [facility]...     EXAM shows Afeb, VSS, O2sat=98% on RA;  Wt=190# stable;  HEENT- sl hoarse, mallampati2;  Chest- clear w/o w/r/r;  Heart- RR w/o m/r/g;  Abd- soft, nontender, neg;  Ext- neg w/o c/c/e;  Neuro- intact...  IMP/PLAN>>  We decided to treat w/  ZPak, Depo80, Pred taper (see AVS), and rec OTC meds for comfort including Delsym, throat lozenges etc...   ~  March 26, 2017:  77mo ROV & time for his annual check up>  Russell [name] reports that his URI responded nicely to Rx & he had a great wine trip to [place] & golf trip to [place];  Feeling well w/o new complaints or concerns- he denies CP, palpit, dizzy, SOB, edema, etc;  BP has been elevated on several recent BP checks- 160/90 today, 146/90 by Russell [name] 9/18, 162/98 by Ortho 8/18, etc=> we decided to check 2DEcho for further eval... We reviewed the following medical problems during today's office visit >>     Smoker & Abn CXR> he smokes cigars on the golf course & has a sm LLL granuloma on CXR; he remains asymptomatic w/o cough, sput, SOB, CP, etc...    CHOL> on Simva40; prev LDL as high as 150 range on diet alone; FLP 10/18 shows TChol 172, TG 71, HDL 63, LDL 95    GI- divertics, colon polyps & hems> grandfather had colon cancer; pt noted rectal bleeding 5/14 & saw Russell [name] in f/u> colonoscopy done 6/14 w/ divertics, hems, & 4 polyps- tubulovillous adenomas & f/u planned 24yrs; f/u colon done 02/01/16 w/ divertics, hems, no recurrent polyps & f/u planned 5 yrs by Russell [name]...    GU- hx prostatitis & kid stones> had stone 2012 able to pass it; Urology eval by Russell [name] & diet adjust; no recurrent problem since then; PSA has been normal...    DJD> he has seen Russell [name] in the past & had left knee arthroscopy; finally had left TKR 54/65 w/ complic (patella dislocation) requiring surg 2wks later; s/p rehab & back to normal now...    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    Blood donor> his has donated over 150 pts he says; continues to donate but not as regularly; labs 10/18 showed Hg= 16.1 EXAM shows Afeb, Borderline HBP 160/90,  O2sat=99% on RA;  Wt=189# stable;  HEENT- neg, mallampati2;  Chest- clear w/o w/r/r;  Heart- RR w/o m/r/g;  Abd- soft, nontender, neg;  Ext- neg w/o c/c/e;  Neuro- intact...   LABS 03/26/17>  FLP- wnl, all parameters are at goals on Simva40;  Chems- wnl x FBS=106;  CBC- wnl;  TSH=0.52;  PSA=1.61  2DEcho 03/30/17>  Mild LVH w/ norm LVF, EF=60-65%, no regional wall motion abn, Gr1DD, trivAI, sl bowing of MV w/o prolapse & mild MR, mild LAdil at 50mm... IMP/PLAN>>  He has borderline/ mild HBP & 2DEcho shows mild LVH=> rec starting a step one antihypertensive w/ LOSARTAN 50mg  po Qd=> called to [employer];  He is asked to get a BP cuff & start monitoring BP at home- call for any issues;  OK 2018 Flu vaccine today;  His only other prescription med is [place]  looks good- continue same...   ~  July 02, 2017:  85mo ROV & f/u HBP> when last seen in Oct2018 we started Losartan50 for HBP & mild LVH found on his 2DEcho;  He called in Dec w/ BP readings that were still up to 170/100 intermittently on his home checks so we incr the Losartan to 100mg AM & added Amlodipine5PM;  Since then BP readings are improved (160/90 at high in PM readings, AM BPs look pretty good) but not quite where we want them => so we decided to change Losartan to HYZAAR 100-12.5 today... otherw feeling well-- denies HA, visual changes, CP, palpit, dizzy, edema, etc...     We reviewed LABS from 03/2017 and the 2DEcho dated 03/30/17...  EXAM shows Afeb, BP= 140/82, O2sat=96% on RA;  Wt=195# stable;  HEENT- neg, mallampati2;  Chest- clear w/o w/r/r;  Heart- RR w/o m/r/g;  Abd- soft, nontender, neg;  Ext- neg w/o c/c/e;  Neuro- intact...  IMP/PLAN>>  Russell [name]'s BP is still sl elev by his home BP checks esp in the PM readings & we decided to change his Losa100 to HYZAAR 100-12.5 Qam, continue the Amlod5 Qpm & he will cont to monitor BPs at home, call w/ any questions, and f/u 43mo...          Problem List:   SMOKER (ICD-305.1) - he is a  cigar smoker and states that he does not inhale... smokers only occas "when I play golf or go to the bar"... ~  CXR 3/12 showed norm heart size, mild interstitial thickening, 73mm calc granuloma left lung base. ~  CTAbd 7/12 in ER for kidney stone showed incidental mild bullous changes at lung bases & bibasilar atx; also atherosclerotic changes in Ao & branches w/o aneurysm.  ~  CXR 10/13 showed tiny calcif granuloma at left lung base, norm heart size, otherw clear lungs... ~  CXR 10/14 showed normal heart size, clear lungs, 40mm granuloma LLL, NAD ~  CXR 10/15 showed norm heart size, mild tortuosity of Ao, clear lungs, NAD ~  CXR 10/16 showed norm heart size, clear lungs, NAD.Marland Kitchen.  ~  CXR 03/23/16 showed norm heart size, calcif Ao, clear lungs- NAD...   HBP/ Mild LVH on 2DEchocardiogram 03/2017 => BP levated here & at specialist offices over the last yr => check 2DEcho ~  2DEcho 03/30/17 showed mild LVH w/ norm LVF, EF=60-65%, no regional wall motion abn, Gr1DD, trivAI, sl bowing of MV w/o prolapse & mild MR, mild LAdil at 71mm... ~  Started on LOSARTAN50=> control was not adeq so dose incr to 100mg  & Amlod5 added... ~  07/02/17> Russell [name]'s BP is still sl elev by his home BP checks esp in the PM readings & we decided to change his Losa100 to HYZAAR 100-12.5 Qam, continue the Amlod5 Qpm & he will cont to monitor BPs at home, call w/ any questions, and f/u 40mo.  HYPERCHOLESTEROLEMIA, BORDERLINE (ICD-272.4) - on diet Rx alone & prev not interested in med Rx... now he states he will consider medication if no better on his diet... ~  [place] 11/06 showed TChol 210, Tg 41, HDL 59, LDL 138... pt refers diet Rx. ~  [place] 12/09 showed TChol 219, Tg 43, HDL 53, LDL 141... rec> start Simva but he refuses, prefers diet. ~  FLP 12/10 showed TChol  222, TG 111, HDL 53, LDL 153... rec> start Simva40. ~  [place] 3/11 on Simva40 showed TChol 141, TG 36, HDL 59, LDL 75... continue Simva40. ~  [place] 3/12  on Simva40 showed TChol 157, TG  54, HDL 54, LDL 92 ~  FLP 10/13 on Simva40 showed TChol 163, TG 35, HDL 73, LDL 83 ~  FLP 10/14 on Simva40 showed TChol 149, TG 28, HDL 62, LDL 81  ~  FLP 10/15 on Simva40 showed TChol 150, TG 53, HDL 56, LDL 83 ~  FLP 10/16 on Simva40 showed TChol 159, TG 54, HDL 65, LDL 84 ~  FLP 10/17 on Simva40 showed  TChol 164, TG 83, HDL 64, LDL 84 ~  FLP 10/18 on simva40 showed  TChol 172, TG 71, HDL 63, LDL 95  Hx of COLONIC POLYPS (ICD-211.3), & HEMORRHOIDS (ICD-455.6) - + FamHx colon cancer in his grandfather... last colonoscopy 2/08 by Russell [name] showed 66mm polyp & hems... path= polypoid mucosa w/o adenomatous change... f/u planned 81yrs. ~  grandfather had colon cancer; pt noted rectal bleeding 5/14 & saw Russell [name] in f/u> colonoscopy done 6/14 w/ divertics, hems, & 4 polyps- tubulovillous adenomas & f/u planned 46yrs ~  Follow up colonoscopy done 02/01/16 w/ divertics, hems, no recurrent polyps & f/u planned 5 yrs by Russell [name]  Hx of ACUTE PROSTATITIS (ICD-601.0) - prev eval & Rx by Russell [name]... no symptoms... ~  PSA readings have all been wnl  KIDNEY STONES >>  ~  New onset problem 7/12 & went to ER for treatment; Rx conservatively & he was able to pass stone; followed up w/ Urology, Russell [name] (we don't have notes) & he reports stone was calc oxalate & rec to avoid colas, tea, etc; increase water intake & use citrate when poss... ~  CTAbd 7/12 showed mild bullous changes at lung bases & atx, +gallstones w/o inflamm, mild right hydronephrosis & hydroureter/ 3mm right UVJ stone; mild prostatic enlargement, multilevel DDD in spine, ateriosclerotic calcif in Ao & branches... ~  XRay Abd in ER 8/12 showed mod stool burden, mild lumbar spondylosis, NAD... ~  No known recurrence of stone disease to date...  DEGENERATIVE JOINT DISEASE (ICD-715.90) - he had a prev left knee arthroscopy by Russell [name] in 2008... he take 3 ADVIL prior to golfing... ~  10/14: states he was prev told bone on bone in knee & he'd need TKR per  Russell [name]=> done 37/10 w/ complic (patella dislocation) requiring surg 2wks later; s/p rehab & back to normal now...  SKIN CANCER, HX OF (ICD-V10.83) - he sees Russell [name] for skin check every 6 months... 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 ~  [id number]:  Russell [name] found MELANOMA on left post shoulder- referred to Surg Oncology Russell [name] at [facility] for wider excision, mapping, & sentinel node Bx- margins clear & LN was neg; they continue to follow...  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 ~  labs 12/09 showed Hg= 15.7 ~  labs 12/10 showed Hg= 16.6 ~  Labs 3/12 showed Hg= 16.1 ~  Labs 10/13 showed Hg= 15.0 ~  Labs 10/14 showed Hg= 15.3 ~  Labs 10/15 showed Hg= 15.5 ~  Labs 10/16 showed Hg= 15.9 ~  Labs 10/17 showed Hg= 15.3  HEALTH MAINTENANCE:  he takes ASA 81mg  on MWF since he has epistaxis on daily ASA early in 2009... ~  GI:  Russell [name] & up to date on screening colonoscopy... ~  GU:  Russell [name] in the past... PSA 10/14 = 1.43 ~  Immuniz:  He is up to date on all indicated vaccinations...   Past Medical History:  Diagnosis Date  .  Acute prostatitis   . Arthritis    hands  . Cancer ([place])    basal cell removed  . Dislocated patella 04/2013   S/P   ARTHROPLASTY  . Hemorrhoids   . History of skin cancer   . Hx of colonic polyps   . Kidney stone    passed stone - no surgery required  . Other and unspecified hyperlipidemia   . Smoker   . Whole blood donor     Past Surgical History:  Procedure Laterality Date  . COLONOSCOPY    . EYE SURGERY    . KNEE ARTHROPLASTY Left 04/30/2013   Procedure: COMPUTER ASSISTED TOTAL KNEE ARTHROPLASTY;  Surgeon: Russell Killings, MD;  Location: [place];  Service: Orthopedics;  Laterality: Left;  Left Cemented Total Knee Arthroplasty  . 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 Isaiah [name] at [facility]  . left knee arthroscopy  03/2007  . PATELLAR TENDON REPAIR Left  05/14/2013   Procedure: Left Total Knee Arthroplasty Medial Retinaculum Repair;  Surgeon: Russell Killings, MD;  Location: [place];  Service: Orthopedics;  Laterality: Left;  Left Total Knee Arthroplasty Medial Retinaculum Repair  . POLYPECTOMY    . skin cancer removed Left 2016   shoulder   . skin cancer removed N/A 02/22/2016   from top of his head.  this was [facility]  . TONSILLECTOMY AND ADENOIDECTOMY      Outpatient Encounter Medications as of 07/02/2017  Medication Sig  . amLODipine (NORVASC) 5 MG tablet Take 1 tablet (5 mg total) by mouth daily.  Marland Kitchen aspirin 81 MG tablet 1 tablet by mouth three times per week.  . Multiple Vitamins-Minerals (MULTIVITAMIN ADULTS 50+ PO) Take 1 tablet by mouth daily.  . simvastatin (ZOCOR) 40 MG tablet TAKE ONE TABLET EVERY EVENING  . [DISCONTINUED] losartan (COZAAR) 50 MG tablet Take 1 tablet (50 mg total) by mouth 2 (two) times daily. (Patient taking differently: Take 50 mg by mouth 2 (two) times daily. Pt is taking 100mg  daily)  . losartan-hydrochlorothiazide (HYZAAR) 100-25 MG tablet Take 1 tablet by mouth daily.  . [DISCONTINUED] 0.9 %  sodium chloride infusion    No facility-administered encounter medications on file as of 07/02/2017.     No Known Allergies   Immunization History  Administered Date(s) Administered  . Influenza Whole 02/25/2010, 02/18/2012  . Influenza, High Dose Seasonal PF 04/01/2014, 03/23/2016, 03/19/2017  . Influenza,inj,Quad PF,6+ Mos 03/24/2015  . Influenza-Unspecified 02/17/2013  . Pneumococcal Polysaccharide-23 03/19/2013    Current Medications, Allergies, Past Medical History, Past Surgical History, Family History, and Social History were reviewed in [employer] record.   Review of Systems         The patient complains of joint pain and arthritis.  The patient denies fever, chills, sweats, anorexia, fatigue, weakness, malaise, weight loss, sleep disorder, blurring, diplopia, eye irritation,  eye discharge, vision loss, eye pain, photophobia, earache, ear discharge, tinnitus, decreased hearing, nasal congestion, nosebleeds, sore throat, hoarseness, chest pain, palpitations, syncope, dyspnea on exertion, orthopnea, PND, peripheral edema, cough, dyspnea at rest, excessive sputum, hemoptysis, wheezing, pleurisy, nausea, vomiting, diarrhea, constipation, change in bowel habits, abdominal pain, melena, hematochezia, jaundice, gas/bloating, indigestion/heartburn, dysphagia, odynophagia, dysuria, hematuria, urinary frequency, urinary hesitancy, nocturia, incontinence, back pain, joint swelling, muscle cramps, muscle weakness, stiffness, sciatica, restless legs, leg pain at night, leg pain with exertion, rash, itching, dryness, suspicious lesions, paralysis, paresthesias, seizures, tremors, vertigo, transient blindness, frequent falls, frequent headaches, difficulty walking, depression,  anxiety, memory loss, confusion, cold intolerance, heat intolerance, polydipsia, polyphagia, polyuria, unusual weight change, abnormal bruising, bleeding, enlarged lymph nodes, urticaria, allergic rash, hay fever, and recurrent infections.     Objective:   Physical Exam    WD, WN, 72 y/o WM in NAD...  GENERAL:  Alert & oriented; pleasant & cooperative... HEENT:  Farmer/AT, EOM-wnl, PERRLA, EACs-clear, TMs-wnl, NOSE-clear, THROAT-clear & wnl. NECK:  Supple w/ full ROM; no JVD; normal carotid impulses w/o bruits; no thyromegaly or nodules palpated; no lymphadenopathy. CHEST:  Clear to P & A; without wheezes/ rales/ or rhonchi. HEART:  Regular Rhythm; without murmurs/ rubs/ or gallops. ABDOMEN:  Soft & nontender; normal bowel sounds; no organomegaly or masses detected. RECTAL:  Neg - prostate 2+ & nontender w/o nodules; stool hematest neg. EXT: without deformities, mild arthritic changes; no varicose veins/ venous insuffic/ or edema. NEURO:  CN's intact; motor testing normal; sensory testing normal; gait normal & balance  OK. DERM:  No lesions noted; no rash etc...  RADIOLOGY DATA:  Reviewed in the EPIC EMR & discussed w/ the patient...  LABORATORY DATA:  Reviewed in the EPIC EMR & discussed w/ the patient...   Assessment:      HBP >>  03/26/17>  He has borderline/ mild HBP & 2DEcho shows mild LVH=> rec starting a step one antihypertensive w/ LOSARTAN 50mg  po Qd=> called to [employer] [facility];  He is asked to get a BP cuff & start monitoring BP at home- call for any issues;  OK 2018 Flu vaccine today;  His only other prescription med is Russell [name] looks good- continue same. 07/02/17>   Jaesean's BP is still sl elev by his home BP checks esp in the PM readings & we decided to change his Losa50 to HYZAAR 100-12.5 Qam, continue the Amlod5 Qpm & he will cont to monitor BPs at home, call w/ any questions, and f/u 37mo..   Cigar SMOKER>  CXR is stable, NAD; 76mm calcif granuloma noted in [facility]; CTAbd in 2012 showed incidental bullous changes at lung bases; must quit all smoking... 11/27/16>  Moss presents w/ URI, HA, sinus drainage, hoarseness, clear sput- we decided to treat w/ ZPak, Depo80, Pred taper (see AVS), and rec OTC meds for comfort including Delsym, throat lozenges etc...   CHOL>  FLP looks good on Simva40; continue same + diet...  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  Hx Kid Stones & Prostatitis in past>  No recurrent kid stones or prostate symptoms; PSA= 1.43  DJD>  Followed by Russell [name]; s/p left TKR & doing well...  Skin Ca>  Followed by Russell [name] & pt reports doing OK...  ~  [id number]: Dx w/ melanoma left post shoulder area- removed by [facility], referred to Russell [name] at [facility] for wide excision, mapping, & sentine node bx=> all neg & they continue to follow.  Whole Blood Donor>  A+ blood type & has given ~150 pints to date...     Plan:     Patient's Medications  New Prescriptions    LOSARTAN-HYDROCHLOROTHIAZIDE (HYZAAR) 100-25 MG TABLET    Take 1 tablet by mouth daily.  Previous Medications   AMLODIPINE (NORVASC) 5 MG TABLET    Take 1 tablet (5 mg total) by mouth daily.   ASPIRIN 81 MG TABLET    1 tablet by mouth three times per week.   MULTIPLE VITAMINS-MINERALS (MULTIVITAMIN ADULTS 50+ PO)  Take 1 tablet by mouth daily.   SIMVASTATIN (ZOCOR) 40 MG TABLET    TAKE ONE TABLET EVERY EVENING  Modified Medications   No medications on file  Discontinued Medications   LOSARTAN (COZAAR) 50 MG TABLET    Take 1 tablet (50 mg total) by mouth 2 (two) times daily.

## 2017-07-19 DIAGNOSIS — C4401 Basal cell carcinoma of skin of lip: Secondary | ICD-10-CM | POA: Diagnosis not present

## 2017-08-04 ENCOUNTER — Other Ambulatory Visit: Payer: Self-pay | Admitting: Pulmonary Disease

## 2017-08-11 ENCOUNTER — Encounter: Payer: Self-pay | Admitting: Pulmonary Disease

## 2017-08-13 NOTE — Telephone Encounter (Signed)
-----   Message from [employer], Generic sent at 08/11/2017 10:53 AM EST -----    Dr. Lenna Gilford - My blood pressure has gone down considerably. In fact, we may have gone to too much medication. Here are my most recent BP readings:  06/30/17 - 122/84 8:15 a.m.  1/13     146/89 5:15 p.m.  1/19     120/74 8:00 a.m.  1/23     132/83 6:00 p.m.  1/26     106/71 7:30 a.m.  1/30     127/78 5:45 p.m.  2/2      103/75 8:00 a.m.  2/9      103/65 8:15 a.m.  2/9      137/91 5:15 p.m.  2/11     128/87 5:00 p.m.  2/16     103/70 7:15 a.m.  2/16     109/78 3:45 p.m.  2/23     98/69 8:20 a.m.    So, what do you think? Are we on the right track?  Thanks!   ----------------------------------  SN please advise on this, thanks.

## 2017-08-14 NOTE — Telephone Encounter (Signed)
I called pt to discuss his message re: BP readings but he is on vacation... I left message & office phone # to ret my call when he is able...  SMN

## 2017-08-22 DIAGNOSIS — Z1283 Encounter for screening for malignant neoplasm of skin: Secondary | ICD-10-CM | POA: Diagnosis not present

## 2017-08-22 DIAGNOSIS — Z08 Encounter for follow-up examination after completed treatment for malignant neoplasm: Secondary | ICD-10-CM | POA: Diagnosis not present

## 2017-08-22 DIAGNOSIS — X32XXXD Exposure to sunlight, subsequent encounter: Secondary | ICD-10-CM | POA: Diagnosis not present

## 2017-08-22 DIAGNOSIS — C44719 Basal cell carcinoma of skin of left lower limb, including hip: Secondary | ICD-10-CM | POA: Diagnosis not present

## 2017-08-22 DIAGNOSIS — Z85820 Personal history of malignant melanoma of skin: Secondary | ICD-10-CM | POA: Diagnosis not present

## 2017-08-22 DIAGNOSIS — L570 Actinic keratosis: Secondary | ICD-10-CM | POA: Diagnosis not present

## 2017-08-22 DIAGNOSIS — L821 Other seborrheic keratosis: Secondary | ICD-10-CM | POA: Diagnosis not present

## 2017-09-24 ENCOUNTER — Encounter: Payer: Self-pay | Admitting: Pulmonary Disease

## 2017-09-24 ENCOUNTER — Other Ambulatory Visit (INDEPENDENT_AMBULATORY_CARE_PROVIDER_SITE_OTHER): Payer: Medicare Other

## 2017-09-24 ENCOUNTER — Ambulatory Visit (INDEPENDENT_AMBULATORY_CARE_PROVIDER_SITE_OTHER): Payer: Medicare Other | Admitting: Pulmonary Disease

## 2017-09-24 VITALS — BP 122/64 | HR 56 | Temp 97.6°F | Ht 72.0 in | Wt 194.8 lb

## 2017-09-24 DIAGNOSIS — Z9889 Other specified postprocedural states: Secondary | ICD-10-CM | POA: Diagnosis not present

## 2017-09-24 DIAGNOSIS — I10 Essential (primary) hypertension: Secondary | ICD-10-CM | POA: Diagnosis not present

## 2017-09-24 DIAGNOSIS — M159 Polyosteoarthritis, unspecified: Secondary | ICD-10-CM

## 2017-09-24 DIAGNOSIS — Z85820 Personal history of malignant melanoma of skin: Secondary | ICD-10-CM | POA: Diagnosis not present

## 2017-09-24 DIAGNOSIS — I517 Cardiomegaly: Secondary | ICD-10-CM | POA: Insufficient documentation

## 2017-09-24 DIAGNOSIS — E7800 Pure hypercholesterolemia, unspecified: Secondary | ICD-10-CM | POA: Diagnosis not present

## 2017-09-24 DIAGNOSIS — M150 Primary generalized (osteo)arthritis: Secondary | ICD-10-CM | POA: Diagnosis not present

## 2017-09-24 LAB — BASIC METABOLIC PANEL
BUN: 21 mg/dL (ref 6–23)
CHLORIDE: 104 meq/L (ref 96–112)
CO2: 26 mEq/L (ref 19–32)
Calcium: 9.7 mg/dL (ref 8.4–10.5)
Creatinine, Ser: 1.04 mg/dL (ref 0.40–1.50)
GFR: 75.12 mL/min (ref >60.00)
Glucose, Bld: 73 mg/dL (ref 70–99)
POTASSIUM: 4 meq/L (ref 3.5–5.1)
SODIUM: 140 meq/L (ref 135–145)

## 2017-09-24 NOTE — Patient Instructions (Signed)
Today we updated your med list in our EPIC system...    Continue your current medications the same...  Today we reviewed your BP readings at home & checked a Metabolic panel...    We will contact you w/ the results when available & see if any changes are in order...  Continue to monitor your BP periodically & pay attention to your system...    Call for any questions...  Let's plan a follow up visit in 38mo, sooner if needed for problems.Marland KitchenMarland Kitchen

## 2017-09-24 NOTE — Progress Notes (Signed)
Subjective:     Patient ID: Russell [name], male   DOB: Apr 06, 1946, 72 y.o.   MRN: [id number]  HPI 72 y/o WM here for a follow up visit...   ~  August 22, 2010:  Yearly follow up- doing well overall notes some left knee pain (he works out in gym 5d/wk +golf etc & uses Advil Prn), occas nasal drainage & epistaxis (wife made appt w/ ENT later this week- we discussed saline)...    On Simva40 for Chol & FLP 3/12 shows TChol 157, TG 54, HDL 54, LDL 92> continue same +diet etc...  EKG= SBrady 54/min, WNL.Marland KitchenMarland Kitchen CPX labs all look good (he didn't go for his XRay today)...  ~  March 19, 2012:  19 month ROV & CPX> Russell [name] has had a good interval except for the right kidney stone 7-8/12, treated via ER & was able to pass, then seen by Urology (per pt hx we do not have notes & nothing in Epic), sounds like calc oxalate stones w/ rec for dietary adjust- no colas, tea, etc & increase citrate; he has been doing well since then & no known recurrence...    His only meds are ASA81 and Simva40> tol well & FLP today is good- all parameters at goals, continue same...  His BP is sl elev today at 160/88 but he's had a stressful morn, & BP's at his last two blood donor appts= 110-120/ 60-70's;  He exercises regularly w/o difficulty...    We reviewed prob list, meds, xrays and labs> see below for updates >> he'll get flu vaccine next week...  CXR 10/13 showed normal heart size, clear lungs, tiny granuloma left base, NAD...  LABS 10/13:  FLP- at goals on Simva40;  Chems- wnl;  CBC- wnl;  TSH=0.83;  PSA= 1.23;  Urine- clear  ~  March 19, 2013:  Yearly ROV & Russell [name] is doing well x for some left knee pain & he has appt w/ Ortho next week; a business assoc recently passed from metastatic colon ca; he exercises regularly- 5d per week at sport time... We reviewed the following medical problems during today's office visit >>     Smoker & Abn CXR> he smokes cigars on the golf course & has a sm LLL granuloma on CXR; he remains  asymptomatic w/o cough, sput, SOB, CP, etc...    CHOL> on Simva40; prev LDL as high as 150 range on diet alone; FLP 10/14 shows TChol 149, TG 28, HDL 62, LDL 81    GI- divertics, colon polyps & hems> grandfather had colon cancer; pt noted rectal bleeding 5/14 & saw DrPerry in f/u> colonoscopy done 6/14 w/ divertics, hems, & 4 polyps- tubulovillous adenomas & f/u planned 3-39yrs    GU- hx prostatitis & kid stones> had stone 2012 able to pass it; Urology eval by DrOttelin & diet adjust; no recurrent problem since then...    DJD> he has seen DrYates in the past & had left knee arthroscopy; says he was told bone on bone & would need replacement; he takes 3 Advil prior to golfing...    Hx skin cancer> his friend and business partner died from metastatic melanoma; pt has DrHall check his skin every 46mo...    Blood donor> his has donated over 150 pts he says; continues to donate regularly; labs 10/14 showed Hg= 15.3 We reviewed prob list, meds, xrays and labs> see below for updates >>   CXR 10/14 showed normal heart size, clear lungs, 72mm granuloma LLL, NAD.Marland KitchenMarland Kitchen  EKG-  SBrady, rate54, wnl, NAD...  LABS 10/14:  FLP- at goals on Simva40;  Chems- wnl;  CBC- wnl;  TSH=1.14;  PSA=1.43...   ~  March 19, 2014:  Yearly ROV & review of medical problems> Russell [name] had his left TKR 11/14 by DrYates then had complic w/ patella dislocation requiring a se4cond surg by DrDuda 2 wks later; this was followed by rehab & now back to normal & he is very pleased- fully recovered, playing golf, etc... 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 We reviewed the following medical problems during today's office visit >>     Smoker & Abn CXR> he smokes cigars on the golf course & has a sm LLL granuloma on CXR; he remains asymptomatic w/o cough, sput, SOB, CP, etc...    CHOL> on Simva40; prev LDL as high as 150 range on diet alone; FLP 10/15 shows TChol 150, TG 53, HDL 56, LDL  83    GI- divertics, colon polyps & hems> grandfather had colon cancer; pt noted rectal bleeding 5/14 & saw DrPerry in f/u> colonoscopy done 6/14 w/ divertics, hems, & 4 polyps- tubulovillous adenomas & f/u planned 3-60yrs    GU- hx prostatitis & kid stones> had stone 2012 able to pass it; Urology eval by DrOttelin & diet adjust; no recurrent problem since then...    DJD> he has seen DrYates in the past & had left knee arthroscopy; finally had left TKR 40/10 w/ complic (patella dislocation) requiring surg 2wks later; s/p rehab & back to normal now...    Hx skin cancer> his friend and business partner died from metastatic melanoma; pt has DrHall check his skin every 97mo...    Blood donor> his has donated over 150 pts he says; continues to donate regularly; labs 10/14 showed Hg= 15.3 We reviewed prob list, meds, xrays and labs> see below for updates >> he will get Flu shot at work in 2 wks...   CXR 10/15 showed norm heart size, mild tortuosity of Ao, clear lungs, NAD...  LABS 10/15:  FLP- at goals on Simva40;  Chems- wnl;  CBC- wnl;  TSH=0.85;  PSA=1.73...  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 otherw feeling well- no new complaints or concerns... We reviewed the following medical problems during today's office visit >>     Smoker & Abn CXR> he smokes cigars on the golf course & has a sm LLL granuloma on CXR; he remains asymptomatic w/o cough, sput, SOB, CP, etc...    CHOL> on Simva40; prev LDL as high as 150 range on diet alone; FLP 10/16 shows TChol 159, TG 54, HDL 65, LDL 84    GI- divertics, colon polyps & hems> grandfather had colon cancer; pt noted rectal bleeding 5/14 & saw DrPerry in f/u> colonoscopy done 6/14 w/ divertics, hems, & 4 polyps-  tubulovillous adenomas & f/u planned 3-76yrs    GU- hx prostatitis & kid stones> had stone 2012 able to pass it; Urology eval by DrOttelin & diet adjust; no recurrent problem since then...    DJD> he has seen DrYates in the past & had left knee arthroscopy; finally had left TKR 27/25 w/ complic (patella dislocation) requiring surg 2wks later; s/p rehab &  back to normal now...    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    Blood donor> his has donated over 150 pts he says; continues to donate regularly; labs 10/14 showed Hg= 15.3 EXAM shows Afeb, VSS, O2sat=96% on RA;  Wt=190# stable;  HEENT- neg, mallampati2;  Chest- clear w/o w/r/r;  Heart- RR w/o m/r/g;  Abd- soft, nontender, neg;  Ext- neg w/o c/c/e;  Neuro- intact...   CXR 03/24/15 showed norm heart size, clear lungs, NAD.Marland KitchenMarland Kitchen   EKG 03/24/15 showed SBrady, rate50, WNL, NAD...  LABS 03/2015>  FLP at goals on Simva40;  Chems- wnl;  CBC- wnl;  TSH=1.10;  PSA=1.00  IMP/PLANS>>  Kaysen was given 2016 Flu vaccine & the Prevnar-13 shot today; he will maintain f/u w/ [facility] surgery; continue same meds...  ~  March 23, 2016:  39yr ROV & Russell [name] returns for a follow up visit, doing satis, no new complaints or concerns; he continues to f/u w/ DERM regularly & recently had a few placed frozen... We reviewed the following medical problems during today's office visit >>     Smoker & Abn CXR> he smokes cigars on the golf course & has a sm LLL granuloma on CXR; he remains asymptomatic w/o cough, sput, SOB, CP, etc...    CHOL> on Simva40; prev LDL as high as 150 range on diet alone; FLP 10/17 shows TChol 164, TG 83, HDL 64, LDL 84    GI- divertics, colon polyps & hems> grandfather had colon cancer; pt noted rectal bleeding 5/14 & saw DrPerry in f/u> colonoscopy done 6/14 w/  divertics, hems, & 4 polyps- tubulovillous adenomas & f/u planned 3-67yrs    GU- hx prostatitis & kid stones> had stone 2012 able to pass it; Urology eval by DrOttelin & diet adjust; no recurrent problem since then...    DJD> he has seen DrYates in the past & had left knee arthroscopy; finally had left TKR 09/32 w/ complic (patella dislocation) requiring surg 2wks later; s/p rehab & back to normal now...    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    Blood donor> his has donated over 150 pts he says; continues to donate regularly; labs 10/14 showed Hg= 15.3 EXAM shows Afeb, VSS, O2sat=96% on RA;  Wt=192# stable;  HEENT- neg, mallampati2;  Chest- clear w/o w/r/r;  Heart- RR w/o m/r/g;  Abd- soft, nontender, neg;  Ext- neg w/o c/c/e;  Neuro- intact...   CXR 03/23/16 showed norm heart size, calcif Ao, clear lungs- NAD...   LABS 03/23/16>  FLP- at goals on Simva40;  Chems- wnl;  CBC- wnl;  TSH=0.78;  PSA=1.45;  VitD=37=> rec 1000u/d supplement... IMP/PLAN>>  Russell [name] is stable- given 2017 flu vaccine today & rec to continue same + diet & exercise...  ~  November 27, 2016:  39mo ROV & pt requested add-on appt for URI as he is flying to [place] tomorrow on [employer] reports 1 wk hx mild sore throat, hoarseness, congestion, and blowing clear mucus (no color, no blood); he notes sl HA & mild cough w/ small amt beige sput;  He is a cigar  smoker (mostly on the golf course) and has hx abn CXR w/ LLL granuloma, otherw clear;  He also had melanoma surg 12/2014 left post shoulder- followed at [facility]...     EXAM shows Afeb, VSS, O2sat=98% on RA;  Wt=190# stable;  HEENT- sl hoarse, mallampati2;  Chest- clear w/o w/r/r;  Heart- RR w/o m/r/g;  Abd- soft, nontender, neg;  Ext- neg w/o c/c/e;  Neuro- intact...  IMP/PLAN>>  We decided to treat w/  ZPak, Depo80, Pred taper (see AVS), and rec OTC meds for comfort including Delsym, throat lozenges etc...   ~  March 26, 2017:  77mo ROV & time for his annual check up>  Russell [name] reports that his URI responded nicely to Rx & he had a great wine trip to [place] & golf trip to [place];  Feeling well w/o new complaints or concerns- he denies CP, palpit, dizzy, SOB, edema, etc;  BP has been elevated on several recent BP checks- 160/90 today, 146/90 by Payton Mccallum 9/18, 162/98 by Ortho 8/18, etc=> we decided to check 2DEcho for further eval... We reviewed the following medical problems during today's office visit >>     Smoker & Abn CXR> he smokes cigars on the golf course & has a sm LLL granuloma on CXR; he remains asymptomatic w/o cough, sput, SOB, CP, etc...    CHOL> on Simva40; prev LDL as high as 150 range on diet alone; FLP 10/18 shows TChol 172, TG 71, HDL 63, LDL 95    GI- divertics, colon polyps & hems> grandfather had colon cancer; pt noted rectal bleeding 5/14 & saw DrPerry in f/u> colonoscopy done 6/14 w/ divertics, hems, & 4 polyps- tubulovillous adenomas & f/u planned 24yrs; f/u colon done 02/01/16 w/ divertics, hems, no recurrent polyps & f/u planned 5 yrs by DrPerry...    GU- hx prostatitis & kid stones> had stone 2012 able to pass it; Urology eval by DrOttelin & diet adjust; no recurrent problem since then; PSA has been normal...    DJD> he has seen DrYates in the past & had left knee arthroscopy; finally had left TKR 54/65 w/ complic (patella dislocation) requiring surg 2wks later; s/p rehab & back to normal now...    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    Blood donor> his has donated over 150 pts he says; continues to donate but not as regularly; labs 10/18 showed Hg= 16.1 EXAM shows Afeb, Borderline HBP 160/90,  O2sat=99% on RA;  Wt=189# stable;  HEENT- neg, mallampati2;  Chest- clear w/o w/r/r;  Heart- RR w/o m/r/g;  Abd- soft, nontender, neg;  Ext- neg w/o c/c/e;  Neuro- intact...   LABS 03/26/17>  FLP- wnl, all parameters are at goals on Simva40;  Chems- wnl x FBS=106;  CBC- wnl;  TSH=0.52;  PSA=1.61  2DEcho 03/30/17>  Mild LVH w/ norm LVF, EF=60-65%, no regional wall motion abn, Gr1DD, trivAI, sl bowing of MV w/o prolapse & mild MR, mild LAdil at 50mm... IMP/PLAN>>  He has borderline/ mild HBP & 2DEcho shows mild LVH=> rec starting a step one antihypertensive w/ LOSARTAN 50mg  po Qd=> called to [employer];  He is asked to get a BP cuff & start monitoring BP at home- call for any issues;  OK 2018 Flu vaccine today;  His only other prescription med is [place]  looks good- continue same...  ~  July 02, 2017:  32mo ROV & f/u HBP> when last seen in Oct2018 we started Losartan50 for HBP & mild LVH found on his 2DEcho;  He called in Dec w/ BP readings that were still up to 170/100 intermittently on his home checks so we incr the Losartan to 100mg AM & added Amlodipine5PM;  Since then BP readings are improved (160/90 at high in PM readings, AM BPs look pretty good) but not quite where we want them => so we decided to change Losartan to HYZAAR 100-12.5 today... otherw feeling well-- denies HA, visual changes, CP, palpit, dizzy, edema, etc...     We reviewed LABS from 03/2017 and the 2DEcho dated 03/30/17...  EXAM shows Afeb, BP= 140/82, O2sat=96% on RA;  Wt=195# stable;  HEENT- neg, mallampati2;  Chest- clear w/o w/r/r;  Heart- RR w/o m/r/g;  Abd- soft, nontender, neg;  Ext- neg w/o c/c/e;  Neuro- intact...  IMP/PLAN>>  Cotton's BP is still sl elev by his home BP checks esp in the PM readings & we decided to change his Losa100 to HYZAAR 100-12.5 Qam, continue the Amlod5 Qpm & he will cont to monitor BPs at home, call w/ any questions, and f/u 54mo...   ~  September 24, 2017:  58mo ROV & recheck BP>  Now on  HYZAAR100-25 (Pharm confirmed 100-25 was called in NOT the 100-12.5) + Amlodipine5 for his BP & home checks have all been <140/80 & he denies HAs, visual symptoms, CP, palpit, dizziness, SOB, or edema... BP here today = 122/64;  He smokes an occas cigar & not motivated to quit (~1 per wk);  We checked BMet today...    EXAM shows Afeb, BP= 122/64, O2sat=97% on RA;  Wt=195# stable;  HEENT- neg, mallampati2;  Chest- clear w/o w/r/r;  Heart- RR w/o m/r/g;  Abd- soft, nontender, neg;  Ext- neg w/o c/c/e;  Neuro- intact...   BMet 09/24/17>  wnl w/ K=4.0, Cr=1.04 IMP/PLAN>>  BP now well controlled, he is on the Hyzaar 100-25 & Amlod5; BMet is wnl-- may need to consider decr diuretic dose in follow up;  We plan ROV in 60mo w/ Fasting blood work...           Problem List:   SMOKER (ICD-305.1) - he is a cigar smoker and states that he does not inhale... smokers only occas "when I play golf or go to the bar"... ~  CXR 3/12 showed norm heart size, mild interstitial thickening, 69mm calc granuloma left lung base. ~  CTAbd 7/12 in ER for kidney stone showed incidental mild bullous changes at lung bases & bibasilar atx; also atherosclerotic changes in Ao & branches w/o aneurysm.  ~  CXR 10/13 showed tiny calcif granuloma at left lung base, norm heart size, otherw clear lungs... ~  CXR 10/14 showed normal heart size, clear lungs, 29mm granuloma LLL, NAD ~  CXR 10/15 showed norm heart size, mild tortuosity of Ao, clear lungs, NAD ~  CXR 10/16 showed norm heart size, clear lungs, NAD.Marland Kitchen.  ~  CXR 03/23/16 showed norm heart size, calcif Ao, clear lungs- NAD...   HBP/ Mild LVH on 2DEchocardiogram 03/2017 => BP levated here & at specialist offices over the last yr => check 2DEcho ~  2DEcho 03/30/17 showed mild LVH w/ norm LVF, EF=60-65%, no regional wall motion abn, Gr1DD, trivAI, sl bowing of MV w/o prolapse & mild MR, mild LAdil at 24mm... ~  Started on LOSARTAN50=> control was not adeq so dose  incr to 100mg  & Amlod5  added... ~  07/02/17> Russell [name]'s BP is still sl elev by his home BP checks esp in the PM readings & we decided to change his Losa100 to HYZAAR 100-12.5 Qam, continue the Amlod5 Qpm & he will cont to monitor BPs at home, call w/ any questions, and f/u 25mo.  HYPERCHOLESTEROLEMIA, BORDERLINE (ICD-272.4) - on diet Rx alone & prev not interested in med Rx... now he states he will consider medication if no better on his diet... ~  [place] 11/06 showed TChol 210, Tg 41, HDL 59, LDL 138... pt refers diet Rx. ~  [place] 12/09 showed TChol 219, Tg 43, HDL 53, LDL 141... rec> start Simva but he refuses, prefers diet. ~  FLP 12/10 showed TChol  222, TG 111, HDL 53, LDL 153... rec> start Simva40. ~  [place] 3/11 on Simva40 showed TChol 141, TG 36, HDL 59, LDL 75... continue Simva40. ~  [place] 3/12 on Simva40 showed TChol 157, TG 54, HDL 54, LDL 92 ~  FLP 10/13 on Simva40 showed TChol 163, TG 35, HDL 73, LDL 83 ~  FLP 10/14 on Simva40 showed TChol 149, TG 28, HDL 62, LDL 81  ~  FLP 10/15 on Simva40 showed TChol 150, TG 53, HDL 56, LDL 83 ~  FLP 10/16 on Simva40 showed TChol 159, TG 54, HDL 65, LDL 84 ~  FLP 10/17 on Simva40 showed  TChol 164, TG 83, HDL 64, LDL 84 ~  FLP 10/18 on simva40 showed  TChol 172, TG 71, HDL 63, LDL 95  Hx of COLONIC POLYPS (ICD-211.3), & HEMORRHOIDS (ICD-455.6) - + FamHx colon cancer in his grandfather... last colonoscopy 2/08 by DrPerry showed 40mm polyp & hems... path= polypoid mucosa w/o adenomatous change... f/u planned 66yrs. ~  grandfather had colon cancer; pt noted rectal bleeding 5/14 & saw DrPerry in f/u> colonoscopy done 6/14 w/ divertics, hems, & 4 polyps- tubulovillous adenomas & f/u planned 52yrs ~  Follow up colonoscopy done 02/01/16 w/ divertics, hems, no recurrent polyps & f/u planned 5 yrs by DrPerry  Hx of ACUTE PROSTATITIS (ICD-601.0) - prev eval & Rx by DrWrenn... no symptoms... ~  PSA readings have all been wnl  KIDNEY STONES >>  ~  New onset problem 7/12 & went to ER for treatment;  Rx conservatively & he was able to pass stone; followed up w/ Urology, DrOttelin (we don't have notes) & he reports stone was calc oxalate & rec to avoid colas, tea, etc; increase water intake & use citrate when poss... ~  CTAbd 7/12 showed mild bullous changes at lung bases & atx, +gallstones w/o inflamm, mild right hydronephrosis & hydroureter/ 82mm right UVJ stone; mild prostatic enlargement, multilevel DDD in spine, ateriosclerotic calcif in Ao & branches... ~  XRay Abd in ER 8/12 showed mod stool burden, mild lumbar spondylosis, NAD... ~  No known recurrence of stone disease to date...  DEGENERATIVE JOINT DISEASE (ICD-715.90) - he had a prev left knee arthroscopy by DrYates in 2008... he take 3 ADVIL prior to golfing... ~  10/14: states he was prev told bone on bone in knee & he'd need TKR per DrYates=> done 47/42 w/ complic (patella dislocation) requiring surg 2wks later; s/p rehab & back to normal now...  SKIN CANCER, HX OF (ICD-V10.83) - he sees DrHall for skin check every 6 months... 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 ~  [id number]:  DrHall found MELANOMA on left post shoulder- referred to Surg Oncology  [facility]Ollila [facility] for wider excision, mapping, & sentinel node Bx- margins clear & LN was neg; they continue to follow...  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 ~  labs 12/09 showed Hg= 15.7 ~  labs 12/10 showed Hg= 16.6 ~  Labs 3/12 showed Hg= 16.1 ~  Labs 10/13 showed Hg= 15.0 ~  Labs 10/14 showed Hg= 15.3 ~  Labs 10/15 showed Hg= 15.5 ~  Labs 10/16 showed Hg= 15.9 ~  Labs 10/17 showed Hg= 15.3  HEALTH MAINTENANCE:  he takes ASA 81mg  on MWF since he has epistaxis on daily ASA early in 2009... ~  GI:  DrPerry & up to date on screening colonoscopy... ~  GU:  DrWrenn in the past... PSA 10/14 = 1.43 ~  Immuniz:  He is up to date on all indicated vaccinations...   Past Medical History:   Diagnosis Date  . Acute prostatitis   . Arthritis    hands  . Cancer ([place])    basal cell removed  . Dislocated patella 04/2013   S/P   ARTHROPLASTY  . Hemorrhoids   . History of skin cancer   . Hx of colonic polyps   . Kidney stone    passed stone - no surgery required  . Other and unspecified hyperlipidemia   . Smoker   . Whole blood donor     Past Surgical History:  Procedure Laterality Date  . COLONOSCOPY    . EYE SURGERY    . KNEE ARTHROPLASTY Left 04/30/2013   Procedure: COMPUTER ASSISTED TOTAL KNEE ARTHROPLASTY;  Surgeon: Marybelle Killings, MD;  Location: [facility];  Service: Orthopedics;  Laterality: Left;  Left Cemented Total Knee Arthroplasty  . 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 Isaiah Blakes [facility]  . left knee arthroscopy  03/2007  . PATELLAR TENDON REPAIR Left 05/14/2013   Procedure: Left Total Knee Arthroplasty Medial Retinaculum Repair;  Surgeon: Marybelle Killings, MD;  Location: [facility];  Service: Orthopedics;  Laterality: Left;  Left Total Knee Arthroplasty Medial Retinaculum Repair  . POLYPECTOMY    . skin cancer removed Left 2016   shoulder   . skin cancer removed N/A 02/22/2016   from top of his head.  this was done at [facility]  . TONSILLECTOMY AND ADENOIDECTOMY      Outpatient Encounter Medications as of 09/24/2017  Medication Sig  . amLODipine (NORVASC) 5 MG tablet TAKE ONE TABLET DAILY  . aspirin 81 MG tablet 1 tablet by mouth three times per week.  . losartan-hydrochlorothiazide (HYZAAR) 100-25 MG tablet Take 1 tablet by mouth daily.  . simvastatin (ZOCOR) 40 MG tablet TAKE ONE TABLET EVERY EVENING  . Multiple Vitamins-Minerals (MULTIVITAMIN ADULTS 50+ PO) Take 1 tablet by mouth daily.   No facility-administered encounter medications on file as of 09/24/2017.     No Known Allergies   Immunization History  Administered Date(s) Administered  . Influenza Whole 02/25/2010, 02/18/2012  . Influenza, High Dose Seasonal PF 04/01/2014, 03/23/2016,  03/19/2017  . Influenza,inj,Quad PF,6+ Mos 03/24/2015  . Influenza-Unspecified 02/17/2013  . Pneumococcal Polysaccharide-23 03/19/2013    Current Medications, Allergies, Past Medical History, Past Surgical History, Family History, and Social History were reviewed in [employer] record.   Review of Systems         The patient complains of joint pain and arthritis.  The patient denies fever, chills, sweats, anorexia, fatigue, weakness, malaise, weight loss,  sleep disorder, blurring, diplopia, eye irritation, eye discharge, vision loss, eye pain, photophobia, earache, ear discharge, tinnitus, decreased hearing, nasal congestion, nosebleeds, sore throat, hoarseness, chest pain, palpitations, syncope, dyspnea on exertion, orthopnea, PND, peripheral edema, cough, dyspnea at rest, excessive sputum, hemoptysis, wheezing, pleurisy, nausea, vomiting, diarrhea, constipation, change in bowel habits, abdominal pain, melena, hematochezia, jaundice, gas/bloating, indigestion/heartburn, dysphagia, odynophagia, dysuria, hematuria, urinary frequency, urinary hesitancy, nocturia, incontinence, back pain, joint swelling, muscle cramps, muscle weakness, stiffness, sciatica, restless legs, leg pain at night, leg pain with exertion, rash, itching, dryness, suspicious lesions, paralysis, paresthesias, seizures, tremors, vertigo, transient blindness, frequent falls, frequent headaches, difficulty walking, depression, anxiety, memory loss, confusion, cold intolerance, heat intolerance, polydipsia, polyphagia, polyuria, unusual weight change, abnormal bruising, bleeding, enlarged lymph nodes, urticaria, allergic rash, hay fever, and recurrent infections.     Objective:   Physical Exam    WD, WN, 72 y/o WM in NAD...  GENERAL:  Alert & oriented; pleasant & cooperative... HEENT:  [place]/AT, EOM-wnl, PERRLA, EACs-clear, TMs-wnl, NOSE-clear, THROAT-clear & wnl. NECK:  Supple w/ full ROM; no JVD; normal  carotid impulses w/o bruits; no thyromegaly or nodules palpated; no lymphadenopathy. CHEST:  Clear to P & A; without wheezes/ rales/ or rhonchi. HEART:  Regular Rhythm; without murmurs/ rubs/ or gallops. ABDOMEN:  Soft & nontender; normal bowel sounds; no organomegaly or masses detected. RECTAL:  Neg - prostate 2+ & nontender w/o nodules; stool hematest neg. EXT: without deformities, mild arthritic changes; no varicose veins/ venous insuffic/ or edema. NEURO:  CN's intact; motor testing normal; sensory testing normal; gait normal & balance OK. DERM:  No lesions noted; no rash etc...  RADIOLOGY DATA:  Reviewed in the EPIC EMR & discussed w/ the patient...  LABORATORY DATA:  Reviewed in the EPIC EMR & discussed w/ the patient...   Assessment:      HBP >>  03/26/17>  He has borderline/ mild HBP & 2DEcho shows mild LVH=> rec starting a step one antihypertensive w/ LOSARTAN 50mg  po Qd=> called to [employer] [facility];  He is asked to get a BP cuff & start monitoring BP at home- call for any issues;  OK 2018 Flu vaccine today;  His only other prescription med is [place] looks good- continue same. 07/02/17>   Russell [name]'s BP is still sl elev by his home BP checks esp in the PM readings & we decided to change his Losa50 to HYZAAR 100-12.5 Qam, continue the Amlod5 Qpm & he will cont to monitor BPs at home, call w/ any questions, and f/u 78mo.. 09/24/17>    BP now well controlled, he is on the Hyzaar 100-25 & Amlod5; BMet is wnl-- may need to consider decr diuretic dose in follow up;  We plan ROV in 3mo w/ Fasting blood work.   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 11/27/16>  Russell [name] presents w/ URI, HA, sinus drainage, hoarseness, clear sput- we decided to treat w/ ZPak, Depo80, Pred taper (see AVS), and rec OTC meds for comfort including Delsym, throat lozenges etc...   CHOL>  FLP looks good on  Simva40; continue same + diet...  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  Hx Kid Stones & Prostatitis in past>  No recurrent kid stones or prostate symptoms; PSA= 1.43  DJD>  Followed by DrYates; s/p left TKR & doing well...  Skin Ca>  Followed by Natale Milch & pt reports doing OK...  ~  [id number]: Dx w/ melanoma left post shoulder area- removed by [facility], referred to [facility] at [facility] for wide excision, mapping, & sentine node bx=> all neg & they continue to follow.  Whole Blood Donor>  A+ blood type & has given ~150 pints to date...     Plan:     Patient's Medications  New Prescriptions   No medications on file  Previous Medications   AMLODIPINE (NORVASC) 5 MG TABLET    TAKE ONE TABLET DAILY   ASPIRIN 81 MG TABLET    1 tablet by mouth three times per week.   LOSARTAN-HYDROCHLOROTHIAZIDE (HYZAAR) 100-25 MG TABLET    Take 1 tablet by mouth daily.   MULTIPLE VITAMINS-MINERALS (MULTIVITAMIN ADULTS 50+ PO)    Take 1 tablet by mouth daily.   SIMVASTATIN (ZOCOR) 40 MG TABLET    TAKE ONE TABLET EVERY EVENING  Modified Medications   No medications on file  Discontinued Medications   No medications on file

## 2017-10-02 DIAGNOSIS — Z85828 Personal history of other malignant neoplasm of skin: Secondary | ICD-10-CM | POA: Diagnosis not present

## 2017-10-02 DIAGNOSIS — X32XXXD Exposure to sunlight, subsequent encounter: Secondary | ICD-10-CM | POA: Diagnosis not present

## 2017-10-02 DIAGNOSIS — Z08 Encounter for follow-up examination after completed treatment for malignant neoplasm: Secondary | ICD-10-CM | POA: Diagnosis not present

## 2017-10-02 DIAGNOSIS — L570 Actinic keratosis: Secondary | ICD-10-CM | POA: Diagnosis not present

## 2017-10-02 DIAGNOSIS — D0461 Carcinoma in situ of skin of right upper limb, including shoulder: Secondary | ICD-10-CM | POA: Diagnosis not present

## 2017-11-14 DIAGNOSIS — X32XXXD Exposure to sunlight, subsequent encounter: Secondary | ICD-10-CM | POA: Diagnosis not present

## 2017-11-14 DIAGNOSIS — L570 Actinic keratosis: Secondary | ICD-10-CM | POA: Diagnosis not present

## 2017-11-14 DIAGNOSIS — D0461 Carcinoma in situ of skin of right upper limb, including shoulder: Secondary | ICD-10-CM | POA: Diagnosis not present

## 2018-01-15 DIAGNOSIS — X32XXXD Exposure to sunlight, subsequent encounter: Secondary | ICD-10-CM | POA: Diagnosis not present

## 2018-01-15 DIAGNOSIS — L570 Actinic keratosis: Secondary | ICD-10-CM | POA: Diagnosis not present

## 2018-01-15 DIAGNOSIS — L821 Other seborrheic keratosis: Secondary | ICD-10-CM | POA: Diagnosis not present

## 2018-01-15 DIAGNOSIS — D485 Neoplasm of uncertain behavior of skin: Secondary | ICD-10-CM | POA: Diagnosis not present

## 2018-01-15 DIAGNOSIS — L905 Scar conditions and fibrosis of skin: Secondary | ICD-10-CM | POA: Diagnosis not present

## 2018-01-22 ENCOUNTER — Other Ambulatory Visit: Payer: Self-pay | Admitting: Pulmonary Disease

## 2018-01-24 ENCOUNTER — Telehealth: Payer: Self-pay | Admitting: Pulmonary Disease

## 2018-01-24 MED ORDER — SIMVASTATIN 40 MG PO TABS: 40.0000 mg | ORAL_TABLET | Freq: Every evening | ORAL | 2 refills | Status: DC

## 2018-01-24 NOTE — Telephone Encounter (Signed)
Called and spoke with pt regarding refill on rx Zocor 40mg  Placed refill with [facility] today Pt verbalized understanding, and had no further concerns Nothing further needed at this time.

## 2018-03-05 DIAGNOSIS — C4362 Malignant melanoma of left upper limb, including shoulder: Secondary | ICD-10-CM | POA: Diagnosis not present

## 2018-03-26 ENCOUNTER — Ambulatory Visit (INDEPENDENT_AMBULATORY_CARE_PROVIDER_SITE_OTHER)
Admission: RE | Admit: 2018-03-26 | Discharge: 2018-03-26 | Disposition: A | Discharge status: Home / Self Care | Payer: Medicare Other | Source: Ambulatory Visit | Attending: Pulmonary Disease | Admitting: Pulmonary Disease

## 2018-03-26 ENCOUNTER — Encounter: Payer: Self-pay | Admitting: Pulmonary Disease

## 2018-03-26 ENCOUNTER — Ambulatory Visit (INDEPENDENT_AMBULATORY_CARE_PROVIDER_SITE_OTHER): Payer: Medicare Other | Admitting: Pulmonary Disease

## 2018-03-26 ENCOUNTER — Ambulatory Visit (INDEPENDENT_AMBULATORY_CARE_PROVIDER_SITE_OTHER): Payer: Medicare Other

## 2018-03-26 ENCOUNTER — Other Ambulatory Visit (INDEPENDENT_AMBULATORY_CARE_PROVIDER_SITE_OTHER): Payer: Medicare Other

## 2018-03-26 VITALS — BP 112/66 | HR 53 | Temp 97.6°F | Ht 72.0 in | Wt 191.6 lb

## 2018-03-26 DIAGNOSIS — Z0000 Encounter for general adult medical examination without abnormal findings: Secondary | ICD-10-CM

## 2018-03-26 DIAGNOSIS — D126 Benign neoplasm of colon, unspecified: Secondary | ICD-10-CM | POA: Diagnosis not present

## 2018-03-26 DIAGNOSIS — J8410 Pulmonary fibrosis, unspecified: Secondary | ICD-10-CM

## 2018-03-26 DIAGNOSIS — M150 Primary generalized (osteo)arthritis: Secondary | ICD-10-CM | POA: Diagnosis not present

## 2018-03-26 DIAGNOSIS — Z96652 Presence of left artificial knee joint: Secondary | ICD-10-CM | POA: Diagnosis not present

## 2018-03-26 DIAGNOSIS — E7800 Pure hypercholesterolemia, unspecified: Secondary | ICD-10-CM

## 2018-03-26 DIAGNOSIS — Z85820 Personal history of malignant melanoma of skin: Secondary | ICD-10-CM

## 2018-03-26 DIAGNOSIS — I10 Essential (primary) hypertension: Secondary | ICD-10-CM

## 2018-03-26 DIAGNOSIS — I517 Cardiomegaly: Secondary | ICD-10-CM

## 2018-03-26 DIAGNOSIS — Z23 Encounter for immunization: Secondary | ICD-10-CM

## 2018-03-26 DIAGNOSIS — J984 Other disorders of lung: Secondary | ICD-10-CM

## 2018-03-26 DIAGNOSIS — M159 Polyosteoarthritis, unspecified: Secondary | ICD-10-CM

## 2018-03-26 DIAGNOSIS — K5730 Diverticulosis of large intestine without perforation or abscess without bleeding: Secondary | ICD-10-CM | POA: Diagnosis not present

## 2018-03-26 DIAGNOSIS — Z9889 Other specified postprocedural states: Secondary | ICD-10-CM | POA: Diagnosis not present

## 2018-03-26 LAB — COMPREHENSIVE METABOLIC PANEL
ALBUMIN: 4.2 g/dL (ref 3.5–5.2)
ALK PHOS: 50 U/L (ref 39–117)
ALT: 16 U/L (ref 0–53)
AST: 19 U/L (ref 0–37)
BUN: 17 mg/dL (ref 6–23)
CHLORIDE: 105 meq/L (ref 96–112)
CO2: 29 mEq/L (ref 19–32)
Calcium: 9.4 mg/dL (ref 8.4–10.5)
Creatinine, Ser: 0.89 mg/dL (ref 0.40–1.50)
GFR: 89.78 mL/min (ref >60.00)
Glucose, Bld: 101 mg/dL — ABNORMAL HIGH (ref 70–99)
POTASSIUM: 4 meq/L (ref 3.5–5.1)
Sodium: 140 mEq/L (ref 135–145)
TOTAL PROTEIN: 6.6 g/dL (ref 6.0–8.3)
Total Bilirubin: 1.1 mg/dL (ref 0.2–1.2)

## 2018-03-26 LAB — CBC WITH DIFFERENTIAL/PLATELET
Basophils Absolute: 0 10*3/uL (ref 0.0–0.1)
Basophils Relative: 0.7 % (ref 0.0–3.0)
EOS PCT: 6.4 % — AB (ref 0.0–5.0)
Eosinophils Absolute: 0.5 10*3/uL (ref 0.0–0.7)
HEMATOCRIT: 39.7 % (ref 39.0–52.0)
Hemoglobin: 13.4 g/dL (ref 13.0–17.0)
LYMPHS ABS: 2.4 10*3/uL (ref 0.7–4.0)
Lymphocytes Relative: 33.3 % (ref 12.0–46.0)
MCHC: 33.7 g/dL (ref 30.0–36.0)
MCV: 96.1 fl (ref 78.0–100.0)
MONOS PCT: 6.8 % (ref 3.0–12.0)
Monocytes Absolute: 0.5 10*3/uL (ref 0.1–1.0)
NEUTROS ABS: 3.8 10*3/uL (ref 1.4–7.7)
NEUTROS PCT: 52.8 % (ref 43.0–77.0)
PLATELETS: 205 10*3/uL (ref 150.0–400.0)
RBC: 4.14 Mil/uL — ABNORMAL LOW (ref 4.22–5.81)
RDW: 13 % (ref 11.5–15.5)
WBC: 7.3 10*3/uL (ref 4.0–10.5)

## 2018-03-26 LAB — PSA: PSA: 1.12 ng/mL (ref 0.10–4.00)

## 2018-03-26 LAB — LIPID PANEL
CHOL/HDL RATIO: 3
Cholesterol: 138 mg/dL (ref 0–200)
HDL: 53.2 mg/dL (ref >39.00)
LDL CALC: 73 mg/dL (ref 0–99)
NONHDL: 84.46
Triglycerides: 55 mg/dL (ref 0.0–149.0)
VLDL: 11 mg/dL (ref 0.0–40.0)

## 2018-03-26 LAB — TSH: TSH: 0.71 u[IU]/mL (ref 0.35–4.50)

## 2018-03-26 NOTE — Patient Instructions (Addendum)
Today we updated your med list in our EPIC system...    Continue your current medications the same...    We refilled your meds per request...  Today we did a follow up CXR, EKG, and FASTING blood work...    We will contact you w/ the results when available...   We gave you the 2019 FLU shot today...  Please set up a return to the shot clinic for the last of the pneumonia shots (PREVNAR-13 vaccine) at your convenience...  Call for any questions or if I can be of service in any way...  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      Wishing you & your family good health and much happiness in the years to come.Marland KitchenMarland Kitchen

## 2018-03-27 ENCOUNTER — Encounter: Payer: Self-pay | Admitting: Pulmonary Disease

## 2018-03-27 NOTE — Progress Notes (Signed)
Subjective:     Patient ID: Russell [name], male   DOB: Jun 07, 1946, 72 y.o.   MRN: [id number]  HPI 72 y/o WM here for a follow up visit...   ~  August 22, 2010:  Yearly follow up- doing well overall notes some left knee pain (he works out in gym 5d/wk +golf etc & uses Advil Prn), occas nasal drainage & epistaxis (wife made appt w/ ENT later this week- we discussed saline)...    On Simva40 for Chol & FLP 3/12 shows TChol 157, TG 54, HDL 54, LDL 92> continue same +diet etc...  EKG= SBrady 54/min, WNL.Marland KitchenMarland Kitchen CPX labs all look good (he didn't go for his XRay today)...  ~  March 19, 2012:  19 month ROV & CPX> Ollie has had a good interval except for the right kidney stone 7-8/12, treated via ER & was able to pass, then seen by Urology (per pt hx we do not have notes & nothing in Epic), sounds like calc oxalate stones w/ rec for dietary adjust- no colas, tea, etc & increase citrate; he has been doing well since then & no known recurrence...    His only meds are ASA81 and Simva40> tol well & FLP today is good- all parameters at goals, continue same...  His BP is sl elev today at 160/88 but he's had a stressful morn, & BP's at his last two blood donor appts= 110-120/ 60-70's;  He exercises regularly w/o difficulty...    We reviewed prob list, meds, xrays and labs> see below for updates >> he'll get flu vaccine next week...  CXR 10/13 showed normal heart size, clear lungs, tiny granuloma left base, NAD...  LABS 10/13:  FLP- at goals on Simva40;  Chems- wnl;  CBC- wnl;  TSH=0.83;  PSA= 1.23;  Urine- clear  ~  March 19, 2013:  Yearly ROV & Russell [name] is doing well x for some left knee pain & he has appt w/ Ortho next week; a business assoc recently passed from metastatic colon ca; he exercises regularly- 5d per week at sport time... We reviewed the following medical problems during today's office visit >>     Smoker & Abn CXR> he smokes cigars on the golf course & has a sm LLL granuloma on CXR; he remains  asymptomatic w/o cough, sput, SOB, CP, etc...    CHOL> on Simva40; prev LDL as high as 150 range on diet alone; FLP 10/14 shows TChol 149, TG 28, HDL 62, LDL 81    GI- divertics, colon polyps & hems> grandfather had colon cancer; pt noted rectal bleeding 5/14 & saw DrPerry in f/u> colonoscopy done 6/14 w/ divertics, hems, & 4 polyps- tubulovillous adenomas & f/u planned 3-46yrs    GU- hx prostatitis & kid stones> had stone 2012 able to pass it; Urology eval by DrOttelin & diet adjust; no recurrent problem since then...    DJD> he has seen DrYates in the past & had left knee arthroscopy; says he was told bone on bone & would need replacement; he takes 3 Advil prior to golfing...    Hx skin cancer> his friend and business partner died from metastatic melanoma; pt has DrHall check his skin every 64mo...    Blood donor> his has donated over 150 pts he says; continues to donate regularly; labs 10/14 showed Hg= 15.3 We reviewed prob list, meds, xrays and labs> see below for updates >>   CXR 10/14 showed normal heart size, clear lungs, 3mm granuloma LLL, NAD.Marland KitchenMarland Kitchen  EKG-  SBrady, rate54, wnl, NAD...  LABS 10/14:  FLP- at goals on Simva40;  Chems- wnl;  CBC- wnl;  TSH=1.14;  PSA=1.43...   ~  March 19, 2014:  Yearly ROV & review of medical problems> Russell [name] had his left TKR 11/14 by DrYates then had complic w/ patella dislocation requiring a se4cond surg by DrDuda 2 wks later; this was followed by rehab & now back to normal & he is very pleased- fully recovered, playing golf, etc... 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 We reviewed the following medical problems during today's office visit >>     Smoker & Abn CXR> he smokes cigars on the golf course & has a sm LLL granuloma on CXR; he remains asymptomatic w/o cough, sput, SOB, CP, etc...    CHOL> on Simva40; prev LDL as high as 150 range on diet alone; FLP 10/15 shows TChol 150, TG 53, HDL 56, LDL  83    GI- divertics, colon polyps & hems> grandfather had colon cancer; pt noted rectal bleeding 5/14 & saw DrPerry in f/u> colonoscopy done 6/14 w/ divertics, hems, & 4 polyps- tubulovillous adenomas & f/u planned 3-60yrs    GU- hx prostatitis & kid stones> had stone 2012 able to pass it; Urology eval by DrOttelin & diet adjust; no recurrent problem since then...    DJD> he has seen DrYates in the past & had left knee arthroscopy; finally had left TKR 40/10 w/ complic (patella dislocation) requiring surg 2wks later; s/p rehab & back to normal now...    Hx skin cancer> his friend and business partner died from metastatic melanoma; pt has DrHall check his skin every 97mo...    Blood donor> his has donated over 150 pts he says; continues to donate regularly; labs 10/14 showed Hg= 15.3 We reviewed prob list, meds, xrays and labs> see below for updates >> he will get Flu shot at work in 2 wks...   CXR 10/15 showed norm heart size, mild tortuosity of Ao, clear lungs, NAD...  LABS 10/15:  FLP- at goals on Simva40;  Chems- wnl;  CBC- wnl;  TSH=0.85;  PSA=1.73...  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 otherw feeling well- no new complaints or concerns... We reviewed the following medical problems during today's office visit >>     Smoker & Abn CXR> he smokes cigars on the golf course & has a sm LLL granuloma on CXR; he remains asymptomatic w/o cough, sput, SOB, CP, etc...    CHOL> on Simva40; prev LDL as high as 150 range on diet alone; FLP 10/16 shows TChol 159, TG 54, HDL 65, LDL 84    GI- divertics, colon polyps & hems> grandfather had colon cancer; pt noted rectal bleeding 5/14 & saw DrPerry in f/u> colonoscopy done 6/14 w/ divertics, hems, & 4 polyps-  tubulovillous adenomas & f/u planned 3-76yrs    GU- hx prostatitis & kid stones> had stone 2012 able to pass it; Urology eval by DrOttelin & diet adjust; no recurrent problem since then...    DJD> he has seen DrYates in the past & had left knee arthroscopy; finally had left TKR 27/25 w/ complic (patella dislocation) requiring surg 2wks later; s/p rehab &  back to normal now...    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    Blood donor> his has donated over 150 pts he says; continues to donate regularly; labs 10/14 showed Hg= 15.3 EXAM shows Afeb, VSS, O2sat=96% on RA;  Wt=190# stable;  HEENT- neg, mallampati2;  Chest- clear w/o w/r/r;  Heart- RR w/o m/r/g;  Abd- soft, nontender, neg;  Ext- neg w/o c/c/e;  Neuro- intact...   CXR 03/24/15 showed norm heart size, clear lungs, NAD.Marland KitchenMarland Kitchen   EKG 03/24/15 showed SBrady, rate50, WNL, NAD...  LABS 03/2015>  FLP at goals on Simva40;  Chems- wnl;  CBC- wnl;  TSH=1.10;  PSA=1.00  IMP/PLANS>>  Kaysen was given 2016 Flu vaccine & the Prevnar-13 shot today; he will maintain f/u w/ [facility] surgery; continue same meds...  ~  March 23, 2016:  39yr ROV & Harlon returns for a follow up visit, doing satis, no new complaints or concerns; he continues to f/u w/ DERM regularly & recently had a few placed frozen... We reviewed the following medical problems during today's office visit >>     Smoker & Abn CXR> he smokes cigars on the golf course & has a sm LLL granuloma on CXR; he remains asymptomatic w/o cough, sput, SOB, CP, etc...    CHOL> on Simva40; prev LDL as high as 150 range on diet alone; FLP 10/17 shows TChol 164, TG 83, HDL 64, LDL 84    GI- divertics, colon polyps & hems> grandfather had colon cancer; pt noted rectal bleeding 5/14 & saw DrPerry in f/u> colonoscopy done 6/14 w/  divertics, hems, & 4 polyps- tubulovillous adenomas & f/u planned 3-67yrs    GU- hx prostatitis & kid stones> had stone 2012 able to pass it; Urology eval by DrOttelin & diet adjust; no recurrent problem since then...    DJD> he has seen DrYates in the past & had left knee arthroscopy; finally had left TKR 09/32 w/ complic (patella dislocation) requiring surg 2wks later; s/p rehab & back to normal now...    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    Blood donor> his has donated over 150 pts he says; continues to donate regularly; labs 10/14 showed Hg= 15.3 EXAM shows Afeb, VSS, O2sat=96% on RA;  Wt=192# stable;  HEENT- neg, mallampati2;  Chest- clear w/o w/r/r;  Heart- RR w/o m/r/g;  Abd- soft, nontender, neg;  Ext- neg w/o c/c/e;  Neuro- intact...   CXR 03/23/16 showed norm heart size, calcif Ao, clear lungs- NAD...   LABS 03/23/16>  FLP- at goals on Simva40;  Chems- wnl;  CBC- wnl;  TSH=0.78;  PSA=1.45;  VitD=37=> rec 1000u/d supplement... IMP/PLAN>>  Alexys is stable- given 2017 flu vaccine today & rec to continue same + diet & exercise...  ~  November 27, 2016:  39mo ROV & pt requested add-on appt for URI as he is flying to [place] tomorrow on [employer] reports 1 wk hx mild sore throat, hoarseness, congestion, and blowing clear mucus (no color, no blood); he notes sl HA & mild cough w/ small amt beige sput;  He is a cigar  smoker (mostly on the golf course) and has hx abn CXR w/ LLL granuloma, otherw clear;  He also had melanoma surg 12/2014 left post shoulder- followed at [facility]...     EXAM shows Afeb, VSS, O2sat=98% on RA;  Wt=190# stable;  HEENT- sl hoarse, mallampati2;  Chest- clear w/o w/r/r;  Heart- RR w/o m/r/g;  Abd- soft, nontender, neg;  Ext- neg w/o c/c/e;  Neuro- intact...  IMP/PLAN>>  We decided to treat w/  ZPak, Depo80, Pred taper (see AVS), and rec OTC meds for comfort including Delsym, throat lozenges etc...   ~  March 26, 2017:  77mo ROV & time for his annual check up>  Macon reports that his URI responded nicely to Rx & he had a great wine trip to [place] & golf trip to [place];  Feeling well w/o new complaints or concerns- he denies CP, palpit, dizzy, SOB, edema, etc;  BP has been elevated on several recent BP checks- 160/90 today, 146/90 by Payton Mccallum 9/18, 162/98 by Ortho 8/18, etc=> we decided to check 2DEcho for further eval... We reviewed the following medical problems during today's office visit >>     Smoker & Abn CXR> he smokes cigars on the golf course & has a sm LLL granuloma on CXR; he remains asymptomatic w/o cough, sput, SOB, CP, etc...    CHOL> on Simva40; prev LDL as high as 150 range on diet alone; FLP 10/18 shows TChol 172, TG 71, HDL 63, LDL 95    GI- divertics, colon polyps & hems> grandfather had colon cancer; pt noted rectal bleeding 5/14 & saw DrPerry in f/u> colonoscopy done 6/14 w/ divertics, hems, & 4 polyps- tubulovillous adenomas & f/u planned 24yrs; f/u colon done 02/01/16 w/ divertics, hems, no recurrent polyps & f/u planned 5 yrs by DrPerry...    GU- hx prostatitis & kid stones> had stone 2012 able to pass it; Urology eval by DrOttelin & diet adjust; no recurrent problem since then; PSA has been normal...    DJD> he has seen DrYates in the past & had left knee arthroscopy; finally had left TKR 54/65 w/ complic (patella dislocation) requiring surg 2wks later; s/p rehab & back to normal now...    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    Blood donor> his has donated over 150 pts he says; continues to donate but not as regularly; labs 10/18 showed Hg= 16.1 EXAM shows Afeb, Borderline HBP 160/90,  O2sat=99% on RA;  Wt=189# stable;  HEENT- neg, mallampati2;  Chest- clear w/o w/r/r;  Heart- RR w/o m/r/g;  Abd- soft, nontender, neg;  Ext- neg w/o c/c/e;  Neuro- intact...   LABS 03/26/17>  FLP- wnl, all parameters are at goals on Simva40;  Chems- wnl x FBS=106;  CBC- wnl;  TSH=0.52;  PSA=1.61  2DEcho 03/30/17>  Mild LVH w/ norm LVF, EF=60-65%, no regional wall motion abn, Gr1DD, trivAI, sl bowing of MV w/o prolapse & mild MR, mild LAdil at 50mm... IMP/PLAN>>  He has borderline/ mild HBP & 2DEcho shows mild LVH=> rec starting a step one antihypertensive w/ LOSARTAN 50mg  po Qd=> called to [employer];  He is asked to get a BP cuff & start monitoring BP at home- call for any issues;  OK 2018 Flu vaccine today;  His only other prescription med is [place]  looks good- continue same...  ~  July 02, 2017:  70mo ROV & f/u HBP> when last seen in Oct2018 we started Losartan50 for HBP & mild LVH found on his 2DEcho;  He called in Dec w/ BP readings that were still up to 170/100 intermittently on his home checks so we incr the Losartan to 100mg AM & added Amlodipine5PM;  Since then BP readings are improved (160/90 at high in PM readings, AM BPs look pretty good) but not quite where we want them => so we decided to change Losartan to HYZAAR 100-12.5 today... otherw feeling well-- denies HA, visual changes, CP, palpit, dizzy, edema, etc...     We reviewed LABS from 03/2017 and the 2DEcho dated 03/30/17...  EXAM shows Afeb, BP= 140/82, O2sat=96% on RA;  Wt=195# stable;  HEENT- neg, mallampati2;  Chest- clear w/o w/r/r;  Heart- RR w/o m/r/g;  Abd- soft, nontender, neg;  Ext- neg w/o c/c/e;  Neuro- intact...  IMP/PLAN>>  Sheron's BP is still sl elev by his home BP checks esp in the PM readings & we decided to change his Losa100 to HYZAAR 100-12.5 Qam, continue the Amlod5 Qpm & he will cont to monitor BPs at home, call w/ any questions, and f/u 71mo...  ~  September 24, 2017:  3mo ROV & recheck BP>  Now on HYZAAR100-25  (Pharm confirmed 100-25 was called in NOT the 100-12.5) + Amlodipine5 for his BP & home checks have all been <140/80 & he denies HAs, visual symptoms, CP, palpit, dizziness, SOB, or edema... BP here today = 122/64;  He smokes an occas cigar & not motivated to quit (~1 per wk);  We checked BMet today...    EXAM shows Afeb, BP= 122/64, O2sat=97% on RA;  Wt=195# stable;  HEENT- neg, mallampati2;  Chest- clear w/o w/r/r;  Heart- RR w/o m/r/g;  Abd- soft, nontender, neg;  Ext- neg w/o c/c/e;  Neuro- intact...   BMet 09/24/17>  wnl w/ K=4.0, Cr=1.04 IMP/PLAN>>  BP now well controlled, he is on the Hyzaar 100-25 & Amlod5; BMet is wnl-- may need to consider decr diuretic dose in follow up;  We plan ROV in 63mo w/ Fasting blood work...    ~  March 26, 2018:  62mo ROV & general medical follow up visit>               Problem List:   SMOKER (ICD-305.1) - he is a cigar smoker and states that he does not inhale... smokers only occas "when I play golf or go to the bar"... ~  CXR 3/12 showed norm heart size, mild interstitial thickening, 66mm calc granuloma left lung base. ~  CTAbd 7/12 in ER for kidney stone showed incidental mild bullous changes at lung bases & bibasilar atx; also atherosclerotic changes in Ao & branches w/o aneurysm.  ~  CXR 10/13 showed tiny calcif granuloma at left lung base, norm heart size, otherw clear lungs... ~  CXR 10/14 showed normal heart size, clear lungs, 71mm granuloma LLL, NAD ~  CXR 10/15 showed norm heart size, mild tortuosity of Ao, clear lungs, NAD ~  CXR 10/16 showed norm heart size, clear lungs, NAD.Marland Kitchen.  ~  CXR 03/23/16 showed norm heart size, calcif Ao, clear lungs- NAD...   HBP/ Mild LVH on 2DEchocardiogram 03/2017 => BP levated here & at specialist offices over the last yr => check 2DEcho ~  2DEcho 03/30/17 showed mild LVH w/ norm LVF, EF=60-65%, no regional wall motion abn, Gr1DD, trivAI, sl bowing of MV  w/o prolapse & mild MR, mild LAdil at 2mm... ~  Started on  LOSARTAN50=> control was not adeq so dose incr to 100mg  & Amlod5 added... ~  07/02/17> Oden's BP is still sl elev by his home BP checks esp in the PM readings & we decided to change his Losa100 to HYZAAR 100-12.5 Qam, continue the Amlod5 Qpm & he will cont to monitor BPs at home, call w/ any questions, and f/u 88mo.  HYPERCHOLESTEROLEMIA, BORDERLINE (ICD-272.4) - on diet Rx alone & prev not interested in med Rx... now he states he will consider medication if no better on his diet... ~  [place] 11/06 showed TChol 210, Tg 41, HDL 59, LDL 138... pt refers diet Rx. ~  [place] 12/09 showed TChol 219, Tg 43, HDL 53, LDL 141... rec> start Simva but he refuses, prefers diet. ~  FLP 12/10 showed TChol  222, TG 111, HDL 53, LDL 153... rec> start Simva40. ~  [place] 3/11 on Simva40 showed TChol 141, TG 36, HDL 59, LDL 75... continue Simva40. ~  [place] 3/12 on Simva40 showed TChol 157, TG 54, HDL 54, LDL 92 ~  FLP 10/13 on Simva40 showed TChol 163, TG 35, HDL 73, LDL 83 ~  FLP 10/14 on Simva40 showed TChol 149, TG 28, HDL 62, LDL 81  ~  FLP 10/15 on Simva40 showed TChol 150, TG 53, HDL 56, LDL 83 ~  FLP 10/16 on Simva40 showed TChol 159, TG 54, HDL 65, LDL 84 ~  FLP 10/17 on Simva40 showed  TChol 164, TG 83, HDL 64, LDL 84 ~  FLP 10/18 on simva40 showed  TChol 172, TG 71, HDL 63, LDL 95  Hx of COLONIC POLYPS (ICD-211.3), & HEMORRHOIDS (ICD-455.6) - + FamHx colon cancer in his grandfather... last colonoscopy 2/08 by DrPerry showed 86mm polyp & hems... path= polypoid mucosa w/o adenomatous change... f/u planned 71yrs. ~  grandfather had colon cancer; pt noted rectal bleeding 5/14 & saw DrPerry in f/u> colonoscopy done 6/14 w/ divertics, hems, & 4 polyps- tubulovillous adenomas & f/u planned 56yrs ~  Follow up colonoscopy done 02/01/16 w/ divertics, hems, no recurrent polyps & f/u planned 5 yrs by DrPerry  Hx of ACUTE PROSTATITIS (ICD-601.0) - prev eval & Rx by DrWrenn... no symptoms... ~  PSA readings have all been wnl  KIDNEY  STONES >>  ~  New onset problem 7/12 & went to ER for treatment; Rx conservatively & he was able to pass stone; followed up w/ Urology, DrOttelin (we don't have notes) & he reports stone was calc oxalate & rec to avoid colas, tea, etc; increase water intake & use citrate when poss... ~  CTAbd 7/12 showed mild bullous changes at lung bases & atx, +gallstones w/o inflamm, mild right hydronephrosis & hydroureter/ 62mm right UVJ stone; mild prostatic enlargement, multilevel DDD in spine, ateriosclerotic calcif in Ao & branches... ~  XRay Abd in ER 8/12 showed mod stool burden, mild lumbar spondylosis, NAD... ~  No known recurrence of stone disease to date...  DEGENERATIVE JOINT DISEASE (ICD-715.90) - he had a prev left knee arthroscopy by DrYates in 2008... he take 3 ADVIL prior to golfing... ~  10/14: states he was prev told bone on bone in knee & he'd need TKR per DrYates=> done 78/29 w/ complic (patella dislocation) requiring surg 2wks later; s/p rehab & back to normal now...  SKIN CANCER, HX OF (ICD-V10.83) - he sees DrHall for skin check every 6 months... his friend and business partner died of  metastatic melanoma in the past... ~  [id number]:  DrHall found MELANOMA on left post shoulder- referred to Surg Oncology DrOllila at [facility] for wider excision, mapping, & sentinel node Bx- margins clear & LN was neg; they continue to follow...  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 ~  labs 12/09 showed Hg= 15.7 ~  labs 12/10 showed Hg= 16.6 ~  Labs 3/12 showed Hg= 16.1 ~  Labs 10/13 showed Hg= 15.0 ~  Labs 10/14 showed Hg= 15.3 ~  Labs 10/15 showed Hg= 15.5 ~  Labs 10/16 showed Hg= 15.9 ~  Labs 10/17 showed Hg= 15.3  HEALTH MAINTENANCE:  he takes ASA 81mg  on MWF since he has epistaxis on daily ASA early in 2009... ~  GI:  DrPerry & up to date on screening colonoscopy... ~  GU:  DrWrenn in the past... PSA 10/14 = 1.43 ~  Immuniz:  He is up to date on  all indicated vaccinations...   Past Medical History:  Diagnosis Date  . Acute prostatitis   . Arthritis    hands  . Cancer ([place])    basal cell removed  . Dislocated patella 04/2013   S/P   ARTHROPLASTY  . Hemorrhoids   . History of skin cancer   . Hx of colonic polyps   . Kidney stone    passed stone - no surgery required  . Other and unspecified hyperlipidemia   . Smoker   . Whole blood donor     Past Surgical History:  Procedure Laterality Date  . COLONOSCOPY    . EYE SURGERY    . KNEE ARTHROPLASTY Left 04/30/2013   Procedure: COMPUTER ASSISTED TOTAL KNEE ARTHROPLASTY;  Surgeon: Marybelle Killings, MD;  Location: [place];  Service: Orthopedics;  Laterality: Left;  Left Cemented Total Knee Arthroplasty  . 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 Isaiah Blakes at [facility]  . left knee arthroscopy  03/2007  . PATELLAR TENDON REPAIR Left 05/14/2013   Procedure: Left Total Knee Arthroplasty Medial Retinaculum Repair;  Surgeon: Marybelle Killings, MD;  Location: [place];  Service: Orthopedics;  Laterality: Left;  Left Total Knee Arthroplasty Medial Retinaculum Repair  . POLYPECTOMY    . skin cancer removed Left 2016   shoulder   . skin cancer removed N/A 02/22/2016   from top of his head.  this was done at [facility]  . TONSILLECTOMY AND ADENOIDECTOMY      Outpatient Encounter Medications as of 03/26/2018  Medication Sig  . amLODipine (NORVASC) 5 MG tablet TAKE ONE TABLET DAILY  . aspirin 81 MG tablet 1 tablet by mouth three times per week.  . losartan-hydrochlorothiazide (HYZAAR) 100-25 MG tablet Take 1 tablet by mouth daily.  . simvastatin (ZOCOR) 40 MG tablet Take 1 tablet (40 mg total) by mouth every evening.  . [DISCONTINUED] Multiple Vitamins-Minerals (MULTIVITAMIN ADULTS 50+ PO) Take 1 tablet by mouth daily.   No facility-administered encounter medications on file as of 03/26/2018.     No Known Allergies   Immunization History  Administered Date(s) Administered  . Influenza  Whole 02/25/2010, 02/18/2012  . Influenza, High Dose Seasonal PF 04/01/2014, 03/23/2016, 03/19/2017, 03/26/2018  . Influenza,inj,Quad PF,6+ Mos 03/24/2015  . Influenza-Unspecified 02/17/2013  . Pneumococcal Polysaccharide-23 03/19/2013    Current Medications, Allergies, Past Medical History, Past Surgical History, Family History, and Social History were reviewed in [employer] record.   Review of Systems  The patient complains of joint pain and arthritis.  The patient denies fever, chills, sweats, anorexia, fatigue, weakness, malaise, weight loss, sleep disorder, blurring, diplopia, eye irritation, eye discharge, vision loss, eye pain, photophobia, earache, ear discharge, tinnitus, decreased hearing, nasal congestion, nosebleeds, sore throat, hoarseness, chest pain, palpitations, syncope, dyspnea on exertion, orthopnea, PND, peripheral edema, cough, dyspnea at rest, excessive sputum, hemoptysis, wheezing, pleurisy, nausea, vomiting, diarrhea, constipation, change in bowel habits, abdominal pain, melena, hematochezia, jaundice, gas/bloating, indigestion/heartburn, dysphagia, odynophagia, dysuria, hematuria, urinary frequency, urinary hesitancy, nocturia, incontinence, back pain, joint swelling, muscle cramps, muscle weakness, stiffness, sciatica, restless legs, leg pain at night, leg pain with exertion, rash, itching, dryness, suspicious lesions, paralysis, paresthesias, seizures, tremors, vertigo, transient blindness, frequent falls, frequent headaches, difficulty walking, depression, anxiety, memory loss, confusion, cold intolerance, heat intolerance, polydipsia, polyphagia, polyuria, unusual weight change, abnormal bruising, bleeding, enlarged lymph nodes, urticaria, allergic rash, hay fever, and recurrent infections.     Objective:   Physical Exam    WD, WN, 72 y/o WM in NAD...  GENERAL:  Alert & oriented; pleasant & cooperative... HEENT:  [place]/AT, EOM-wnl, PERRLA,  EACs-clear, TMs-wnl, NOSE-clear, THROAT-clear & wnl. NECK:  Supple w/ full ROM; no JVD; normal carotid impulses w/o bruits; no thyromegaly or nodules palpated; no lymphadenopathy. CHEST:  Clear to P & A; without wheezes/ rales/ or rhonchi. HEART:  Regular Rhythm; without murmurs/ rubs/ or gallops. ABDOMEN:  Soft & nontender; normal bowel sounds; no organomegaly or masses detected. RECTAL:  Neg - prostate 2+ & nontender w/o nodules; stool hematest neg. EXT: without deformities, mild arthritic changes; no varicose veins/ venous insuffic/ or edema. NEURO:  CN's intact; motor testing normal; sensory testing normal; gait normal & balance OK. DERM:  No lesions noted; no rash etc...  RADIOLOGY DATA:  Reviewed in the EPIC EMR & discussed w/ the patient...  LABORATORY DATA:  Reviewed in the EPIC EMR & discussed w/ the patient...   Assessment:      HBP >>  03/26/17>  He has borderline/ mild HBP & 2DEcho shows mild LVH=> rec starting a step one antihypertensive w/ LOSARTAN 50mg  po Qd=> called to [employer] [facility];  He is asked to get a BP cuff & start monitoring BP at home- call for any issues;  OK 2018 Flu vaccine today;  His only other prescription med is [place] looks good- continue same. 07/02/17>   Kiren's BP is still sl elev by his home BP checks esp in the PM readings & we decided to change his Losa50 to HYZAAR 100-12.5 Qam, continue the Amlod5 Qpm & he will cont to monitor BPs at home, call w/ any questions, and f/u 68mo.. 09/24/17>    BP now well controlled, he is on the Hyzaar 100-25 & Amlod5; BMet is wnl-- may need to consider decr diuretic dose in follow up;  We plan ROV in 13mo w/ Fasting blood work.   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 11/27/16>  Jkwon presents w/ URI, HA, sinus drainage, hoarseness, clear sput- we decided to treat w/ ZPak, Depo80, Pred taper (see AVS), and  rec OTC meds for comfort including Delsym, throat lozenges etc...   CHOL>  FLP looks good on Simva40; continue same + diet...  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  Hx Kid Stones & Prostatitis in past>  No recurrent kid stones or prostate symptoms; PSA= 1.43  DJD>  Followed by Avel Peace; s/p left TKR & doing well...  Skin Ca>  Followed by Natale Milch & pt reports doing OK...  ~  [id number]: Dx w/ melanoma left post shoulder area- removed by [facility], referred to [facility] for wide excision, mapping, & sentine node bx=> all neg & they continue to follow.  Whole Blood Donor>  A+ blood type & has given ~150 pints to date...     Plan:     Patient's Medications  New Prescriptions   No medications on file  Previous Medications   AMLODIPINE (NORVASC) 5 MG TABLET    TAKE ONE TABLET DAILY   ASPIRIN 81 MG TABLET    1 tablet by mouth three times per week.   LOSARTAN-HYDROCHLOROTHIAZIDE (HYZAAR) 100-25 MG TABLET    Take 1 tablet by mouth daily.   SIMVASTATIN (ZOCOR) 40 MG TABLET    Take 1 tablet (40 mg total) by mouth every evening.  Modified Medications   No medications on file  Discontinued Medications   MULTIPLE VITAMINS-MINERALS (MULTIVITAMIN ADULTS 50+ PO)    Take 1 tablet by mouth daily.

## 2018-04-04 ENCOUNTER — Telehealth: Payer: Self-pay | Admitting: Internal Medicine

## 2018-04-04 NOTE — Telephone Encounter (Signed)
Closed out by mistake.

## 2018-04-04 NOTE — Telephone Encounter (Signed)
Appointment has been made for 06/24/2018.

## 2018-04-04 NOTE — Telephone Encounter (Signed)
Yes, I will see him.

## 2018-04-04 NOTE — Telephone Encounter (Signed)
Copied from [place] [phone]. Topic: General - Other >> Apr 04, 2018  1:01 PM Janace Aris A wrote: Reason for CRM: Pt's wife called in. She would like to see if the pt can have approval to be seen by Dr. Scarlette Calico, he is a current pt of Dr. Lenna Gilford for over 40 yrs, but he is retiring soon and the pt would like to stay in the [facility] primary care system.     Please advise if you approve, Thank you.

## 2018-05-14 ENCOUNTER — Telehealth: Payer: Self-pay | Admitting: Pulmonary Disease

## 2018-05-14 NOTE — Telephone Encounter (Signed)
Called and spoke with Patient.  Patient is requesting pneumonia vaccine.  Per last OV, 03/26/18, Patient was to return for Prevnar 13 vaccine.  Patient scheduled for Prevnar 13, 05/20/18, at 0930.  Patient is aware of new location.  Nothing further at this time.

## 2018-05-20 ENCOUNTER — Ambulatory Visit (INDEPENDENT_AMBULATORY_CARE_PROVIDER_SITE_OTHER): Payer: Medicare Other

## 2018-05-20 DIAGNOSIS — Z23 Encounter for immunization: Secondary | ICD-10-CM | POA: Diagnosis not present

## 2018-06-24 ENCOUNTER — Encounter: Payer: Self-pay | Admitting: Internal Medicine

## 2018-06-24 ENCOUNTER — Ambulatory Visit (INDEPENDENT_AMBULATORY_CARE_PROVIDER_SITE_OTHER): Payer: Medicare Other | Admitting: Internal Medicine

## 2018-06-24 ENCOUNTER — Other Ambulatory Visit (INDEPENDENT_AMBULATORY_CARE_PROVIDER_SITE_OTHER): Payer: Medicare Other

## 2018-06-24 VITALS — BP 138/82 | HR 60 | Temp 97.7°F | Resp 16 | Ht 72.0 in | Wt 188.5 lb

## 2018-06-24 DIAGNOSIS — I10 Essential (primary) hypertension: Secondary | ICD-10-CM

## 2018-06-24 DIAGNOSIS — Z1159 Encounter for screening for other viral diseases: Secondary | ICD-10-CM | POA: Diagnosis not present

## 2018-06-24 LAB — BASIC METABOLIC PANEL
BUN: 18 mg/dL (ref 6–23)
CHLORIDE: 104 meq/L (ref 96–112)
CO2: 26 meq/L (ref 19–32)
CREATININE: 0.86 mg/dL (ref 0.40–1.50)
Calcium: 9.6 mg/dL (ref 8.4–10.5)
GFR: 93.34 mL/min (ref >60.00)
GLUCOSE: 107 mg/dL — AB (ref 70–99)
POTASSIUM: 3.8 meq/L (ref 3.5–5.1)
SODIUM: 139 meq/L (ref 135–145)

## 2018-06-24 NOTE — Patient Instructions (Signed)

## 2018-06-24 NOTE — Progress Notes (Signed)
Subjective:  Patient ID: Russell [name], male    DOB: 28-Jan-1946  Age: 73 y.o. MRN: [id number]  CC: Hypertension  NEW TO ME  HPI Russell [name] presents for a BP check - He walks 18 holes of golf several times a week and does not experience CP, DOE, palpitations, edema, or fatigue.  He tells me his blood pressure has been well controlled.  Outpatient Medications Prior to Visit  Medication Sig Dispense Refill  . amLODipine (NORVASC) 5 MG tablet TAKE ONE TABLET DAILY 30 tablet 11  . aspirin 81 MG tablet 1 tablet by mouth three times per week.    . losartan-hydrochlorothiazide (HYZAAR) 100-25 MG tablet Take 1 tablet by mouth daily. 30 tablet 11  . simvastatin (ZOCOR) 40 MG tablet Take 1 tablet (40 mg total) by mouth every evening. 90 tablet 2   No facility-administered medications prior to visit.     ROS Review of Systems  Constitutional: Negative for diaphoresis, fatigue and unexpected weight change.  HENT: Negative.   Eyes: Negative for visual disturbance.  Respiratory: Negative for cough, chest tightness, shortness of breath and wheezing.   Gastrointestinal: Negative for abdominal pain, constipation, diarrhea, nausea and vomiting.  Endocrine: Negative.   Genitourinary: Negative.  Negative for difficulty urinating.  Musculoskeletal: Negative.  Negative for arthralgias and myalgias.  Skin: Negative.  Negative for color change and rash.  Neurological: Negative.  Negative for dizziness, weakness, light-headedness and numbness.  Hematological: Negative for adenopathy. Does not bruise/bleed easily.  Psychiatric/Behavioral: Negative.     Objective:  BP 138/82 (BP Location: Left Arm, Patient Position: Sitting, Cuff Size: Normal)   Pulse 60   Temp 97.7 F (36.5 C) (Oral)   Resp 16   Ht 6' (1.829 m)   Wt 188 lb 8 oz (85.5 kg)   SpO2 97%   BMI 25.57 kg/m   BP Readings from Last 3 Encounters:  06/24/18 138/82  03/26/18 112/66  09/24/17 122/64    Wt Readings from Last 3  Encounters:  06/24/18 188 lb 8 oz (85.5 kg)  03/26/18 191 lb 9.6 oz (86.9 kg)  09/24/17 194 lb 12.8 oz (88.4 kg)    Physical Exam Vitals signs reviewed.  Constitutional:      Appearance: Normal appearance.  HENT:     Nose: Nose normal. No congestion or rhinorrhea.     Mouth/Throat:     Mouth: Mucous membranes are moist.     Pharynx: Oropharynx is clear. No oropharyngeal exudate or posterior oropharyngeal erythema.  Eyes:     General: No scleral icterus.    Conjunctiva/sclera: Conjunctivae normal.  Neck:     Musculoskeletal: Normal range of motion and neck supple. No muscular tenderness.  Cardiovascular:     Rate and Rhythm: Normal rate and regular rhythm.     Heart sounds: No murmur. No gallop.   Pulmonary:     Effort: Pulmonary effort is normal.     Breath sounds: No stridor. No wheezing, rhonchi or rales.  Abdominal:     General: Bowel sounds are normal.     Palpations: There is no mass.     Tenderness: There is no abdominal tenderness. There is no guarding.     Hernia: No hernia is present.  Musculoskeletal: Normal range of motion.        General: No swelling.     Right lower leg: No edema.     Left lower leg: No edema.  Skin:    General: Skin is warm and dry.  Coloration: Skin is not jaundiced.     Findings: No erythema or rash.  Neurological:     General: No focal deficit present.     Mental Status: He is alert and oriented to person, place, and time. Mental status is at baseline.     Lab Results  Component Value Date   WBC 7.3 03/26/2018   HGB 13.4 03/26/2018   HCT 39.7 03/26/2018   PLT 205.0 03/26/2018   GLUCOSE 107 (H) 06/24/2018   CHOL 138 03/26/2018   TRIG 55.0 03/26/2018   HDL 53.20 03/26/2018   LDLDIRECT 153.3 05/31/2009   LDLCALC 73 03/26/2018   ALT 16 03/26/2018   AST 19 03/26/2018   NA 139 06/24/2018   K 3.8 06/24/2018   CL 104 06/24/2018   CREATININE 0.86 06/24/2018   BUN 18 06/24/2018   CO2 26 06/24/2018   TSH 0.71 03/26/2018   PSA  1.12 03/26/2018   INR 1.00 05/14/2013    Dg Chest 2 View  Result Date: 03/26/2018 CLINICAL DATA:  Hypertension.  No chest complaints. EXAM: CHEST - 2 VIEW COMPARISON:  Chest x-ray dated March 23, 2016. FINDINGS: The heart size and mediastinal contours are within normal limits. Both lungs are clear. The visualized skeletal structures are unremarkable. IMPRESSION: No active cardiopulmonary disease. Electronically Signed   By: Titus Dubin M.D.   On: 03/26/2018 15:15    Assessment & Plan:   Russell [name] was seen today for hypertension.  Diagnoses and all orders for this visit:  Need for hepatitis C screening test -     Hepatitis C antibody; Future  Essential hypertension- His blood pressure is adequately well controlled.  Electrolytes and renal function are normal.  Will continue the current regimen. -     Basic metabolic panel; Future   I am having Russell T. Lovena Le "[name]" maintain his aspirin, losartan-hydrochlorothiazide, amLODipine, and simvastatin.  No orders of the defined types were placed in this encounter.    Follow-up: Return in about 6 months (around 12/23/2018).  Scarlette Calico, MD

## 2018-06-25 ENCOUNTER — Encounter: Payer: Self-pay | Admitting: Internal Medicine

## 2018-06-25 LAB — HEPATITIS C ANTIBODY
HEP C AB: NONREACTIVE
SIGNAL TO CUT-OFF: 0.03 (ref <1.00)

## 2018-06-26 DIAGNOSIS — L859 Epidermal thickening, unspecified: Secondary | ICD-10-CM | POA: Diagnosis not present

## 2018-06-26 DIAGNOSIS — Z1283 Encounter for screening for malignant neoplasm of skin: Secondary | ICD-10-CM | POA: Diagnosis not present

## 2018-06-26 DIAGNOSIS — B078 Other viral warts: Secondary | ICD-10-CM | POA: Diagnosis not present

## 2018-06-26 DIAGNOSIS — L570 Actinic keratosis: Secondary | ICD-10-CM | POA: Diagnosis not present

## 2018-06-26 DIAGNOSIS — D2361 Other benign neoplasm of skin of right upper limb, including shoulder: Secondary | ICD-10-CM | POA: Diagnosis not present

## 2018-06-26 DIAGNOSIS — X32XXXD Exposure to sunlight, subsequent encounter: Secondary | ICD-10-CM | POA: Diagnosis not present

## 2018-06-26 DIAGNOSIS — D225 Melanocytic nevi of trunk: Secondary | ICD-10-CM | POA: Diagnosis not present

## 2018-06-26 DIAGNOSIS — Z85820 Personal history of malignant melanoma of skin: Secondary | ICD-10-CM | POA: Diagnosis not present

## 2018-06-26 DIAGNOSIS — Z08 Encounter for follow-up examination after completed treatment for malignant neoplasm: Secondary | ICD-10-CM | POA: Diagnosis not present

## 2018-07-05 ENCOUNTER — Other Ambulatory Visit: Payer: Self-pay | Admitting: Pulmonary Disease

## 2018-08-12 ENCOUNTER — Other Ambulatory Visit: Payer: Self-pay | Admitting: Pulmonary Disease

## 2018-08-14 ENCOUNTER — Other Ambulatory Visit: Payer: Self-pay | Admitting: Internal Medicine

## 2018-09-05 ENCOUNTER — Other Ambulatory Visit: Payer: Self-pay | Admitting: Internal Medicine

## 2018-09-05 ENCOUNTER — Other Ambulatory Visit: Payer: Self-pay | Admitting: Pulmonary Disease

## 2018-09-05 DIAGNOSIS — I517 Cardiomegaly: Secondary | ICD-10-CM

## 2018-09-05 DIAGNOSIS — I10 Essential (primary) hypertension: Secondary | ICD-10-CM

## 2018-09-05 MED ORDER — AMLODIPINE BESYLATE 5 MG PO TABS: 5.0000 mg | ORAL_TABLET | Freq: Every day | ORAL | 1 refills | Status: DC

## 2018-09-17 DIAGNOSIS — D044 Carcinoma in situ of skin of scalp and neck: Secondary | ICD-10-CM | POA: Diagnosis not present

## 2018-09-17 DIAGNOSIS — X32XXXD Exposure to sunlight, subsequent encounter: Secondary | ICD-10-CM | POA: Diagnosis not present

## 2018-09-17 DIAGNOSIS — L570 Actinic keratosis: Secondary | ICD-10-CM | POA: Diagnosis not present

## 2018-10-29 DIAGNOSIS — Z08 Encounter for follow-up examination after completed treatment for malignant neoplasm: Secondary | ICD-10-CM | POA: Diagnosis not present

## 2018-10-29 DIAGNOSIS — Z85828 Personal history of other malignant neoplasm of skin: Secondary | ICD-10-CM | POA: Diagnosis not present

## 2018-10-29 DIAGNOSIS — C44519 Basal cell carcinoma of skin of other part of trunk: Secondary | ICD-10-CM | POA: Diagnosis not present

## 2018-12-27 DIAGNOSIS — H6123 Impacted cerumen, bilateral: Secondary | ICD-10-CM | POA: Diagnosis not present

## 2019-02-05 DIAGNOSIS — L821 Other seborrheic keratosis: Secondary | ICD-10-CM | POA: Diagnosis not present

## 2019-02-05 DIAGNOSIS — X32XXXD Exposure to sunlight, subsequent encounter: Secondary | ICD-10-CM | POA: Diagnosis not present

## 2019-02-05 DIAGNOSIS — L905 Scar conditions and fibrosis of skin: Secondary | ICD-10-CM | POA: Diagnosis not present

## 2019-02-05 DIAGNOSIS — C44519 Basal cell carcinoma of skin of other part of trunk: Secondary | ICD-10-CM | POA: Diagnosis not present

## 2019-02-05 DIAGNOSIS — D485 Neoplasm of uncertain behavior of skin: Secondary | ICD-10-CM | POA: Diagnosis not present

## 2019-02-05 DIAGNOSIS — L570 Actinic keratosis: Secondary | ICD-10-CM | POA: Diagnosis not present

## 2019-02-05 DIAGNOSIS — Z08 Encounter for follow-up examination after completed treatment for malignant neoplasm: Secondary | ICD-10-CM | POA: Diagnosis not present

## 2019-02-05 DIAGNOSIS — Z85820 Personal history of malignant melanoma of skin: Secondary | ICD-10-CM | POA: Diagnosis not present

## 2019-02-05 DIAGNOSIS — Z1283 Encounter for screening for malignant neoplasm of skin: Secondary | ICD-10-CM | POA: Diagnosis not present

## 2019-02-12 DIAGNOSIS — Z03818 Encounter for observation for suspected exposure to other biological agents ruled out: Secondary | ICD-10-CM | POA: Diagnosis not present

## 2019-03-06 ENCOUNTER — Encounter: Payer: Self-pay | Admitting: Internal Medicine

## 2019-03-09 ENCOUNTER — Other Ambulatory Visit: Payer: Self-pay | Admitting: Internal Medicine

## 2019-03-09 DIAGNOSIS — I10 Essential (primary) hypertension: Secondary | ICD-10-CM

## 2019-03-09 DIAGNOSIS — I517 Cardiomegaly: Secondary | ICD-10-CM

## 2019-03-14 DIAGNOSIS — Z08 Encounter for follow-up examination after completed treatment for malignant neoplasm: Secondary | ICD-10-CM | POA: Diagnosis not present

## 2019-03-14 DIAGNOSIS — Z85828 Personal history of other malignant neoplasm of skin: Secondary | ICD-10-CM | POA: Diagnosis not present

## 2019-03-14 DIAGNOSIS — L905 Scar conditions and fibrosis of skin: Secondary | ICD-10-CM | POA: Diagnosis not present

## 2019-03-31 ENCOUNTER — Encounter: Payer: Self-pay | Admitting: Internal Medicine

## 2019-03-31 ENCOUNTER — Other Ambulatory Visit: Payer: Self-pay

## 2019-03-31 ENCOUNTER — Other Ambulatory Visit (INDEPENDENT_AMBULATORY_CARE_PROVIDER_SITE_OTHER): Payer: Medicare Other

## 2019-03-31 ENCOUNTER — Ambulatory Visit (INDEPENDENT_AMBULATORY_CARE_PROVIDER_SITE_OTHER): Payer: Medicare Other | Admitting: Internal Medicine

## 2019-03-31 VITALS — BP 116/66 | HR 52 | Temp 98.3°F | Resp 16 | Ht 72.0 in | Wt 187.0 lb

## 2019-03-31 DIAGNOSIS — I10 Essential (primary) hypertension: Secondary | ICD-10-CM

## 2019-03-31 DIAGNOSIS — N138 Other obstructive and reflux uropathy: Secondary | ICD-10-CM | POA: Diagnosis not present

## 2019-03-31 DIAGNOSIS — E7800 Pure hypercholesterolemia, unspecified: Secondary | ICD-10-CM

## 2019-03-31 DIAGNOSIS — N401 Enlarged prostate with lower urinary tract symptoms: Secondary | ICD-10-CM | POA: Diagnosis not present

## 2019-03-31 DIAGNOSIS — Z23 Encounter for immunization: Secondary | ICD-10-CM

## 2019-03-31 DIAGNOSIS — I517 Cardiomegaly: Secondary | ICD-10-CM | POA: Diagnosis not present

## 2019-03-31 LAB — CBC WITH DIFFERENTIAL/PLATELET
Basophils Absolute: 0.1 10*3/uL (ref 0.0–0.1)
Basophils Relative: 0.9 % (ref 0.0–3.0)
Eosinophils Absolute: 0.5 10*3/uL (ref 0.0–0.7)
Eosinophils Relative: 6.6 % — ABNORMAL HIGH (ref 0.0–5.0)
HCT: 44.5 % (ref 39.0–52.0)
Hemoglobin: 15 g/dL (ref 13.0–17.0)
Lymphocytes Relative: 25.5 % (ref 12.0–46.0)
Lymphs Abs: 2.1 10*3/uL (ref 0.7–4.0)
MCHC: 33.8 g/dL (ref 30.0–36.0)
MCV: 97 fl (ref 78.0–100.0)
Monocytes Absolute: 0.5 10*3/uL (ref 0.1–1.0)
Monocytes Relative: 6.1 % (ref 3.0–12.0)
Neutro Abs: 4.9 10*3/uL (ref 1.4–7.7)
Neutrophils Relative %: 60.9 % (ref 43.0–77.0)
Platelets: 208 10*3/uL (ref 150.0–400.0)
RBC: 4.58 Mil/uL (ref 4.22–5.81)
RDW: 12.9 % (ref 11.5–15.5)
WBC: 8.1 10*3/uL (ref 4.0–10.5)

## 2019-03-31 LAB — BASIC METABOLIC PANEL
BUN: 22 mg/dL (ref 6–23)
CO2: 27 mEq/L (ref 19–32)
Calcium: 9.8 mg/dL (ref 8.4–10.5)
Chloride: 106 mEq/L (ref 96–112)
Creatinine, Ser: 0.94 mg/dL (ref 0.40–1.50)
GFR: 79.08 mL/min (ref >60.00)
Glucose, Bld: 104 mg/dL — ABNORMAL HIGH (ref 70–99)
Potassium: 4.4 mEq/L (ref 3.5–5.1)
Sodium: 141 mEq/L (ref 135–145)

## 2019-03-31 LAB — URINALYSIS, ROUTINE W REFLEX MICROSCOPIC
Bilirubin Urine: NEGATIVE
Hgb urine dipstick: NEGATIVE
Ketones, ur: NEGATIVE
Leukocytes,Ua: NEGATIVE
Nitrite: NEGATIVE
RBC / HPF: NONE SEEN (ref 0–?)
Specific Gravity, Urine: 1.02 (ref 1.000–1.030)
Total Protein, Urine: NEGATIVE
Urine Glucose: NEGATIVE
Urobilinogen, UA: 0.2 (ref 0.0–1.0)
WBC, UA: NONE SEEN (ref 0–?)
pH: 6 (ref 5.0–8.0)

## 2019-03-31 LAB — HEPATIC FUNCTION PANEL
ALT: 18 U/L (ref 0–53)
AST: 17 U/L (ref 0–37)
Albumin: 4.4 g/dL (ref 3.5–5.2)
Alkaline Phosphatase: 55 U/L (ref 39–117)
Bilirubin, Direct: 0.1 mg/dL (ref 0.0–0.3)
Total Bilirubin: 0.7 mg/dL (ref 0.2–1.2)
Total Protein: 6.9 g/dL (ref 6.0–8.3)

## 2019-03-31 LAB — PSA: PSA: 1.54 ng/mL (ref 0.10–4.00)

## 2019-03-31 LAB — LIPID PANEL
Cholesterol: 153 mg/dL (ref 0–200)
HDL: 52.1 mg/dL (ref >39.00)
LDL Cholesterol: 78 mg/dL (ref 0–99)
NonHDL: 100.74
Total CHOL/HDL Ratio: 3
Triglycerides: 116 mg/dL (ref 0.0–149.0)
VLDL: 23.2 mg/dL (ref 0.0–40.0)

## 2019-03-31 LAB — TSH: TSH: 1.26 u[IU]/mL (ref 0.35–4.50)

## 2019-03-31 NOTE — Patient Instructions (Signed)

## 2019-03-31 NOTE — Progress Notes (Signed)
Subjective:  Patient ID: Russell [name], male    DOB: [birth year]  Age: 73 y.o. MRN: [id number]  CC: Hypertension   HPI [name] presents for f/up - He feels well and offers no complaints.  He has been recording his pulse and blood pressure over the last few months and has noticed that both are low.  He denies palpitations, near syncope, dizziness, lightheadedness, chest pain, shortness of breath, edema, or fatigue.  Outpatient Medications Prior to Visit  Medication Sig Dispense Refill  . aspirin 81 MG tablet 1 tablet by mouth three times per week.    . losartan-hydrochlorothiazide (HYZAAR) 100-25 MG tablet TAKE ONE TABLET DAILY 90 tablet 3  . simvastatin (ZOCOR) 40 MG tablet Take 1 tablet (40 mg total) by mouth every evening. 90 tablet 2  . amLODipine (NORVASC) 5 MG tablet TAKE ONE TABLET EACH DAY 90 tablet 0   No facility-administered medications prior to visit.     ROS Review of Systems  Constitutional: Negative for diaphoresis and fatigue.  HENT: Negative.   Eyes: Negative for visual disturbance.  Respiratory: Negative for cough, chest tightness, shortness of breath and wheezing.   Cardiovascular: Negative for chest pain, palpitations and leg swelling.  Gastrointestinal: Negative for abdominal pain, diarrhea, nausea and vomiting.  Endocrine: Negative.   Genitourinary: Positive for difficulty urinating. Negative for dysuria, hematuria, testicular pain and urgency.       He has nocturia about 1-2 times a night and complains of weak urine stream but he does not strain or dribble.  Musculoskeletal: Negative.  Negative for arthralgias and myalgias.  Skin: Negative for color change, pallor and rash.  Neurological: Negative.  Negative for dizziness, weakness and light-headedness.  Hematological: Negative for adenopathy. Does not bruise/bleed easily.  Psychiatric/Behavioral: Negative.     Objective:  BP 116/66 (BP Location: Left Arm, Patient Position: Sitting, Cuff Size:  Normal)   Pulse (!) 52   Temp 98.3 F (36.8 C) (Oral)   Resp 16   Ht 6' (1.829 m)   Wt 187 lb (84.8 kg)   SpO2 97%   BMI 25.36 kg/m   BP Readings from Last 3 Encounters:  03/31/19 116/66  06/24/18 138/82  03/26/18 112/66    Wt Readings from Last 3 Encounters:  03/31/19 187 lb (84.8 kg)  06/24/18 188 lb 8 oz (85.5 kg)  03/26/18 191 lb 9.6 oz (86.9 kg)    Physical Exam Vitals signs reviewed.  Constitutional:      Appearance: Normal appearance.  HENT:     Nose: Nose normal.     Mouth/Throat:     Mouth: Mucous membranes are moist.  Eyes:     General: No scleral icterus.    Conjunctiva/sclera: Conjunctivae normal.  Neck:     Musculoskeletal: Neck supple.  Cardiovascular:     Rate and Rhythm: Regular rhythm. Bradycardia present.     Pulses:          Carotid pulses are 1+ on the right side and 1+ on the left side.      Radial pulses are 1+ on the right side and 1+ on the left side.       Femoral pulses are 1+ on the right side and 1+ on the left side.      Popliteal pulses are 1+ on the right side and 1+ on the left side.       Dorsalis pedis pulses are 1+ on the right side and 1+ on the left side.  Posterior tibial pulses are 1+ on the right side and 1+ on the left side.     Heart sounds: Normal heart sounds. No murmur. No gallop.      Comments: EKG ----  Sinus  Bradycardia  WITHIN NORMAL LIMITS  Pulmonary:     Effort: Pulmonary effort is normal.     Breath sounds: No stridor. No wheezing, rhonchi or rales.  Abdominal:     General: Abdomen is flat. Bowel sounds are normal. There is no distension.     Palpations: There is no hepatomegaly or splenomegaly.     Tenderness: There is no abdominal tenderness.     Hernia: There is no hernia in the left inguinal area or right inguinal area.  Genitourinary:    Pubic Area: No rash.      Penis: Normal and circumcised. No discharge, swelling or lesions.      Scrotum/Testes: Normal.        Right: Mass, tenderness or  swelling not present.        Left: Mass, tenderness or swelling not present.     Epididymis:     Right: Normal. Not inflamed or enlarged. No mass.     Left: Normal. Not inflamed or enlarged. No mass.     Prostate: Enlarged (1+ smooth symm BPH). Not tender and no nodules present.     Rectum: Guaiac result negative. External hemorrhoid present. No mass, tenderness, anal fissure or internal hemorrhoid. Normal anal tone.     Comments: He has uncomplicated external anal hemorrhoids Musculoskeletal:     Right lower leg: No edema.     Left lower leg: No edema.  Lymphadenopathy:     Cervical: No cervical adenopathy.     Lower Body: No right inguinal adenopathy. No left inguinal adenopathy.  Skin:    General: Skin is warm and dry.  Neurological:     General: No focal deficit present.     Mental Status: He is alert.  Psychiatric:        Mood and Affect: Mood normal.        Behavior: Behavior normal.     Lab Results  Component Value Date   WBC 8.1 03/31/2019   HGB 15.0 03/31/2019   HCT 44.5 03/31/2019   PLT 208.0 03/31/2019   GLUCOSE 104 (H) 03/31/2019   CHOL 153 03/31/2019   TRIG 116.0 03/31/2019   HDL 52.10 03/31/2019   LDLDIRECT 153.3 05/31/2009   LDLCALC 78 03/31/2019   ALT 18 03/31/2019   AST 17 03/31/2019   NA 141 03/31/2019   K 4.4 03/31/2019   CL 106 03/31/2019   CREATININE 0.94 03/31/2019   BUN 22 03/31/2019   CO2 27 03/31/2019   TSH 1.26 03/31/2019   PSA 1.54 03/31/2019   INR 1.00 05/14/2013    Dg Chest 2 View  Result Date: 03/26/2018 CLINICAL DATA:  Hypertension.  No chest complaints. EXAM: CHEST - 2 VIEW COMPARISON:  Chest x-ray dated March 23, 2016. FINDINGS: The heart size and mediastinal contours are within normal limits. Both lungs are clear. The visualized skeletal structures are unremarkable. IMPRESSION: No active cardiopulmonary disease. Electronically Signed   By: Titus Dubin M.D.   On: 03/26/2018 15:15    Assessment & Plan:   [name] was seen today  for hypertension.  Diagnoses and all orders for this visit:  Essential hypertension- His blood pressure is over controlled and he is bradycardic.  Fortunately, he is asymptomatic with this.  I have asked him to stop taking the  calcium channel blocker but to stay on the ARB and thiazide diuretic. -     CBC with Differential/Platelet; Future -     Basic metabolic panel; Future -     TSH; Future -     Urinalysis, Routine w reflex microscopic; Future -     EKG 12-Lead  Pure hypercholesterolemia- He has achieved his LDL goal is doing well on the statin. -     Lipid panel; Future -     TSH; Future -     Hepatic function panel; Future  Mild concentric left ventricular hypertrophy (LVH)- He has a normal volume status and his blood pressure is adequately well controlled.  Need for influenza vaccination -     Flu Vaccine QUAD High Dose(Fluad)  BPH with obstruction/lower urinary tract symptoms- He has minimal symptoms and does not want to take a medication to treat this. -     Urinalysis, Routine w reflex microscopic; Future -     PSA; Future   I have discontinued Cassie T. Labrosse "[name]"'s amLODipine. I am also having him maintain his aspirin, simvastatin, and losartan-hydrochlorothiazide.  No orders of the defined types were placed in this encounter.    Follow-up: Return in about 6 months (around 09/29/2019).  Scarlette Calico, MD

## 2019-04-01 DIAGNOSIS — Z6825 Body mass index (BMI) 25.0-25.9, adult: Secondary | ICD-10-CM | POA: Diagnosis not present

## 2019-04-01 DIAGNOSIS — C4362 Malignant melanoma of left upper limb, including shoulder: Secondary | ICD-10-CM | POA: Diagnosis not present

## 2019-04-19 ENCOUNTER — Other Ambulatory Visit: Payer: Self-pay | Admitting: Pulmonary Disease

## 2019-04-25 ENCOUNTER — Other Ambulatory Visit: Payer: Self-pay | Admitting: Internal Medicine

## 2019-04-25 NOTE — Telephone Encounter (Signed)
Medication Refill - Medication:  simvastatin (ZOCOR) 40 MG tablet   Has the patient contacted their pharmacy? Yes advised to call office.   Preferred Pharmacy (with phone number or street name):  [facility], [place] - [address] [phone] (Phone) [phone] (Fax)   Agent: Please be advised that RX refills may take up to 3 business days. We ask that you follow-up with your pharmacy.

## 2019-04-25 NOTE — Telephone Encounter (Signed)
Requested medication (s) are due for refill today: yes  Requested medication (s) are on the active medication list: yes  Last refill:  01/2018  Future visit scheduled: no  Notes to clinic:  Last filled by different provider    Requested Prescriptions  Pending Prescriptions Disp Refills   simvastatin (ZOCOR) 40 MG tablet 90 tablet 2    Sig: Take 1 tablet (40 mg total) by mouth every evening.     Cardiovascular:  Antilipid - Statins Passed - 04/25/2019  2:19 PM      Passed - Total Cholesterol in normal range and within 360 days    Cholesterol  Date Value Ref Range Status  03/31/2019 153 0 - 200 mg/dL Final    Comment:    ATP III Classification       Desirable:  < 200 mg/dL               Borderline High:  200 - 239 mg/dL          High:  > = 240 mg/dL         Passed - LDL in normal range and within 360 days    LDL Cholesterol  Date Value Ref Range Status  03/31/2019 78 0 - 99 mg/dL Final         Passed - HDL in normal range and within 360 days    HDL  Date Value Ref Range Status  03/31/2019 52.10 >39.00 mg/dL Final         Passed - Triglycerides in normal range and within 360 days    Triglycerides  Date Value Ref Range Status  03/31/2019 116.0 0.0 - 149.0 mg/dL Final    Comment:    Normal:  <150 mg/dLBorderline High:  150 - 199 mg/dL         Passed - Patient is not pregnant      Passed - Valid encounter within last 12 months    Recent Outpatient Visits          3 weeks ago Essential hypertension   [place], Thomas L, MD   10 months ago Need for hepatitis C screening test   [facility] -Mayer Camel, MD

## 2019-04-28 MED ORDER — SIMVASTATIN 40 MG PO TABS: 40.0000 mg | ORAL_TABLET | Freq: Every evening | ORAL | 3 refills | Status: DC

## 2019-07-05 ENCOUNTER — Encounter: Payer: Self-pay | Admitting: Internal Medicine

## 2019-07-30 DIAGNOSIS — Z1283 Encounter for screening for malignant neoplasm of skin: Secondary | ICD-10-CM | POA: Diagnosis not present

## 2019-07-30 DIAGNOSIS — D225 Melanocytic nevi of trunk: Secondary | ICD-10-CM | POA: Diagnosis not present

## 2019-07-30 DIAGNOSIS — Z08 Encounter for follow-up examination after completed treatment for malignant neoplasm: Secondary | ICD-10-CM | POA: Diagnosis not present

## 2019-07-30 DIAGNOSIS — X32XXXD Exposure to sunlight, subsequent encounter: Secondary | ICD-10-CM | POA: Diagnosis not present

## 2019-07-30 DIAGNOSIS — Z85820 Personal history of malignant melanoma of skin: Secondary | ICD-10-CM | POA: Diagnosis not present

## 2019-07-30 DIAGNOSIS — L570 Actinic keratosis: Secondary | ICD-10-CM | POA: Diagnosis not present

## 2019-09-01 ENCOUNTER — Other Ambulatory Visit: Payer: Self-pay | Admitting: Internal Medicine

## 2019-11-30 ENCOUNTER — Telehealth: Payer: Self-pay | Admitting: Internal Medicine

## 2019-11-30 DIAGNOSIS — I10 Essential (primary) hypertension: Secondary | ICD-10-CM

## 2019-12-03 MED ORDER — LOSARTAN POTASSIUM-HCTZ 100-25 MG PO TABS: 1.0000 | ORAL_TABLET | Freq: Every day | ORAL | 0 refills | Status: DC

## 2019-12-03 NOTE — Addendum Note (Signed)
Addended by: Aviva Signs M on: 12/03/2019 01:18 PM   Modules accepted: Orders

## 2019-12-03 NOTE — Telephone Encounter (Signed)
   Patient wants to know if losartan-hydrochlorothiazide (HYZAAR) 100-25 MG tablet short supply can be called to pharmacy. Patient scheduled appointment for 6/21

## 2019-12-08 ENCOUNTER — Ambulatory Visit (INDEPENDENT_AMBULATORY_CARE_PROVIDER_SITE_OTHER): Payer: Medicare Other | Admitting: Internal Medicine

## 2019-12-08 ENCOUNTER — Encounter: Payer: Self-pay | Admitting: Internal Medicine

## 2019-12-08 ENCOUNTER — Other Ambulatory Visit: Payer: Self-pay

## 2019-12-08 DIAGNOSIS — I517 Cardiomegaly: Secondary | ICD-10-CM

## 2019-12-08 DIAGNOSIS — I10 Essential (primary) hypertension: Secondary | ICD-10-CM

## 2019-12-08 DIAGNOSIS — Z23 Encounter for immunization: Secondary | ICD-10-CM | POA: Diagnosis not present

## 2019-12-08 LAB — BASIC METABOLIC PANEL
BUN: 25 mg/dL — ABNORMAL HIGH (ref 6–23)
CO2: 26 mEq/L (ref 19–32)
Calcium: 9.4 mg/dL (ref 8.4–10.5)
Chloride: 104 mEq/L (ref 96–112)
Creatinine, Ser: 0.97 mg/dL (ref 0.40–1.50)
GFR: 76.11 mL/min (ref >60.00)
Glucose, Bld: 102 mg/dL — ABNORMAL HIGH (ref 70–99)
Potassium: 3.9 mEq/L (ref 3.5–5.1)
Sodium: 139 mEq/L (ref 135–145)

## 2019-12-08 MED ORDER — LOSARTAN POTASSIUM 100 MG PO TABS: 100.0000 mg | ORAL_TABLET | Freq: Every day | ORAL | 1 refills | Status: DC

## 2019-12-08 NOTE — Patient Instructions (Signed)

## 2019-12-08 NOTE — Progress Notes (Signed)
Subjective:  Patient ID: Russell [name], male    DOB: Nov 19, 1945  Age: 74 y.o. MRN: [id number]  CC: Hypertension  This visit occurred during the SARS-CoV-2 public health emergency.  Safety protocols were in place, including screening questions prior to the visit, additional usage of staff PPE, and extensive cleaning of exam room while observing appropriate contact time as indicated for disinfecting solutions.    HPI CAVION FAIOLA presents for f/up - He tried to donate blood a few weeks ago and was told that his blood pressure was 99/70.  He says he walks 18 holes of golf and does not experience CP, DOE, palpitations, edema, or fatigue.  He denies dizziness or lightheadedness.  Outpatient Medications Prior to Visit  Medication Sig Dispense Refill  . aspirin 81 MG tablet 1 tablet by mouth three times per week.    . simvastatin (ZOCOR) 40 MG tablet Take 1 tablet (40 mg total) by mouth every evening. 90 tablet 3  . losartan-hydrochlorothiazide (HYZAAR) 100-25 MG tablet Take 1 tablet by mouth daily. 30 tablet 0   No facility-administered medications prior to visit.    ROS Review of Systems  Constitutional: Negative.  Negative for appetite change, diaphoresis, fatigue and unexpected weight change.  HENT: Negative.   Eyes: Negative.   Respiratory: Negative for cough, chest tightness, shortness of breath and wheezing.   Cardiovascular: Negative for chest pain, palpitations and leg swelling.  Gastrointestinal: Negative for abdominal pain, diarrhea, nausea and vomiting.  Endocrine: Negative.   Genitourinary: Negative.  Negative for difficulty urinating.  Musculoskeletal: Negative.  Negative for arthralgias and myalgias.  Skin: Negative.  Negative for color change and pallor.  Neurological: Negative.  Negative for dizziness, weakness, light-headedness, numbness and headaches.  Hematological: Negative for adenopathy. Does not bruise/bleed easily.  Psychiatric/Behavioral: Negative.      Objective:  BP 116/72 (BP Location: Left Arm, Patient Position: Sitting, Cuff Size: Normal)   Pulse (!) 56   Temp 97.9 F (36.6 C) (Oral)   Ht 6' (1.829 m)   Wt 188 lb (85.3 kg)   SpO2 97%   BMI 25.50 kg/m   BP Readings from Last 3 Encounters:  12/08/19 116/72  03/31/19 116/66  06/24/18 138/82    Wt Readings from Last 3 Encounters:  12/08/19 188 lb (85.3 kg)  03/31/19 187 lb (84.8 kg)  06/24/18 188 lb 8 oz (85.5 kg)    Physical Exam Vitals reviewed.  Constitutional:      Appearance: Normal appearance.  HENT:     Nose: Nose normal.     Mouth/Throat:     Mouth: Mucous membranes are moist.  Eyes:     General: No scleral icterus.    Conjunctiva/sclera: Conjunctivae normal.  Cardiovascular:     Rate and Rhythm: Normal rate and regular rhythm.     Heart sounds: No murmur heard.   Pulmonary:     Effort: Pulmonary effort is normal.     Breath sounds: No stridor. No wheezing, rhonchi or rales.  Abdominal:     General: Abdomen is flat. Bowel sounds are normal. There is no distension.     Palpations: Abdomen is soft. There is no hepatomegaly, splenomegaly or mass.     Tenderness: There is no abdominal tenderness.  Musculoskeletal:        General: Normal range of motion.     Cervical back: Neck supple.     Right lower leg: No edema.     Left lower leg: No edema.  Lymphadenopathy:  Cervical: No cervical adenopathy.  Skin:    General: Skin is warm and dry.     Coloration: Skin is not pale.  Neurological:     General: No focal deficit present.     Mental Status: He is alert.  Psychiatric:        Mood and Affect: Mood normal.        Behavior: Behavior normal.     Lab Results  Component Value Date   WBC 8.1 03/31/2019   HGB 15.0 03/31/2019   HCT 44.5 03/31/2019   PLT 208.0 03/31/2019   GLUCOSE 102 (H) 12/08/2019   CHOL 153 03/31/2019   TRIG 116.0 03/31/2019   HDL 52.10 03/31/2019   LDLDIRECT 153.3 05/31/2009   LDLCALC 78 03/31/2019   ALT 18  03/31/2019   AST 17 03/31/2019   NA 139 12/08/2019   K 3.9 12/08/2019   CL 104 12/08/2019   CREATININE 0.97 12/08/2019   BUN 25 (H) 12/08/2019   CO2 26 12/08/2019   TSH 1.26 03/31/2019   PSA 1.54 03/31/2019   INR 1.00 05/14/2013    DG Chest 2 View  Result Date: 03/26/2018 CLINICAL DATA:  Hypertension.  No chest complaints. EXAM: CHEST - 2 VIEW COMPARISON:  Chest x-ray dated March 23, 2016. FINDINGS: The heart size and mediastinal contours are within normal limits. Both lungs are clear. The visualized skeletal structures are unremarkable. IMPRESSION: No active cardiopulmonary disease. Electronically Signed   By: Titus Dubin M.D.   On: 03/26/2018 15:15    Assessment & Plan:   Raj was seen today for hypertension.  Diagnoses and all orders for this visit:  Essential hypertension- His blood pressure is somewhat overcontrolled.  Fortunately he is not symptomatic with this.  His BUN is slightly elevated.  I recommended that he discontinue the thiazide diuretic and to stay on the current dose of the ARB. -     Basic metabolic panel; Future -     losartan (COZAAR) 100 MG tablet; Take 1 tablet (100 mg total) by mouth daily. -     Basic metabolic panel  Mild concentric left ventricular hypertrophy (LVH)- See above. -     losartan (COZAAR) 100 MG tablet; Take 1 tablet (100 mg total) by mouth daily.  Need for pneumococcal vaccination -     Pneumococcal polysaccharide vaccine 23-valent greater than or equal to 2yo subcutaneous/IM   I have discontinued Kahlil T. Linan "[name]"'s losartan-hydrochlorothiazide. I am also having him start on losartan. Additionally, I am having him maintain his aspirin and simvastatin.  Meds ordered this encounter  Medications  . losartan (COZAAR) 100 MG tablet    Sig: Take 1 tablet (100 mg total) by mouth daily.    Dispense:  90 tablet    Refill:  1     Follow-up: Return in about 4 months (around 04/08/2020).  Scarlette Calico, MD

## 2019-12-30 DIAGNOSIS — X32XXXD Exposure to sunlight, subsequent encounter: Secondary | ICD-10-CM | POA: Diagnosis not present

## 2019-12-30 DIAGNOSIS — L570 Actinic keratosis: Secondary | ICD-10-CM | POA: Diagnosis not present

## 2019-12-30 DIAGNOSIS — D0472 Carcinoma in situ of skin of left lower limb, including hip: Secondary | ICD-10-CM | POA: Diagnosis not present

## 2020-01-14 ENCOUNTER — Encounter: Payer: Self-pay | Admitting: Internal Medicine

## 2020-01-15 NOTE — Telephone Encounter (Signed)
Called patient. Patient denied chest pain, SOB, any symptoms. States that he feels completely fine/normal. Patient scheduled on Monday 01/19/2020 at 2:40pm.

## 2020-01-19 ENCOUNTER — Other Ambulatory Visit: Payer: Self-pay

## 2020-01-19 ENCOUNTER — Encounter: Payer: Self-pay | Admitting: Internal Medicine

## 2020-01-19 ENCOUNTER — Ambulatory Visit (INDEPENDENT_AMBULATORY_CARE_PROVIDER_SITE_OTHER): Payer: Medicare Other | Admitting: Internal Medicine

## 2020-01-19 VITALS — BP 142/88 | HR 58 | Temp 97.6°F | Wt 194.2 lb

## 2020-01-19 DIAGNOSIS — I10 Essential (primary) hypertension: Secondary | ICD-10-CM

## 2020-01-19 DIAGNOSIS — I517 Cardiomegaly: Secondary | ICD-10-CM

## 2020-01-19 MED ORDER — LOSARTAN POTASSIUM-HCTZ 100-12.5 MG PO TABS: 1.0000 | ORAL_TABLET | Freq: Every day | ORAL | 0 refills | Status: DC

## 2020-01-19 NOTE — Patient Instructions (Signed)

## 2020-01-19 NOTE — Progress Notes (Signed)
Subjective:  Patient ID: Russell [name], male    DOB: 05-Nov-1945  Age: 74 y.o. MRN: [id number]  CC: Hypertension (Pt states since the change on his BP med his BP has been all over the place)  This visit occurred during the SARS-CoV-2 public health emergency.  Safety protocols were in place, including screening questions prior to the visit, additional usage of staff PPE, and extensive cleaning of exam room while observing appropriate contact time as indicated for disinfecting solutions.    HPI Russell [name] presents for f/up - He is concerned that his blood pressure is not adequately well controlled.  He monitors his blood pressure closely and about half of them are above 140/90.  He is asymptomatic with this.  He would like to start taking a thiazide diuretic again.  He has been compliant with the ARB.  Outpatient Medications Prior to Visit  Medication Sig Dispense Refill   aspirin 81 MG tablet 1 tablet by mouth three times per week.     simvastatin (ZOCOR) 40 MG tablet Take 1 tablet (40 mg total) by mouth every evening. 90 tablet 3   losartan (COZAAR) 100 MG tablet Take 1 tablet (100 mg total) by mouth daily. 90 tablet 1   No facility-administered medications prior to visit.    ROS Review of Systems  Constitutional: Negative for diaphoresis and fatigue.  Eyes: Negative for visual disturbance.  Respiratory: Negative for cough, chest tightness and wheezing.   Cardiovascular: Negative for chest pain, palpitations and leg swelling.  Gastrointestinal: Negative for abdominal pain, constipation, diarrhea, nausea and vomiting.  Endocrine: Negative.   Genitourinary: Negative.  Negative for difficulty urinating, dysuria and hematuria.  Musculoskeletal: Negative for arthralgias, myalgias and neck pain.  Skin: Negative.  Negative for pallor and rash.  Neurological: Negative.  Negative for dizziness, weakness, light-headedness and headaches.  Hematological: Negative for adenopathy. Does not  bruise/bleed easily.  Psychiatric/Behavioral: Negative.     Objective:  BP (!) 142/88 (BP Location: Left Arm)    Pulse (!) 58    Temp 97.6 F (36.4 C) (Oral)    Wt 194 lb 3.2 oz (88.1 kg)    SpO2 95%    BMI 26.34 kg/m   BP Readings from Last 3 Encounters:  01/19/20 (!) 142/88  12/08/19 116/72  03/31/19 116/66    Wt Readings from Last 3 Encounters:  01/19/20 194 lb 3.2 oz (88.1 kg)  12/08/19 188 lb (85.3 kg)  03/31/19 187 lb (84.8 kg)    Physical Exam Vitals reviewed.  Constitutional:      Appearance: Normal appearance.  HENT:     Nose: Nose normal.     Mouth/Throat:     Mouth: Mucous membranes are moist.  Eyes:     General: No scleral icterus.    Conjunctiva/sclera: Conjunctivae normal.  Cardiovascular:     Rate and Rhythm: Normal rate and regular rhythm.     Heart sounds: No murmur heard.   Pulmonary:     Effort: Pulmonary effort is normal.     Breath sounds: No stridor. No wheezing, rhonchi or rales.  Abdominal:     General: Abdomen is flat.     Tenderness: There is no abdominal tenderness. There is no guarding.     Hernia: No hernia is present.  Musculoskeletal:        General: Normal range of motion.     Cervical back: Neck supple.     Right lower leg: No edema.     Left lower leg:  No edema.  Lymphadenopathy:     Cervical: No cervical adenopathy.  Skin:    General: Skin is warm and dry.  Neurological:     General: No focal deficit present.     Mental Status: He is alert.     Lab Results  Component Value Date   WBC 8.1 03/31/2019   HGB 15.0 03/31/2019   HCT 44.5 03/31/2019   PLT 208.0 03/31/2019   GLUCOSE 102 (H) 12/08/2019   CHOL 153 03/31/2019   TRIG 116.0 03/31/2019   HDL 52.10 03/31/2019   LDLDIRECT 153.3 05/31/2009   LDLCALC 78 03/31/2019   ALT 18 03/31/2019   AST 17 03/31/2019   NA 139 12/08/2019   K 3.9 12/08/2019   CL 104 12/08/2019   CREATININE 0.97 12/08/2019   BUN 25 (H) 12/08/2019   CO2 26 12/08/2019   TSH 1.26 03/31/2019    PSA 1.54 03/31/2019   INR 1.00 05/14/2013    DG Chest 2 View  Result Date: 03/26/2018 CLINICAL DATA:  Hypertension.  No chest complaints. EXAM: CHEST - 2 VIEW COMPARISON:  Chest x-ray dated March 23, 2016. FINDINGS: The heart size and mediastinal contours are within normal limits. Both lungs are clear. The visualized skeletal structures are unremarkable. IMPRESSION: No active cardiopulmonary disease. Electronically Signed   By: Titus Dubin M.D.   On: 03/26/2018 15:15    Assessment & Plan:   Russell [name] was seen today for hypertension.  Diagnoses and all orders for this visit:  Mild concentric left ventricular hypertrophy (LVH)- See below. -     losartan-hydrochlorothiazide (HYZAAR) 100-12.5 MG tablet; Take 1 tablet by mouth daily.  Essential hypertension- He is concerned his blood pressure is not adequately well controlled.  Previously, when he took 25 mg of hydrochlorothiazide with an ARB his blood pressure was too low.  I recommended that he stay on the current dose of the ARB and add 12.5 mg of hydrochlorothiazide a day. -     losartan-hydrochlorothiazide (HYZAAR) 100-12.5 MG tablet; Take 1 tablet by mouth daily.   I have discontinued Russell [name] "Russell [name]"'s losartan. I am also having him start on losartan-hydrochlorothiazide. Additionally, I am having him maintain his aspirin and simvastatin.  Meds ordered this encounter  Medications   losartan-hydrochlorothiazide (HYZAAR) 100-12.5 MG tablet    Sig: Take 1 tablet by mouth daily.    Dispense:  90 tablet    Refill:  0     Follow-up: Return in about 3 months (around 04/20/2020).  Scarlette Calico, MD

## 2020-03-11 ENCOUNTER — Encounter: Payer: Self-pay | Admitting: Internal Medicine

## 2020-03-14 IMAGING — DX DG CHEST 2V
2 series · 2 of 2 positions shown · non-contrast
Comparison: Chest x-ray dated March 23, 2016.

CLINICAL DATA: Hypertension.  No chest complaints.

EXAM:
CHEST - 2 VIEW

[chest pa]
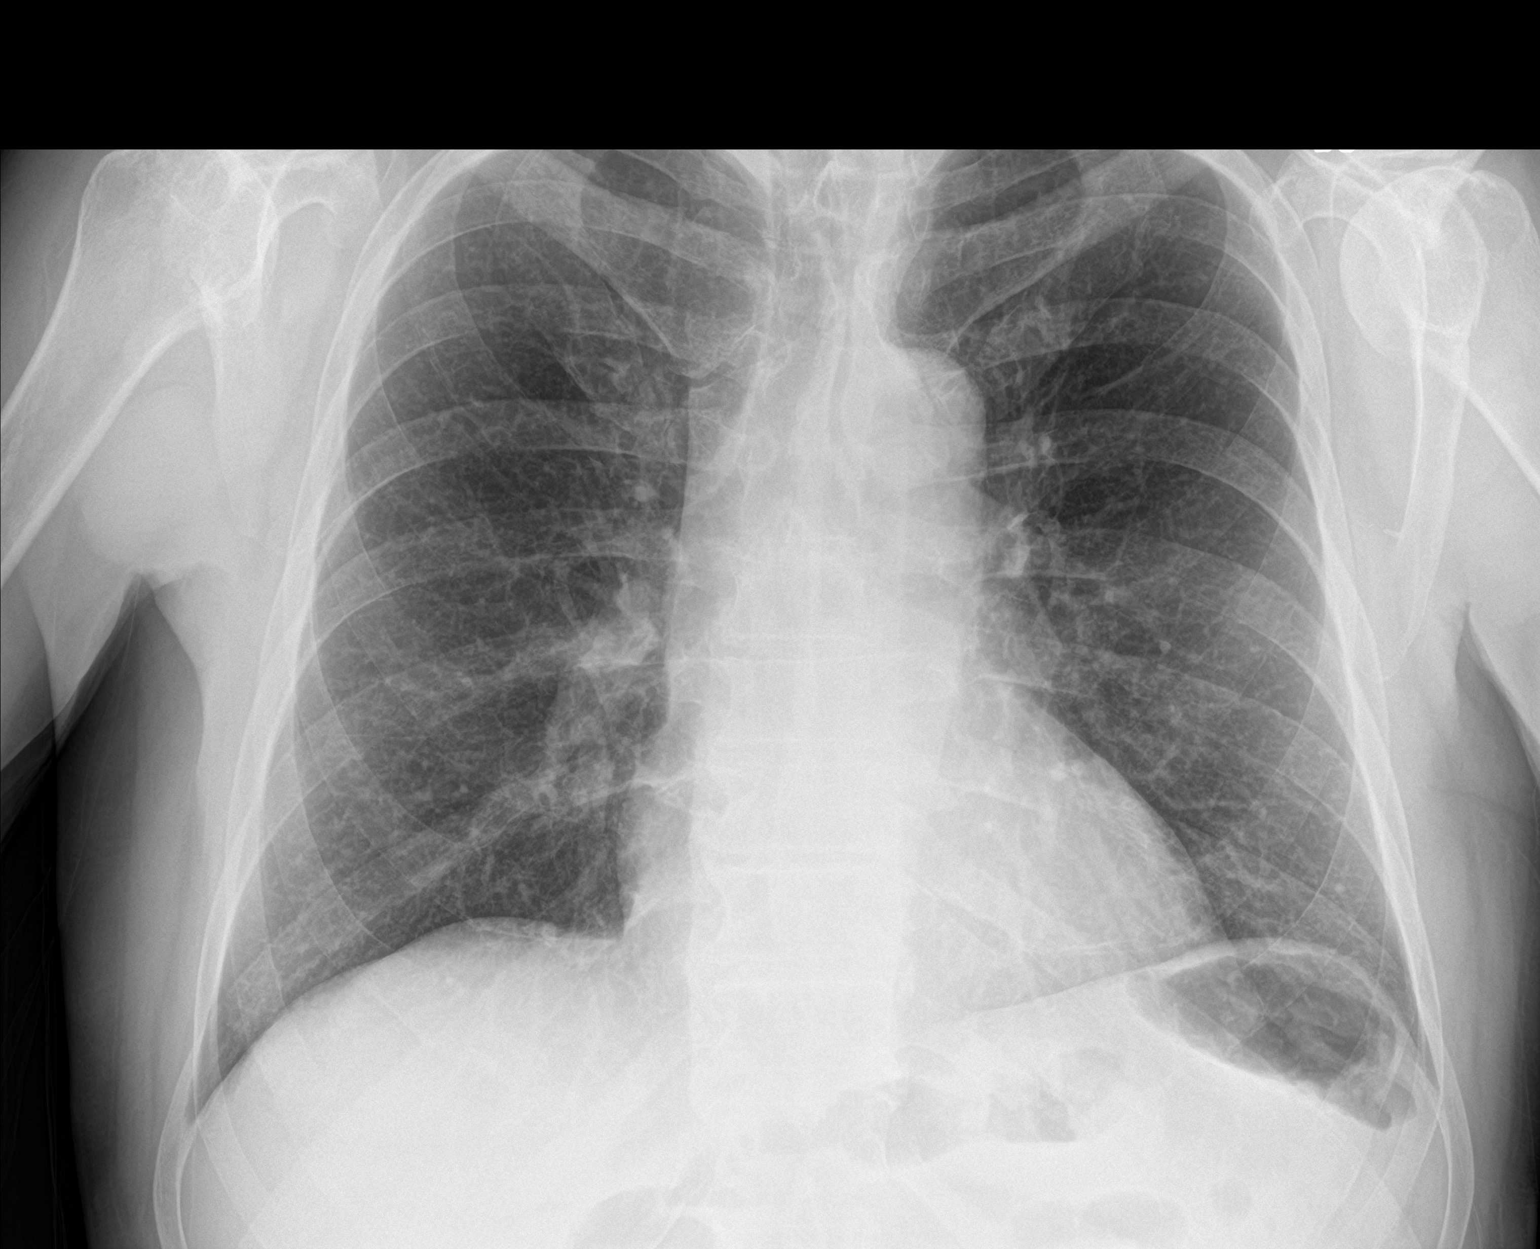

[chest lat]
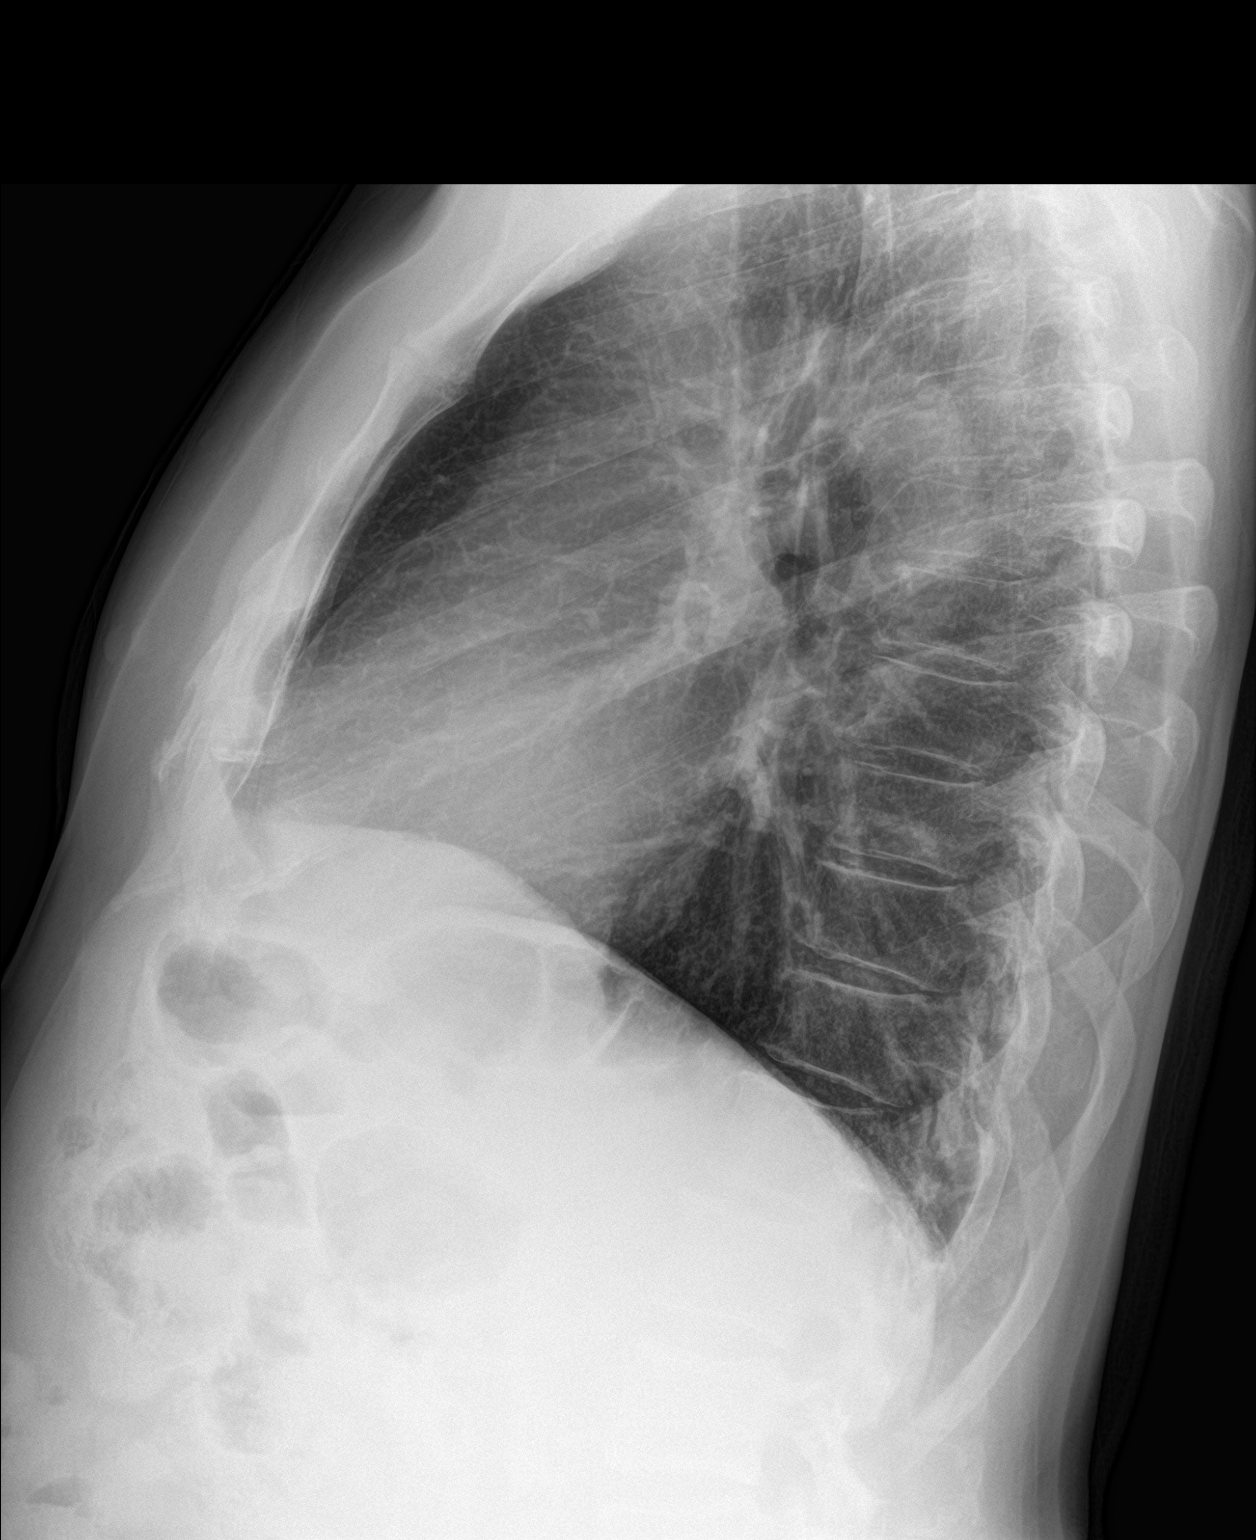

[2 of 2 positions shown; findings below may reference images not displayed]

FINDINGS: The heart size and mediastinal contours are within normal limits.
Both lungs are clear. The visualized skeletal structures are
unremarkable.
IMPRESSION: No active cardiopulmonary disease.

## 2020-03-16 DIAGNOSIS — L570 Actinic keratosis: Secondary | ICD-10-CM | POA: Diagnosis not present

## 2020-03-16 DIAGNOSIS — Z1283 Encounter for screening for malignant neoplasm of skin: Secondary | ICD-10-CM | POA: Diagnosis not present

## 2020-03-16 DIAGNOSIS — Z85828 Personal history of other malignant neoplasm of skin: Secondary | ICD-10-CM | POA: Diagnosis not present

## 2020-03-16 DIAGNOSIS — Z08 Encounter for follow-up examination after completed treatment for malignant neoplasm: Secondary | ICD-10-CM | POA: Diagnosis not present

## 2020-03-16 DIAGNOSIS — D225 Melanocytic nevi of trunk: Secondary | ICD-10-CM | POA: Diagnosis not present

## 2020-03-16 DIAGNOSIS — Z85820 Personal history of malignant melanoma of skin: Secondary | ICD-10-CM | POA: Diagnosis not present

## 2020-03-16 DIAGNOSIS — X32XXXD Exposure to sunlight, subsequent encounter: Secondary | ICD-10-CM | POA: Diagnosis not present

## 2020-03-31 DIAGNOSIS — C439 Malignant melanoma of skin, unspecified: Secondary | ICD-10-CM | POA: Diagnosis not present

## 2020-04-01 ENCOUNTER — Ambulatory Visit (INDEPENDENT_AMBULATORY_CARE_PROVIDER_SITE_OTHER): Payer: Medicare Other | Admitting: Internal Medicine

## 2020-04-01 ENCOUNTER — Other Ambulatory Visit: Payer: Self-pay

## 2020-04-01 ENCOUNTER — Encounter: Payer: Self-pay | Admitting: Internal Medicine

## 2020-04-01 VITALS — BP 134/84 | HR 60 | Temp 98.1°F | Resp 16 | Ht 72.0 in | Wt 184.0 lb

## 2020-04-01 DIAGNOSIS — N401 Enlarged prostate with lower urinary tract symptoms: Secondary | ICD-10-CM

## 2020-04-01 DIAGNOSIS — N138 Other obstructive and reflux uropathy: Secondary | ICD-10-CM

## 2020-04-01 DIAGNOSIS — I10 Essential (primary) hypertension: Secondary | ICD-10-CM

## 2020-04-01 DIAGNOSIS — Z23 Encounter for immunization: Secondary | ICD-10-CM

## 2020-04-01 DIAGNOSIS — E7800 Pure hypercholesterolemia, unspecified: Secondary | ICD-10-CM

## 2020-04-01 LAB — CBC WITH DIFFERENTIAL/PLATELET
Basophils Absolute: 0 10*3/uL (ref 0.0–0.1)
Basophils Relative: 0.6 % (ref 0.0–3.0)
Eosinophils Absolute: 0.5 10*3/uL (ref 0.0–0.7)
Eosinophils Relative: 6 % — ABNORMAL HIGH (ref 0.0–5.0)
HCT: 46.7 % (ref 39.0–52.0)
Hemoglobin: 15.6 g/dL (ref 13.0–17.0)
Lymphocytes Relative: 29.8 % (ref 12.0–46.0)
Lymphs Abs: 2.3 10*3/uL (ref 0.7–4.0)
MCHC: 33.3 g/dL (ref 30.0–36.0)
MCV: 98.1 fl (ref 78.0–100.0)
Monocytes Absolute: 0.5 10*3/uL (ref 0.1–1.0)
Monocytes Relative: 7.2 % (ref 3.0–12.0)
Neutro Abs: 4.3 10*3/uL (ref 1.4–7.7)
Neutrophils Relative %: 56.4 % (ref 43.0–77.0)
Platelets: 202 10*3/uL (ref 150.0–400.0)
RBC: 4.76 Mil/uL (ref 4.22–5.81)
RDW: 12.7 % (ref 11.5–15.5)
WBC: 7.6 10*3/uL (ref 4.0–10.5)

## 2020-04-01 LAB — BASIC METABOLIC PANEL
BUN: 18 mg/dL (ref 6–23)
CO2: 27 mEq/L (ref 19–32)
Calcium: 9.3 mg/dL (ref 8.4–10.5)
Chloride: 104 mEq/L (ref 96–112)
Creatinine, Ser: 0.86 mg/dL (ref 0.40–1.50)
GFR: 86.54 mL/min (ref >60.00)
Glucose, Bld: 97 mg/dL (ref 70–99)
Potassium: 4.4 mEq/L (ref 3.5–5.1)
Sodium: 139 mEq/L (ref 135–145)

## 2020-04-01 LAB — LIPID PANEL
Cholesterol: 152 mg/dL (ref 0–200)
HDL: 63.1 mg/dL (ref >39.00)
LDL Cholesterol: 77 mg/dL (ref 0–99)
NonHDL: 88.63
Total CHOL/HDL Ratio: 2
Triglycerides: 60 mg/dL (ref 0.0–149.0)
VLDL: 12 mg/dL (ref 0.0–40.0)

## 2020-04-01 LAB — HEPATIC FUNCTION PANEL
ALT: 17 U/L (ref 0–53)
AST: 19 U/L (ref 0–37)
Albumin: 4.4 g/dL (ref 3.5–5.2)
Alkaline Phosphatase: 55 U/L (ref 39–117)
Bilirubin, Direct: 0.2 mg/dL (ref 0.0–0.3)
Total Bilirubin: 1 mg/dL (ref 0.2–1.2)
Total Protein: 6.8 g/dL (ref 6.0–8.3)

## 2020-04-01 LAB — PSA: PSA: 1.27 ng/mL (ref 0.10–4.00)

## 2020-04-01 LAB — TSH: TSH: 0.97 u[IU]/mL (ref 0.35–4.50)

## 2020-04-01 NOTE — Progress Notes (Signed)
Subjective:  Patient ID: Russell [name], male    DOB: 1945-12-22  Age: 74 y.o. MRN: [id number]  CC: Hypertension and Hyperlipidemia  This visit occurred during the SARS-CoV-2 public health emergency.  Safety protocols were in place, including screening questions prior to the visit, additional usage of staff PPE, and extensive cleaning of exam room while observing appropriate contact time as indicated for disinfecting solutions.    HPI Russell [name] presents for f/up - He walks 18 holes of golf several times a week and does not experience chest pain, shortness of breath, dizziness, lightheadedness, edema, or fatigue.  He has intentionally lost weight with lifestyle modifications.  Outpatient Medications Prior to Visit  Medication Sig Dispense Refill  . aspirin 81 MG tablet 1 tablet by mouth three times per week.    . losartan-hydrochlorothiazide (HYZAAR) 100-12.5 MG tablet Take 1 tablet by mouth daily. 90 tablet 0  . simvastatin (ZOCOR) 40 MG tablet Take 1 tablet (40 mg total) by mouth every evening. 90 tablet 3   No facility-administered medications prior to visit.    ROS Review of Systems  Constitutional: Negative for appetite change, diaphoresis, fatigue and unexpected weight change.  HENT: Negative.   Eyes: Negative.   Respiratory: Negative for cough, chest tightness, shortness of breath and wheezing.   Cardiovascular: Negative for chest pain, palpitations and leg swelling.  Gastrointestinal: Negative for abdominal pain, constipation, diarrhea, nausea and vomiting.  Endocrine: Negative.   Genitourinary: Negative.  Negative for difficulty urinating.  Musculoskeletal: Negative for arthralgias and myalgias.  Skin: Negative.  Negative for color change and pallor.  Neurological: Negative.  Negative for dizziness, weakness, light-headedness and numbness.  Hematological: Negative for adenopathy. Does not bruise/bleed easily.  Psychiatric/Behavioral: Negative.     Objective:  BP  134/84   Pulse 60   Temp 98.1 F (36.7 C) (Oral)   Resp 16   Ht 6' (1.829 m)   Wt 184 lb (83.5 kg)   SpO2 96%   BMI 24.95 kg/m   BP Readings from Last 3 Encounters:  04/01/20 134/84  01/19/20 (!) 142/88  12/08/19 116/72    Wt Readings from Last 3 Encounters:  04/01/20 184 lb (83.5 kg)  01/19/20 194 lb 3.2 oz (88.1 kg)  12/08/19 188 lb (85.3 kg)    Physical Exam Vitals reviewed.  Constitutional:      Appearance: Normal appearance.  HENT:     Nose: Nose normal.     Mouth/Throat:     Mouth: Mucous membranes are moist.  Eyes:     General: No scleral icterus.    Conjunctiva/sclera: Conjunctivae normal.  Cardiovascular:     Rate and Rhythm: Normal rate and regular rhythm.     Heart sounds: No murmur heard.   Pulmonary:     Effort: Pulmonary effort is normal.     Breath sounds: No stridor. No wheezing, rhonchi or rales.  Abdominal:     General: Abdomen is flat. Bowel sounds are normal. There is no distension.     Palpations: Abdomen is soft. There is no hepatomegaly, splenomegaly or mass.     Tenderness: There is no abdominal tenderness.  Musculoskeletal:        General: Normal range of motion.     Cervical back: Neck supple.     Right lower leg: No edema.     Left lower leg: No edema.  Lymphadenopathy:     Cervical: No cervical adenopathy.  Skin:    General: Skin is warm.  Neurological:  General: No focal deficit present.     Mental Status: He is alert.  Psychiatric:        Mood and Affect: Mood normal.        Behavior: Behavior normal.     Lab Results  Component Value Date   WBC 7.6 04/01/2020   HGB 15.6 04/01/2020   HCT 46.7 04/01/2020   PLT 202.0 04/01/2020   GLUCOSE 97 04/01/2020   CHOL 152 04/01/2020   TRIG 60.0 04/01/2020   HDL 63.10 04/01/2020   LDLDIRECT 153.3 05/31/2009   LDLCALC 77 04/01/2020   ALT 17 04/01/2020   AST 19 04/01/2020   NA 139 04/01/2020   K 4.4 04/01/2020   CL 104 04/01/2020   CREATININE 0.86 04/01/2020   BUN 18  04/01/2020   CO2 27 04/01/2020   TSH 0.97 04/01/2020   PSA 1.27 04/01/2020   INR 1.00 05/14/2013    DG Chest 2 View  Result Date: 03/26/2018 CLINICAL DATA:  Hypertension.  No chest complaints. EXAM: CHEST - 2 VIEW COMPARISON:  Chest x-ray dated March 23, 2016. FINDINGS: The heart size and mediastinal contours are within normal limits. Both lungs are clear. The visualized skeletal structures are unremarkable. IMPRESSION: No active cardiopulmonary disease. Electronically Signed   By: Titus Dubin M.D.   On: 03/26/2018 15:15    Assessment & Plan:   Russell [name] was seen today for hypertension and hyperlipidemia.  Diagnoses and all orders for this visit:  Primary hypertension- His blood pressure is well controlled.  Electrolytes and renal function are normal.  Will continue the combination of an ARB and thiazide diuretic. -     CBC with Differential/Platelet; Future -     Cancel: BASIC METABOLIC PANEL WITH GFR; Future -     Basic metabolic panel; Future -     Basic metabolic panel -     CBC with Differential/Platelet  Need for influenza vaccination  Pure hypercholesterolemia- He has achieved his LDL goal and is doing well on the statin. -     Lipid panel; Future -     TSH; Future -     Hepatic function panel; Future -     Hepatic function panel -     TSH -     Lipid panel  BPH with obstruction/lower urinary tract symptoms- His PSA is normal which is a reassuring sign that he does not have prostate cancer.  He has no symptoms that need to be treated. -     PSA; Future -     PSA  Flu vaccine need -     Flu Vaccine QUAD High Dose(Fluad)   I am having Russell [name] "[name]" maintain his aspirin.  No orders of the defined types were placed in this encounter.    Follow-up: Return in about 6 months (around 09/30/2020).  Scarlette Calico, MD

## 2020-04-01 NOTE — Patient Instructions (Signed)

## 2020-04-02 ENCOUNTER — Encounter: Payer: Self-pay | Admitting: Internal Medicine

## 2020-04-03 ENCOUNTER — Other Ambulatory Visit: Payer: Self-pay | Admitting: Internal Medicine

## 2020-04-03 DIAGNOSIS — Z23 Encounter for immunization: Secondary | ICD-10-CM | POA: Diagnosis not present

## 2020-04-03 DIAGNOSIS — E785 Hyperlipidemia, unspecified: Secondary | ICD-10-CM

## 2020-04-03 DIAGNOSIS — I10 Essential (primary) hypertension: Secondary | ICD-10-CM

## 2020-04-03 DIAGNOSIS — I517 Cardiomegaly: Secondary | ICD-10-CM

## 2020-04-03 MED ORDER — LOSARTAN POTASSIUM-HCTZ 100-12.5 MG PO TABS: 1.0000 | ORAL_TABLET | Freq: Every day | ORAL | 1 refills | Status: DC

## 2020-04-03 MED ORDER — SIMVASTATIN 40 MG PO TABS: 40.0000 mg | ORAL_TABLET | Freq: Every evening | ORAL | 1 refills | Status: DC

## 2020-09-30 ENCOUNTER — Other Ambulatory Visit: Payer: Self-pay | Admitting: Internal Medicine

## 2020-09-30 ENCOUNTER — Encounter: Payer: Self-pay | Admitting: Internal Medicine

## 2020-09-30 DIAGNOSIS — I517 Cardiomegaly: Secondary | ICD-10-CM

## 2020-09-30 DIAGNOSIS — I10 Essential (primary) hypertension: Secondary | ICD-10-CM

## 2020-09-30 DIAGNOSIS — E785 Hyperlipidemia, unspecified: Secondary | ICD-10-CM

## 2020-09-30 MED ORDER — LOSARTAN POTASSIUM-HCTZ 100-12.5 MG PO TABS: 1.0000 | ORAL_TABLET | Freq: Every day | ORAL | 0 refills | Status: DC

## 2020-09-30 MED ORDER — SIMVASTATIN 40 MG PO TABS: 40.0000 mg | ORAL_TABLET | Freq: Every evening | ORAL | 0 refills | Status: DC

## 2020-10-07 ENCOUNTER — Other Ambulatory Visit: Payer: Self-pay

## 2020-10-07 ENCOUNTER — Ambulatory Visit (INDEPENDENT_AMBULATORY_CARE_PROVIDER_SITE_OTHER): Payer: Medicare Other | Admitting: Internal Medicine

## 2020-10-07 ENCOUNTER — Encounter: Payer: Self-pay | Admitting: Internal Medicine

## 2020-10-07 VITALS — BP 142/82 | HR 45 | Temp 97.8°F | Resp 16 | Ht 72.0 in | Wt 186.0 lb

## 2020-10-07 DIAGNOSIS — R001 Bradycardia, unspecified: Secondary | ICD-10-CM | POA: Diagnosis not present

## 2020-10-07 DIAGNOSIS — I10 Essential (primary) hypertension: Secondary | ICD-10-CM

## 2020-10-07 LAB — BASIC METABOLIC PANEL
BUN: 18 mg/dL (ref 6–23)
CO2: 29 mEq/L (ref 19–32)
Calcium: 9.4 mg/dL (ref 8.4–10.5)
Chloride: 104 mEq/L (ref 96–112)
Creatinine, Ser: 0.87 mg/dL (ref 0.40–1.50)
GFR: 86.11 mL/min (ref >60.00)
Glucose, Bld: 93 mg/dL (ref 70–99)
Potassium: 4 mEq/L (ref 3.5–5.1)
Sodium: 140 mEq/L (ref 135–145)

## 2020-10-07 NOTE — Patient Instructions (Signed)
Bradycardia, Adult Bradycardia is a slower-than-normal heartbeat. A normal resting heart rate for an adult ranges from 60 to 100 beats per minute. With bradycardia, the resting heart rate is less than 60 beats per minute. Bradycardia can prevent enough oxygen from reaching certain areas of your body when you are active. It can be serious if it keeps enough oxygen from reaching your brain and other parts of your body. Bradycardia is not a problem for everyone. For some healthy adults, a slow resting heart rate is normal. What are the causes? This condition may be caused by:  A problem with the heart, including: ? A problem with the heart's electrical system, such as a heart block. With a heart block, electrical signals between the chambers of the heart are partially or completely blocked, so they are not able to work as they should. ? A problem with the heart's natural pacemaker (sinus node). ? Heart disease. ? A heart attack. ? Heart damage. ? Lyme disease. ? A heart infection. ? A heart condition that is present at birth (congenital heart defect).  Certain medicines that treat heart conditions.  Certain conditions, such as hypothyroidism and obstructive sleep apnea.  Problems with the balance of chemicals and other substances, like potassium, in the blood.  Trauma.  Radiation therapy.   What increases the risk? You are more likely to develop this condition if you:  Are age 65 or older.  Have high blood pressure (hypertension), high cholesterol (hyperlipidemia), or diabetes.  Drink heavily, use tobacco or nicotine products, or use drugs. What are the signs or symptoms? Symptoms of this condition include:  Light-headedness.  Feeling faint or fainting.  Fatigue and weakness.  Trouble with activity or exercise.  Shortness of breath.  Chest pain (angina).  Drowsiness.  Confusion.  Dizziness. How is this diagnosed? This condition may be diagnosed based on:  Your  symptoms.  Your medical history.  A physical exam. During the exam, your health care provider will listen to your heartbeat and check your pulse. To confirm the diagnosis, your health care provider may order tests, such as:  Blood tests.  An electrocardiogram (ECG). This test records the heart's electrical activity. The test can show how fast your heart is beating and whether the heartbeat is steady.  A test in which you wear a portable device (event recorder or Holter monitor) to record your heart's electrical activity while you go about your day.  Anexercise test. How is this treated? Treatment for this condition depends on the cause of the condition and how severe your symptoms are. Treatment may involve:  Treatment of the underlying condition.  Changing your medicines or how much medicine you take.  Having a small, battery-operated device called a pacemaker implanted under the skin. When bradycardia occurs, this device can be used to increase your heart rate and help your heart beat in a regular rhythm. Follow these instructions at home: Lifestyle  Manage any health conditions that contribute to bradycardia as told by your health care provider.  Follow a heart-healthy diet. A nutrition specialist (dietitian) can help educate you about healthy food options and changes.  Follow an exercise program that is approved by your health care provider.  Maintain a healthy weight.  Try to reduce or manage your stress, such as with yoga or meditation. If you need help reducing stress, ask your health care provider.  Do not use any products that contain nicotine or tobacco, such as cigarettes, e-cigarettes, and chewing tobacco. If you need   help quitting, ask your health care provider.  Do not use illegal drugs.  Limit alcohol intake to no more than 1 drink a day for nonpregnant women and 2 drinks a day for men. Be aware of how much alcohol is in your drink. In the [place] one drink equals  one 12 oz bottle of beer (355 mL), one 5 oz glass of wine (148 mL), or one 1 oz glass of hard liquor (44 mL).   General instructions  Take over-the-counter and prescription medicines only as told by your health care provider.  Keep all follow-up visits as told by your health care provider. This is important. How is this prevented? In some cases, bradycardia may be prevented by:  Treating underlying medical problems.  Stopping behaviors or medicines that can trigger the condition. Contact a health care provider if you:  Feel light-headed or dizzy.  Almost faint.  Feel weak or are easily fatigued during physical activity.  Experience confusion or have memory problems. Get help right away if:  You faint.  You have: ? An irregular heartbeat (palpitations). ? Chest pain. ? Trouble breathing. Summary  Bradycardia is a slower-than-normal heartbeat. With bradycardia, the resting heart rate is less than 60 beats per minute.  Treatment for this condition depends on the cause.  Manage any health conditions that contribute to bradycardia as told by your health care provider.  Do not use any products that contain nicotine or tobacco, such as cigarettes, e-cigarettes, and chewing tobacco, and limit alcohol intake.  Keep all follow-up visits as told by your health care provider. This is important. This information is not intended to replace advice given to you by your health care provider. Make sure you discuss any questions you have with your health care provider. Document Revised: 12/17/2017 Document Reviewed: 11/14/2017 [employer]vier Patient Education  2021 [employer]  

## 2020-10-07 NOTE — Progress Notes (Signed)
Subjective:  Patient ID: Russell [name], male    DOB: 10-01-1945  Age: 75 y.o. MRN: [id number]  CC: Hypertension  This visit occurred during the SARS-CoV-2 public health emergency.  Safety protocols were in place, including screening questions prior to the visit, additional usage of staff PPE, and extensive cleaning of exam room while observing appropriate contact time as indicated for disinfecting solutions.    HPI Russell [name] presents for f/up -   He plays 18 holes of golf every week.  He walks the course and carries his golf clubs.  He has no exertional symptoms.  He denies dizziness, lightheadedness, palpitations, syncope, near syncope, edema, or fatigue.  Outpatient Medications Prior to Visit  Medication Sig Dispense Refill  . aspirin 81 MG tablet 1 tablet by mouth three times per week.    . losartan-hydrochlorothiazide (HYZAAR) 100-12.5 MG tablet Take 1 tablet by mouth daily. 90 tablet 0  . simvastatin (ZOCOR) 40 MG tablet Take 1 tablet (40 mg total) by mouth every evening. 90 tablet 0   No facility-administered medications prior to visit.    ROS Review of Systems  Constitutional: Negative for chills, diaphoresis, fatigue and fever.  HENT: Negative.   Eyes: Negative for visual disturbance.  Respiratory: Negative for cough, chest tightness, shortness of breath and wheezing.   Cardiovascular: Negative for chest pain, palpitations and leg swelling.  Gastrointestinal: Negative for abdominal pain, constipation, diarrhea, nausea and vomiting.  Endocrine: Negative.  Negative for cold intolerance and heat intolerance.  Genitourinary: Negative.  Negative for difficulty urinating.  Musculoskeletal: Negative for arthralgias.  Skin: Negative.  Negative for color change.  Neurological: Negative.  Negative for dizziness, weakness, light-headedness and numbness.  Hematological: Negative for adenopathy. Does not bruise/bleed easily.  Psychiatric/Behavioral: Negative.     Objective:   BP (!) 142/82 (BP Location: Right Arm, Patient Position: Sitting, Cuff Size: Large)   Pulse (!) 45   Temp 97.8 F (36.6 C) (Oral)   Resp 16   Ht 6' (1.829 m)   Wt 186 lb (84.4 kg)   SpO2 100%   BMI 25.23 kg/m   BP Readings from Last 3 Encounters:  10/07/20 (!) 142/82  04/01/20 134/84  01/19/20 (!) 142/88    Wt Readings from Last 3 Encounters:  10/07/20 186 lb (84.4 kg)  04/01/20 184 lb (83.5 kg)  01/19/20 194 lb 3.2 oz (88.1 kg)    Physical Exam Vitals reviewed.  HENT:     Nose: Nose normal.     Mouth/Throat:     Mouth: Mucous membranes are moist.  Eyes:     General: No scleral icterus.    Conjunctiva/sclera: Conjunctivae normal.  Cardiovascular:     Rate and Rhythm: Regular rhythm. Bradycardia present.     Heart sounds: No murmur heard.     Comments: EKG- Sinus bradycardia, 48 bpm Otherwise normal EKG Pulmonary:     Effort: Pulmonary effort is normal.     Breath sounds: No stridor. No wheezing or rhonchi.  Abdominal:     General: Abdomen is flat. Bowel sounds are normal. There is no distension.     Palpations: Abdomen is soft. There is no hepatomegaly, splenomegaly or mass.     Tenderness: There is no abdominal tenderness.  Musculoskeletal:     Cervical back: Neck supple.     Right lower leg: No edema.     Left lower leg: No edema.  Lymphadenopathy:     Cervical: No cervical adenopathy.  Skin:    General: Skin  is warm and dry.  Neurological:     General: No focal deficit present.     Mental Status: He is alert.  Psychiatric:        Mood and Affect: Mood normal.        Behavior: Behavior normal.     Lab Results  Component Value Date   WBC 7.6 04/01/2020   HGB 15.6 04/01/2020   HCT 46.7 04/01/2020   PLT 202.0 04/01/2020   GLUCOSE 93 10/07/2020   CHOL 152 04/01/2020   TRIG 60.0 04/01/2020   HDL 63.10 04/01/2020   LDLDIRECT 153.3 05/31/2009   LDLCALC 77 04/01/2020   ALT 17 04/01/2020   AST 19 04/01/2020   NA 140 10/07/2020   K 4.0  10/07/2020   CL 104 10/07/2020   CREATININE 0.87 10/07/2020   BUN 18 10/07/2020   CO2 29 10/07/2020   TSH 0.97 04/01/2020   PSA 1.27 04/01/2020   INR 1.00 05/14/2013    DG Chest 2 View  Result Date: 03/26/2018 CLINICAL DATA:  Hypertension.  No chest complaints. EXAM: CHEST - 2 VIEW COMPARISON:  Chest x-ray dated March 23, 2016. FINDINGS: The heart size and mediastinal contours are within normal limits. Both lungs are clear. The visualized skeletal structures are unremarkable. IMPRESSION: No active cardiopulmonary disease. Electronically Signed   By: Titus Dubin M.D.   On: 03/26/2018 15:15    Assessment & Plan:   Russell [name] was seen today for hypertension.  Diagnoses and all orders for this visit:  Primary hypertension- His blood pressure is adequately well controlled.  Electrolytes and renal function are normal normal. -     Basic metabolic panel; Future -     Basic metabolic panel  Bradycardia- He has mild asymptomatic bradycardia.  His recent TSH was normal.  No intervention is needed at this time. -     EKG 12-Lead   I am having Russell T. Lovena Le "[name]" maintain his aspirin, losartan-hydrochlorothiazide, and simvastatin.  No orders of the defined types were placed in this encounter.    Follow-up: Return in about 6 months (around 04/08/2021).  Scarlette Calico, MD

## 2020-10-12 DIAGNOSIS — X32XXXD Exposure to sunlight, subsequent encounter: Secondary | ICD-10-CM | POA: Diagnosis not present

## 2020-10-12 DIAGNOSIS — L570 Actinic keratosis: Secondary | ICD-10-CM | POA: Diagnosis not present

## 2020-10-15 DIAGNOSIS — Z23 Encounter for immunization: Secondary | ICD-10-CM | POA: Diagnosis not present

## 2020-11-17 DIAGNOSIS — X32XXXD Exposure to sunlight, subsequent encounter: Secondary | ICD-10-CM | POA: Diagnosis not present

## 2020-11-17 DIAGNOSIS — L570 Actinic keratosis: Secondary | ICD-10-CM | POA: Diagnosis not present

## 2020-11-30 NOTE — Telephone Encounter (Signed)
Pt wants to know if he is due for colonoscopy  Please advise.

## 2020-12-30 ENCOUNTER — Other Ambulatory Visit: Payer: Self-pay | Admitting: Internal Medicine

## 2020-12-30 DIAGNOSIS — E785 Hyperlipidemia, unspecified: Secondary | ICD-10-CM

## 2020-12-30 DIAGNOSIS — I517 Cardiomegaly: Secondary | ICD-10-CM

## 2020-12-30 DIAGNOSIS — K635 Polyp of colon: Secondary | ICD-10-CM | POA: Insufficient documentation

## 2020-12-30 DIAGNOSIS — I10 Essential (primary) hypertension: Secondary | ICD-10-CM

## 2021-02-04 DIAGNOSIS — L820 Inflamed seborrheic keratosis: Secondary | ICD-10-CM | POA: Diagnosis not present

## 2021-03-15 ENCOUNTER — Encounter: Payer: Self-pay | Admitting: Internal Medicine

## 2021-03-18 DIAGNOSIS — Z08 Encounter for follow-up examination after completed treatment for malignant neoplasm: Secondary | ICD-10-CM | POA: Diagnosis not present

## 2021-03-18 DIAGNOSIS — Z85820 Personal history of malignant melanoma of skin: Secondary | ICD-10-CM | POA: Diagnosis not present

## 2021-03-18 DIAGNOSIS — D0359 Melanoma in situ of other part of trunk: Secondary | ICD-10-CM | POA: Diagnosis not present

## 2021-03-18 DIAGNOSIS — D225 Melanocytic nevi of trunk: Secondary | ICD-10-CM | POA: Diagnosis not present

## 2021-03-18 DIAGNOSIS — L570 Actinic keratosis: Secondary | ICD-10-CM | POA: Diagnosis not present

## 2021-03-18 DIAGNOSIS — Z1283 Encounter for screening for malignant neoplasm of skin: Secondary | ICD-10-CM | POA: Diagnosis not present

## 2021-03-18 DIAGNOSIS — X32XXXD Exposure to sunlight, subsequent encounter: Secondary | ICD-10-CM | POA: Diagnosis not present

## 2021-03-30 DIAGNOSIS — D0359 Melanoma in situ of other part of trunk: Secondary | ICD-10-CM | POA: Diagnosis not present

## 2021-04-04 ENCOUNTER — Telehealth: Payer: Self-pay | Admitting: Internal Medicine

## 2021-04-04 DIAGNOSIS — Z6825 Body mass index (BMI) 25.0-25.9, adult: Secondary | ICD-10-CM | POA: Diagnosis not present

## 2021-04-04 DIAGNOSIS — C4362 Malignant melanoma of left upper limb, including shoulder: Secondary | ICD-10-CM | POA: Diagnosis not present

## 2021-04-04 DIAGNOSIS — I10 Essential (primary) hypertension: Secondary | ICD-10-CM

## 2021-04-04 DIAGNOSIS — I517 Cardiomegaly: Secondary | ICD-10-CM

## 2021-04-04 MED ORDER — LOSARTAN POTASSIUM-HCTZ 100-12.5 MG PO TABS: ORAL_TABLET | ORAL | 0 refills | Status: DC

## 2021-04-04 NOTE — Addendum Note (Signed)
Addended by: Hinda Kehr on: 04/04/2021 02:11 PM   Modules accepted: Orders

## 2021-04-04 NOTE — Telephone Encounter (Signed)
Called pt, LVM.  Rxs sent. Keep scheduled OV.

## 2021-04-04 NOTE — Telephone Encounter (Signed)
Pharmacy calling to see if the patient can get a 1 week refill for his losartan-hydrochlorothiazide (HYZAAR) 100-12.5 MG tablet  Pt is completely out has an appointment on 10.24.22

## 2021-04-07 ENCOUNTER — Ambulatory Visit: Payer: Medicare Other | Admitting: Internal Medicine

## 2021-04-11 ENCOUNTER — Ambulatory Visit (INDEPENDENT_AMBULATORY_CARE_PROVIDER_SITE_OTHER): Payer: Medicare Other | Admitting: Internal Medicine

## 2021-04-11 ENCOUNTER — Encounter: Payer: Self-pay | Admitting: Internal Medicine

## 2021-04-11 ENCOUNTER — Other Ambulatory Visit: Payer: Self-pay

## 2021-04-11 VITALS — BP 136/78 | HR 60 | Temp 97.8°F | Resp 16 | Ht 72.0 in | Wt 183.0 lb

## 2021-04-11 DIAGNOSIS — R001 Bradycardia, unspecified: Secondary | ICD-10-CM | POA: Diagnosis not present

## 2021-04-11 DIAGNOSIS — E785 Hyperlipidemia, unspecified: Secondary | ICD-10-CM

## 2021-04-11 DIAGNOSIS — Z23 Encounter for immunization: Secondary | ICD-10-CM

## 2021-04-11 DIAGNOSIS — N401 Enlarged prostate with lower urinary tract symptoms: Secondary | ICD-10-CM

## 2021-04-11 DIAGNOSIS — I517 Cardiomegaly: Secondary | ICD-10-CM

## 2021-04-11 DIAGNOSIS — I10 Essential (primary) hypertension: Secondary | ICD-10-CM

## 2021-04-11 DIAGNOSIS — N138 Other obstructive and reflux uropathy: Secondary | ICD-10-CM | POA: Diagnosis not present

## 2021-04-11 DIAGNOSIS — Z0000 Encounter for general adult medical examination without abnormal findings: Secondary | ICD-10-CM

## 2021-04-11 DIAGNOSIS — E7800 Pure hypercholesterolemia, unspecified: Secondary | ICD-10-CM | POA: Diagnosis not present

## 2021-04-11 LAB — CBC WITH DIFFERENTIAL/PLATELET
Basophils Absolute: 0.1 10*3/uL (ref 0.0–0.1)
Basophils Relative: 0.7 % (ref 0.0–3.0)
Eosinophils Absolute: 0.2 10*3/uL (ref 0.0–0.7)
Eosinophils Relative: 2.3 % (ref 0.0–5.0)
HCT: 45.2 % (ref 39.0–52.0)
Hemoglobin: 15.2 g/dL (ref 13.0–17.0)
Lymphocytes Relative: 26.5 % (ref 12.0–46.0)
Lymphs Abs: 1.9 10*3/uL (ref 0.7–4.0)
MCHC: 33.6 g/dL (ref 30.0–36.0)
MCV: 97.8 fl (ref 78.0–100.0)
Monocytes Absolute: 0.4 10*3/uL (ref 0.1–1.0)
Monocytes Relative: 6.2 % (ref 3.0–12.0)
Neutro Abs: 4.6 10*3/uL (ref 1.4–7.7)
Neutrophils Relative %: 64.3 % (ref 43.0–77.0)
Platelets: 194 10*3/uL (ref 150.0–400.0)
RBC: 4.62 Mil/uL (ref 4.22–5.81)
RDW: 12.7 % (ref 11.5–15.5)
WBC: 7.2 10*3/uL (ref 4.0–10.5)

## 2021-04-11 LAB — URINALYSIS, ROUTINE W REFLEX MICROSCOPIC
Bilirubin Urine: NEGATIVE
Hgb urine dipstick: NEGATIVE
Ketones, ur: NEGATIVE
Leukocytes,Ua: NEGATIVE
Nitrite: NEGATIVE
RBC / HPF: NONE SEEN (ref 0–?)
Specific Gravity, Urine: 1.025 (ref 1.000–1.030)
Total Protein, Urine: NEGATIVE
Urine Glucose: NEGATIVE
Urobilinogen, UA: 0.2 (ref 0.0–1.0)
pH: 6 (ref 5.0–8.0)

## 2021-04-11 LAB — BASIC METABOLIC PANEL
BUN: 21 mg/dL (ref 6–23)
CO2: 26 mEq/L (ref 19–32)
Calcium: 9.7 mg/dL (ref 8.4–10.5)
Chloride: 104 mEq/L (ref 96–112)
Creatinine, Ser: 0.8 mg/dL (ref 0.40–1.50)
GFR: 88 mL/min (ref >60.00)
Glucose, Bld: 104 mg/dL — ABNORMAL HIGH (ref 70–99)
Potassium: 4.5 mEq/L (ref 3.5–5.1)
Sodium: 140 mEq/L (ref 135–145)

## 2021-04-11 LAB — LIPID PANEL
Cholesterol: 152 mg/dL (ref 0–200)
HDL: 69.9 mg/dL (ref >39.00)
LDL Cholesterol: 73 mg/dL (ref 0–99)
NonHDL: 82.41
Total CHOL/HDL Ratio: 2
Triglycerides: 49 mg/dL (ref 0.0–149.0)
VLDL: 9.8 mg/dL (ref 0.0–40.0)

## 2021-04-11 LAB — HEPATIC FUNCTION PANEL
ALT: 19 U/L (ref 0–53)
AST: 22 U/L (ref 0–37)
Albumin: 4.5 g/dL (ref 3.5–5.2)
Alkaline Phosphatase: 56 U/L (ref 39–117)
Bilirubin, Direct: 0.3 mg/dL (ref 0.0–0.3)
Total Bilirubin: 1.5 mg/dL — ABNORMAL HIGH (ref 0.2–1.2)
Total Protein: 6.8 g/dL (ref 6.0–8.3)

## 2021-04-11 LAB — PSA: PSA: 1.62 ng/mL (ref 0.10–4.00)

## 2021-04-11 LAB — TSH: TSH: 0.91 u[IU]/mL (ref 0.35–5.50)

## 2021-04-11 MED ORDER — LOSARTAN POTASSIUM-HCTZ 100-12.5 MG PO TABS: ORAL_TABLET | ORAL | 1 refills | Status: DC

## 2021-04-11 MED ORDER — SIMVASTATIN 40 MG PO TABS: ORAL_TABLET | ORAL | 1 refills | Status: DC

## 2021-04-11 NOTE — Patient Instructions (Signed)

## 2021-04-11 NOTE — Progress Notes (Signed)
Subjective:  Patient ID: Russell [name], male    DOB: [birth year]  Age: 75 y.o. MRN: [id number]  CC: Hypertension  This visit occurred during the SARS-CoV-2 public health emergency.  Safety protocols were in place, including screening questions prior to the visit, additional usage of staff PPE, and extensive cleaning of exam room while observing appropriate contact time as indicated for disinfecting solutions.    HPI Russell [name] presents for f/up -  He walks 18 holes of golf and does not experience CP, DOE, dizziness, lightheadedness, or near syncope.  Outpatient Medications Prior to Visit  Medication Sig Dispense Refill   aspirin 81 MG tablet 1 tablet by mouth three times per week.     losartan-hydrochlorothiazide (HYZAAR) 100-12.5 MG tablet TAKE ONE TABLET EACH DAY 30 tablet 0   simvastatin (ZOCOR) 40 MG tablet ONE TABLET EACH EVENING 90 tablet 0   No facility-administered medications prior to visit.    ROS Review of Systems  Constitutional:  Negative for diaphoresis and fatigue.  HENT: Negative.    Eyes: Negative.   Respiratory:  Negative for cough, chest tightness, shortness of breath and wheezing.   Cardiovascular:  Negative for chest pain, palpitations and leg swelling.  Gastrointestinal:  Negative for abdominal pain, diarrhea, nausea and vomiting.  Endocrine: Negative.   Genitourinary: Negative.  Negative for difficulty urinating and hematuria.  Musculoskeletal: Negative.  Negative for arthralgias.  Skin: Negative.  Negative for rash.  Neurological:  Negative for dizziness, weakness and light-headedness.  Hematological:  Negative for adenopathy. Does not bruise/bleed easily.  Psychiatric/Behavioral: Negative.     Objective:  BP 136/78 (BP Location: Left Arm, Patient Position: Sitting, Cuff Size: Large)   Pulse 60   Temp 97.8 F (36.6 C) (Oral)   Resp 16   Ht 6' (1.829 m)   Wt 183 lb (83 kg)   SpO2 97%   BMI 24.82 kg/m   BP Readings from Last 3 Encounters:   04/11/21 136/78  10/07/20 (!) 142/82  04/01/20 134/84    Wt Readings from Last 3 Encounters:  04/11/21 183 lb (83 kg)  10/07/20 186 lb (84.4 kg)  04/01/20 184 lb (83.5 kg)    Physical Exam Vitals reviewed.  Constitutional:      Appearance: Normal appearance.  HENT:     Nose: Nose normal.     Mouth/Throat:     Mouth: Mucous membranes are moist.  Eyes:     General: No scleral icterus.    Conjunctiva/sclera: Conjunctivae normal.  Cardiovascular:     Rate and Rhythm: Normal rate and regular rhythm.     Heart sounds: No murmur heard. Pulmonary:     Effort: Pulmonary effort is normal.     Breath sounds: No stridor. No wheezing, rhonchi or rales.  Abdominal:     General: Abdomen is flat.     Tenderness: There is no abdominal tenderness. There is no guarding or rebound.     Hernia: No hernia is present. There is no hernia in the left inguinal area or right inguinal area.  Genitourinary:    Pubic Area: No rash.      Penis: Normal and circumcised.      Testes: Normal.     Epididymis:     Right: Normal.     Left: Normal.     Prostate: Enlarged. Not tender and no nodules present.     Rectum: Guaiac result negative. External hemorrhoid and internal hemorrhoid present. No mass, tenderness or anal fissure. Normal anal tone.  Musculoskeletal:        General: Normal range of motion.     Cervical back: Neck supple.     Right lower leg: No edema.     Left lower leg: No edema.  Lymphadenopathy:     Cervical: No cervical adenopathy.     Lower Body: No right inguinal adenopathy. No left inguinal adenopathy.  Skin:    General: Skin is warm and dry.  Neurological:     General: No focal deficit present.     Mental Status: He is alert.  Psychiatric:        Mood and Affect: Mood normal.        Behavior: Behavior normal.    Lab Results  Component Value Date   WBC 7.2 04/11/2021   HGB 15.2 04/11/2021   HCT 45.2 04/11/2021   PLT 194.0 04/11/2021   GLUCOSE 104 (H) 04/11/2021    CHOL 152 04/11/2021   TRIG 49.0 04/11/2021   HDL 69.90 04/11/2021   LDLDIRECT 153.3 05/31/2009   LDLCALC 73 04/11/2021   ALT 19 04/11/2021   AST 22 04/11/2021   NA 140 04/11/2021   K 4.5 04/11/2021   CL 104 04/11/2021   CREATININE 0.80 04/11/2021   BUN 21 04/11/2021   CO2 26 04/11/2021   TSH 0.91 04/11/2021   PSA 1.62 04/11/2021   INR 1.00 05/14/2013    DG Chest 2 View  Result Date: 03/26/2018 CLINICAL DATA:  Hypertension.  No chest complaints. EXAM: CHEST - 2 VIEW COMPARISON:  Chest x-ray dated March 23, 2016. FINDINGS: The heart size and mediastinal contours are within normal limits. Both lungs are clear. The visualized skeletal structures are unremarkable. IMPRESSION: No active cardiopulmonary disease. Electronically Signed   By: Titus Dubin M.D.   On: 03/26/2018 15:15    Assessment & Plan:   Kaylor was seen today for hypertension.  Diagnoses and all orders for this visit:  Primary hypertension- His BP is well controlled. Lytes/renal fxn normal. Will cont the ARB/thiazide combination. -     CBC with Differential/Platelet; Future -     Basic metabolic panel; Future -     Hepatic function panel; Future -     losartan-hydrochlorothiazide (HYZAAR) 100-12.5 MG tablet; TAKE ONE TABLET EACH DAY -     Hepatic function panel -     Basic metabolic panel -     CBC with Differential/Platelet  BPH with obstruction/lower urinary tract symptoms- His PSA is low. -     PSA; Future -     Urinalysis, Routine w reflex microscopic; Future -     Urinalysis, Routine w reflex microscopic -     PSA  Bradycardia- asx. -     TSH; Future -     TSH  Physical exam, annual  Pure hypercholesterolemia -     Lipid panel; Future -     Hepatic function panel; Future -     TSH; Future -     TSH -     Hepatic function panel -     Lipid panel  Mild concentric left ventricular hypertrophy (LVH)- He has a normal volume status. -     losartan-hydrochlorothiazide (HYZAAR) 100-12.5 MG tablet;  TAKE ONE TABLET EACH DAY  Essential hypertension  Hyperlipidemia LDL goal <130- LDL goal achieved. Doing well on the statin  -     simvastatin (ZOCOR) 40 MG tablet; ONE TABLET EACH EVENING  Flu vaccine need -     Flu Vaccine QUAD High Dose(Fluad)  I have  discontinued Juanantonio T. Lovena Le "[name]"'s aspirin. I am also having him maintain his simvastatin and losartan-hydrochlorothiazide.  Meds ordered this encounter  Medications   simvastatin (ZOCOR) 40 MG tablet    Sig: ONE TABLET EACH EVENING    Dispense:  90 tablet    Refill:  1   losartan-hydrochlorothiazide (HYZAAR) 100-12.5 MG tablet    Sig: TAKE ONE TABLET EACH DAY    Dispense:  90 tablet    Refill:  1      Follow-up: Return in about 6 months (around 10/10/2021).  Scarlette Calico, MD

## 2021-04-18 ENCOUNTER — Encounter: Payer: Self-pay | Admitting: Internal Medicine

## 2021-04-28 DIAGNOSIS — Z23 Encounter for immunization: Secondary | ICD-10-CM | POA: Diagnosis not present

## 2021-05-09 DIAGNOSIS — L989 Disorder of the skin and subcutaneous tissue, unspecified: Secondary | ICD-10-CM | POA: Diagnosis not present

## 2021-05-09 DIAGNOSIS — D0359 Melanoma in situ of other part of trunk: Secondary | ICD-10-CM | POA: Diagnosis not present

## 2021-05-26 DIAGNOSIS — Z4802 Encounter for removal of sutures: Secondary | ICD-10-CM | POA: Diagnosis not present

## 2021-06-14 ENCOUNTER — Ambulatory Visit (AMBULATORY_SURGERY_CENTER): Payer: Medicare Other | Admitting: *Deleted

## 2021-06-14 ENCOUNTER — Other Ambulatory Visit: Payer: Self-pay

## 2021-06-14 VITALS — Ht 72.0 in | Wt 195.0 lb

## 2021-06-14 DIAGNOSIS — Z86010 Personal history of colonic polyps: Secondary | ICD-10-CM

## 2021-06-14 MED ORDER — NA SULFATE-K SULFATE-MG SULF 17.5-3.13-1.6 GM/177ML PO SOLN: 1.0000 | Freq: Once | ORAL | 0 refills | Status: AC

## 2021-06-14 NOTE — Progress Notes (Signed)
Pt's previsit is done over the phone and all paperwork (prep instructions, blank consent form to just read over) sent to patient.  Pt's name and DOB verified at the beginning of the previsit.  Pt denies any difficulty with ambulating.    No trouble with anesthesia except constipation,  denies being told they were difficult to intubate, or hx/fam hx of malignant hyperthermia per pt   No egg or soy allergy  No home oxygen use   No medications for weight loss taken  Pt denies constipation issues  Pt informed that we do not do prior authorizations for prep  Pt- covid positive- 05-16-21

## 2021-06-28 ENCOUNTER — Ambulatory Visit (AMBULATORY_SURGERY_CENTER): Payer: Medicare Other | Admitting: Internal Medicine

## 2021-06-28 ENCOUNTER — Encounter: Payer: Self-pay | Admitting: Internal Medicine

## 2021-06-28 ENCOUNTER — Other Ambulatory Visit: Payer: Self-pay

## 2021-06-28 VITALS — BP 117/71 | HR 44 | Temp 96.9°F | Resp 16 | Ht 72.0 in | Wt 195.0 lb

## 2021-06-28 DIAGNOSIS — Z86010 Personal history of colonic polyps: Secondary | ICD-10-CM | POA: Diagnosis not present

## 2021-06-28 DIAGNOSIS — D123 Benign neoplasm of transverse colon: Secondary | ICD-10-CM | POA: Diagnosis not present

## 2021-06-28 DIAGNOSIS — D120 Benign neoplasm of cecum: Secondary | ICD-10-CM

## 2021-06-28 DIAGNOSIS — E785 Hyperlipidemia, unspecified: Secondary | ICD-10-CM | POA: Diagnosis not present

## 2021-06-28 DIAGNOSIS — I10 Essential (primary) hypertension: Secondary | ICD-10-CM | POA: Diagnosis not present

## 2021-06-28 MED ORDER — SODIUM CHLORIDE 0.9 % IV SOLN: 500.0000 mL | Freq: Once | INTRAVENOUS | Status: DC

## 2021-06-28 NOTE — Patient Instructions (Addendum)
YOU HAD AN ENDOSCOPIC PROCEDURE TODAY AT [facility]:   Refer to the procedure report that was given to you for any specific questions about what was found during the examination.  If the procedure report does not answer your questions, please call your gastroenterologist to clarify.  If you requested that your care partner not be given the details of your procedure findings, then the procedure report has been included in a sealed envelope for you to review at your convenience later.  YOU SHOULD EXPECT: Some feelings of bloating in the abdomen. Passage of more gas than usual.  Walking can help get rid of the air that was put into your GI tract during the procedure and reduce the bloating. If you had a lower endoscopy (such as a colonoscopy or flexible sigmoidoscopy) you may notice spotting of blood in your stool or on the toilet paper. If you underwent a bowel prep for your procedure, you may not have a normal bowel movement for a few days.  Please Note:  You might notice some irritation and congestion in your nose or some drainage.  This is from the oxygen used during your procedure.  There is no need for concern and it should clear up in a day or so.  SYMPTOMS TO REPORT IMMEDIATELY:  Following lower endoscopy (colonoscopy or flexible sigmoidoscopy):  Excessive amounts of blood in the stool  Significant tenderness or worsening of abdominal pains  Swelling of the abdomen that is new, acute  Fever of 100F or higher    For urgent or emergent issues, a gastroenterologist can be reached at any hour by calling [phone]. Do not use MyChart messaging for urgent concerns.    DIET:  We do recommend a small meal at first, but then you may proceed to your regular diet.  Drink plenty of fluids but you should avoid alcoholic beverages for 24 hours. Try to increase the fiber in your diet, and drink plenty of water.  ACTIVITY:  You should plan to take it easy for the rest of today and  you should NOT DRIVE or use heavy machinery until tomorrow (because of the sedation medicines used during the test).    FOLLOW UP: Our staff will call the number listed on your records 48-72 hours following your procedure to check on you and address any questions or concerns that you may have regarding the information given to you following your procedure. If we do not reach you, we will leave a message.  We will attempt to reach you two times.  During this call, we will ask if you have developed any symptoms of COVID 19. If you develop any symptoms (ie: fever, flu-like symptoms, shortness of breath, cough etc.) before then, please call [phone].  If you test positive for Covid 19 in the 2 weeks post procedure, please call and report this information to [place].    If any biopsies were taken you will be contacted by phone or by letter within the next 1-3 weeks.  Please call us at [phone] if you have not heard about the biopsies in 3 weeks.    SIGNATURES/CONFIDENTIALITY: You and/or your care partner have signed paperwork which will be entered into your electronic medical record.  These signatures attest to the fact that that the information above on your After Visit Summary has been reviewed and is understood.  Full responsibility of the confidentiality of this discharge information lies with you and/or your care-partner.

## 2021-06-28 NOTE — Op Note (Signed)
[facility] Patient Name: Russell [name] Procedure Date: 06/28/2021 7:31 AM MRN: [id number] Endoscopist: Docia Chuck. [name] , MD Age: 76 Referring MD:  Date of Birth: 08-31-1945 Gender: Male Account #: [id number] Procedure:                Colonoscopy with cold snare polypectomy x 1; with                            biopsies Indications:              High risk colon cancer surveillance: Personal                            history of adenoma with villous component, High                            risk colon cancer surveillance: Personal history of                            multiple (3 or more) adenomas. Previous                            examinations 2002, 2008, 2014, 2017 Medicines:                Monitored Anesthesia Care Procedure:                Pre-Anesthesia Assessment:                           - Prior to the procedure, a History and Physical                            was performed, and patient medications and                            allergies were reviewed. The patient's tolerance of                            previous anesthesia was also reviewed. The risks                            and benefits of the procedure and the sedation                            options and risks were discussed with the patient.                            All questions were answered, and informed consent                            was obtained. Prior Anticoagulants: The patient has                            taken no previous anticoagulant or antiplatelet  agents. After reviewing the risks and benefits, the                            patient was deemed in satisfactory condition to                            undergo the procedure.                           After obtaining informed consent, the colonoscope                            was passed under direct vision. Throughout the                            procedure, the patient's blood pressure, pulse, and                             oxygen saturations were monitored continuously. The                            CF [id number] #[id number] was introduced through the anus                            and advanced to the the cecum, identified by                            appendiceal orifice and ileocecal valve. The                            ileocecal valve, appendiceal orifice, and rectum                            were photographed. The quality of the bowel                            preparation was excellent. The colonoscopy was                            performed without difficulty. The patient tolerated                            the procedure well. The bowel preparation used was                            SUPREP via split dose instruction. Scope In: 8:11:10 AM Scope Out: 8:26:52 AM Scope Withdrawal Time: 0 hours 11 minutes 13 seconds  Total Procedure Duration: 0 hours 15 minutes 42 seconds  Findings:                 A 5 mm polyp was found in the cecum. The polyp was                            removed with a cold snare. Resection and  retrieval                            were complete.                           A 1 mm polyp was found in the transverse colon. The                            polyp was removed with a jumbo cold forceps.                            Resection and retrieval were complete.                           Multiple diverticula were found in the sigmoid                            colon.                           Internal hemorrhoids were found during retroflexion.                           The exam was otherwise without abnormality on                            direct and retroflexion views. Complications:            No immediate complications. Estimated blood loss:                            None. Estimated Blood Loss:     Estimated blood loss: none. Impression:               - One 5 mm polyp in the cecum, removed with a cold                            snare. Resected and retrieved.                            - One 1 mm polyp in the transverse colon, removed                            with a jumbo cold forceps. Resected and retrieved.                           - Diverticulosis in the sigmoid colon.                           - Internal hemorrhoids.                           - The examination was otherwise normal on direct  and retroflexion views. Recommendation:           - Repeat colonoscopy in 5 years for surveillance.                           - Patient has a contact number available for                            emergencies. The signs and symptoms of potential                            delayed complications were discussed with the                            patient. Return to normal activities tomorrow.                            Written discharge instructions were provided to the                            patient.                           - Resume previous diet.                           - Continue present medications.                           - Await pathology results. Docia Chuck. [name], MD 06/28/2021 8:37:26 AM This report has been signed electronically.

## 2021-06-28 NOTE — Progress Notes (Signed)
Report to PACU, RN, vss, BBS= Clear.  

## 2021-06-28 NOTE — Progress Notes (Signed)
Called to room to assist during endoscopic procedure.  Patient ID and intended procedure confirmed with present staff. Received instructions for my participation in the procedure from the performing physician.  

## 2021-06-28 NOTE — Progress Notes (Signed)
Pt's states no medical or surgical changes since previsit or office visit. 

## 2021-06-28 NOTE — Progress Notes (Signed)
HISTORY OF PRESENT ILLNESS:  Russell [name] is a 76 y.o. male with a history of multiple and advanced adenomatous colon polyps.  Presents today for follow-up.  No active complaints  REVIEW OF SYSTEMS:  All non-GI ROS negative.  Past Medical History:  Diagnosis Date   Acute prostatitis    Arthritis    hands   Cancer ([facility])    basal cell removed, melanoma October 2022   Dislocated patella 04/2013   S/P   ARTHROPLASTY   Hemorrhoids    History of skin cancer    Hx of colonic polyps    Hypertension    Kidney stone    passed stone - no surgery required   Other and unspecified hyperlipidemia    Smoker    Whole blood donor     Past Surgical History:  Procedure Laterality Date   COLONOSCOPY     EYE SURGERY     for scratced cornea   KNEE ARTHROPLASTY Left 04/30/2013   Procedure: COMPUTER ASSISTED TOTAL KNEE ARTHROPLASTY;  Surgeon: Marybelle Killings, MD;  Location: [facility];  Service: Orthopedics;  Laterality: Left;  Left Cemented Total Knee Arthroplasty   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 Isaiah Blakes at [facility]   left knee arthroscopy  03/2007   MOHS SURGERY     November 21,2022- lower back   PATELLAR TENDON REPAIR Left 05/14/2013   Procedure: Left Total Knee Arthroplasty Medial Retinaculum Repair;  Surgeon: Marybelle Killings, MD;  Location: [facility];  Service: Orthopedics;  Laterality: Left;  Left Total Knee Arthroplasty Medial Retinaculum Repair   POLYPECTOMY     skin cancer removed Left 2016   shoulder    skin cancer removed N/A 02/22/2016   from top of his head.  this was done at [facility]  reports that he has been smoking cigars. He has never used smokeless tobacco. He reports current alcohol use of about 14.0 standard drinks per week. He reports that he does not use drugs.  family history includes Colon cancer in his maternal grandfather.  No Known Allergies     PHYSICAL EXAMINATION:  Vital signs: BP  136/86    Pulse (!) 51    Temp (!) 96.9 F (36.1 C)    Resp 12    Ht 6' (1.829 m)    Wt 195 lb (88.5 kg)    SpO2 98%    BMI 26.45 kg/m  General: Well-developed, well-nourished, no acute distress HEENT: Sclerae are anicteric, conjunctiva pink. Oral mucosa intact Lungs: Clear Heart: Regular Abdomen: soft, nontender, nondistended, no obvious ascites, no peritoneal signs, normal bowel sounds. No organomegaly. Extremities: No edema Psychiatric: alert and oriented x3. Cooperative     ASSESSMENT:  History of adenomatous colon polyps.  Due for surveillance   PLAN:   Surveillance colonoscopy

## 2021-06-30 ENCOUNTER — Telehealth: Payer: Self-pay

## 2021-06-30 NOTE — Telephone Encounter (Signed)
°  Follow up Call-  Call back number 06/28/2021  Post procedure Call Back phone  # [phone]  Permission to leave phone message Yes  Some recent data might be hidden     Patient questions:  Do you have a fever, pain , or abdominal swelling? No. Pain Score  0 *  Have you tolerated food without any problems? Yes.    Have you been able to return to your normal activities? Yes.    Do you have any questions about your discharge instructions: Diet   No. Medications  No. Follow up visit  No.  Do you have questions or concerns about your Care? No.  Actions: * If pain score is 4 or above: No action needed, pain <4.

## 2021-07-04 ENCOUNTER — Encounter: Payer: Self-pay | Admitting: Internal Medicine

## 2021-07-05 DIAGNOSIS — Z85820 Personal history of malignant melanoma of skin: Secondary | ICD-10-CM | POA: Diagnosis not present

## 2021-07-05 DIAGNOSIS — L820 Inflamed seborrheic keratosis: Secondary | ICD-10-CM | POA: Diagnosis not present

## 2021-07-05 DIAGNOSIS — Z1283 Encounter for screening for malignant neoplasm of skin: Secondary | ICD-10-CM | POA: Diagnosis not present

## 2021-07-05 DIAGNOSIS — D225 Melanocytic nevi of trunk: Secondary | ICD-10-CM | POA: Diagnosis not present

## 2021-07-05 DIAGNOSIS — X32XXXD Exposure to sunlight, subsequent encounter: Secondary | ICD-10-CM | POA: Diagnosis not present

## 2021-07-05 DIAGNOSIS — Z08 Encounter for follow-up examination after completed treatment for malignant neoplasm: Secondary | ICD-10-CM | POA: Diagnosis not present

## 2021-07-05 DIAGNOSIS — L570 Actinic keratosis: Secondary | ICD-10-CM | POA: Diagnosis not present

## 2021-10-06 ENCOUNTER — Other Ambulatory Visit: Payer: Self-pay | Admitting: Internal Medicine

## 2021-10-06 DIAGNOSIS — E785 Hyperlipidemia, unspecified: Secondary | ICD-10-CM

## 2021-10-07 DIAGNOSIS — L820 Inflamed seborrheic keratosis: Secondary | ICD-10-CM | POA: Diagnosis not present

## 2021-10-07 DIAGNOSIS — Z1283 Encounter for screening for malignant neoplasm of skin: Secondary | ICD-10-CM | POA: Diagnosis not present

## 2021-10-07 DIAGNOSIS — D0439 Carcinoma in situ of skin of other parts of face: Secondary | ICD-10-CM | POA: Diagnosis not present

## 2021-10-07 DIAGNOSIS — D225 Melanocytic nevi of trunk: Secondary | ICD-10-CM | POA: Diagnosis not present

## 2021-10-07 DIAGNOSIS — L570 Actinic keratosis: Secondary | ICD-10-CM | POA: Diagnosis not present

## 2021-10-07 DIAGNOSIS — X32XXXD Exposure to sunlight, subsequent encounter: Secondary | ICD-10-CM | POA: Diagnosis not present

## 2021-10-07 DIAGNOSIS — Z85820 Personal history of malignant melanoma of skin: Secondary | ICD-10-CM | POA: Diagnosis not present

## 2021-10-07 DIAGNOSIS — Z08 Encounter for follow-up examination after completed treatment for malignant neoplasm: Secondary | ICD-10-CM | POA: Diagnosis not present

## 2021-10-27 ENCOUNTER — Other Ambulatory Visit: Payer: Self-pay | Admitting: Internal Medicine

## 2021-10-27 DIAGNOSIS — I10 Essential (primary) hypertension: Secondary | ICD-10-CM

## 2021-10-27 DIAGNOSIS — I517 Cardiomegaly: Secondary | ICD-10-CM

## 2021-10-28 ENCOUNTER — Telehealth: Payer: Self-pay

## 2021-10-28 DIAGNOSIS — I10 Essential (primary) hypertension: Secondary | ICD-10-CM

## 2021-10-28 DIAGNOSIS — I517 Cardiomegaly: Secondary | ICD-10-CM

## 2021-10-28 NOTE — Telephone Encounter (Signed)
Pt is requesting a refill on: ?losartan-hydrochlorothiazide (HYZAAR) 100-12.5 MG tablet ? ?Pharmacy: ?[place], [place] - [address] ? ?LOV 04/11/21 ?ROV 11/07/21 ?

## 2021-10-31 MED ORDER — LOSARTAN POTASSIUM-HCTZ 100-12.5 MG PO TABS: ORAL_TABLET | ORAL | 0 refills | Status: DC

## 2021-11-07 ENCOUNTER — Encounter: Payer: Self-pay | Admitting: Internal Medicine

## 2021-11-07 ENCOUNTER — Ambulatory Visit (INDEPENDENT_AMBULATORY_CARE_PROVIDER_SITE_OTHER): Payer: Medicare Other | Admitting: Internal Medicine

## 2021-11-07 VITALS — BP 118/68 | HR 63 | Temp 98.1°F | Resp 16 | Ht 72.0 in | Wt 192.0 lb

## 2021-11-07 DIAGNOSIS — I10 Essential (primary) hypertension: Secondary | ICD-10-CM | POA: Diagnosis not present

## 2021-11-07 DIAGNOSIS — I517 Cardiomegaly: Secondary | ICD-10-CM

## 2021-11-07 DIAGNOSIS — Z23 Encounter for immunization: Secondary | ICD-10-CM

## 2021-11-07 LAB — BASIC METABOLIC PANEL
BUN: 19 mg/dL (ref 6–23)
CO2: 30 mEq/L (ref 19–32)
Calcium: 9.4 mg/dL (ref 8.4–10.5)
Chloride: 103 mEq/L (ref 96–112)
Creatinine, Ser: 1.08 mg/dL (ref 0.40–1.50)
GFR: 67.96 mL/min (ref >60.00)
Glucose, Bld: 133 mg/dL — ABNORMAL HIGH (ref 70–99)
Potassium: 3.6 mEq/L (ref 3.5–5.1)
Sodium: 140 mEq/L (ref 135–145)

## 2021-11-07 MED ORDER — LOSARTAN POTASSIUM 100 MG PO TABS: 100.0000 mg | ORAL_TABLET | Freq: Every day | ORAL | 1 refills | Status: DC

## 2021-11-07 MED ORDER — SHINGRIX 50 MCG/0.5ML IM SUSR: 0.5000 mL | Freq: Once | INTRAMUSCULAR | 1 refills | Status: AC

## 2021-11-07 NOTE — Progress Notes (Unsigned)
Subjective:  Patient ID: Russell [name], male    DOB: June 03, 1946  Age: 76 y.o. 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  He does not experience fatigue, chest pain, shortness of breath, edema, dizziness, lightheadedness, syncope, or near syncope.  Outpatient Medications Prior to Visit  Medication Sig Dispense Refill   Ascorbic Acid (VITAMIN C PO) Take 500 mg by mouth daily.     aspirin EC 81 MG tablet Take 81 mg by mouth daily. Takes 3 times weekly     simvastatin (ZOCOR) 40 MG tablet TAKE ONE TABLET EVERY EVENING 90 tablet 1   losartan-hydrochlorothiazide (HYZAAR) 100-12.5 MG tablet TAKE ONE TABLET EACH DAY. APPOINTMENT NEEDED FOR ADDITIONAL REFILLS 30 tablet 0   No facility-administered medications prior to visit.    ROS Review of Systems  Constitutional:  Negative for diaphoresis, fatigue and unexpected weight change.  HENT: Negative.    Eyes: Negative.   Respiratory:  Negative for cough, chest tightness, shortness of breath and wheezing.   Cardiovascular:  Negative for chest pain, palpitations and leg swelling.  Gastrointestinal:  Negative for abdominal pain, constipation, diarrhea, nausea and vomiting.  Endocrine: Negative.   Genitourinary: Negative.  Negative for difficulty urinating and dysuria.  Musculoskeletal: Negative.   Skin: Negative.   Neurological:  Negative for dizziness, weakness, light-headedness and numbness.  Hematological:  Negative for adenopathy. Does not bruise/bleed easily.  Psychiatric/Behavioral: Negative.     Objective:  BP 120/76 (BP Location: Left Arm, Patient Position: Sitting, Cuff Size: Large)   Pulse 63   Temp 98.1 F (36.7 C) (Oral)   Ht 6' (1.829 m)   Wt 192 lb (87.1 kg)   SpO2 95%   BMI 26.04 kg/m   BP Readings from Last 3 Encounters:  11/07/21 120/76  06/28/21 117/71  04/11/21 136/78    Wt Readings from Last 3 Encounters:  11/07/21 192 lb  (87.1 kg)  06/28/21 195 lb (88.5 kg)  06/14/21 195 lb (88.5 kg)    Physical Exam Vitals reviewed.  HENT:     Nose: Nose normal.     Mouth/Throat:     Mouth: Mucous membranes are moist.  Eyes:     General: No scleral icterus.    Conjunctiva/sclera: Conjunctivae normal.  Cardiovascular:     Rate and Rhythm: Normal rate and regular rhythm.     Pulses: Normal pulses.     Heart sounds: No murmur heard. Pulmonary:     Effort: Pulmonary effort is normal.     Breath sounds: No stridor. No wheezing, rhonchi or rales.  Abdominal:     General: Abdomen is flat.     Palpations: There is no mass.     Tenderness: There is no abdominal tenderness. There is no guarding.     Hernia: No hernia is present.  Musculoskeletal:        General: Normal range of motion.     Cervical back: Neck supple.  Lymphadenopathy:     Cervical: No cervical adenopathy.  Skin:    General: Skin is warm.  Neurological:     General: No focal deficit present.     Mental Status: He is alert.  Psychiatric:        Mood and Affect: Mood normal.        Behavior: Behavior normal.    Lab Results  Component Value Date   WBC 7.2 04/11/2021   HGB 15.2  04/11/2021   HCT 45.2 04/11/2021   PLT 194.0 04/11/2021   GLUCOSE 133 (H) 11/07/2021   CHOL 152 04/11/2021   TRIG 49.0 04/11/2021   HDL 69.90 04/11/2021   LDLDIRECT 153.3 05/31/2009   LDLCALC 73 04/11/2021   ALT 19 04/11/2021   AST 22 04/11/2021   NA 140 11/07/2021   K 3.6 11/07/2021   CL 103 11/07/2021   CREATININE 1.08 11/07/2021   BUN 19 11/07/2021   CO2 30 11/07/2021   TSH 0.91 04/11/2021   PSA 1.62 04/11/2021   INR 1.00 05/14/2013    DG Chest 2 View  Result Date: 03/26/2018 CLINICAL DATA:  Hypertension.  No chest complaints. EXAM: CHEST - 2 VIEW COMPARISON:  Chest x-ray dated March 23, 2016. FINDINGS: The heart size and mediastinal contours are within normal limits. Both lungs are clear. The visualized skeletal structures are unremarkable.  IMPRESSION: No active cardiopulmonary disease. Electronically Signed   By: Titus Dubin M.D.   On: 03/26/2018 15:15    Assessment & Plan:   Russell [name] was seen today for hypertension.  Diagnoses and all orders for this visit:  Need for prophylactic vaccination and inoculation against varicella -     Zoster Vaccine Adjuvanted [facility]) injection; Inject 0.5 mLs into the muscle once for 1 dose.  Primary hypertension- His blood pressure is overcontrolled.  Will discontinue the thiazide diuretic but continue the ARB. -     losartan (COZAAR) 100 MG tablet; Take 1 tablet (100 mg total) by mouth daily. -     Basic metabolic panel; Future -     Basic metabolic panel  Mild concentric left ventricular hypertrophy (LVH)- He has a normal volume status. -     losartan (COZAAR) 100 MG tablet; Take 1 tablet (100 mg total) by mouth daily.   I have discontinued Russell [name] "[name]"'s losartan-hydrochlorothiazide. I am also having him start on losartan and Shingrix. Additionally, I am having him maintain his Ascorbic Acid (VITAMIN C PO), aspirin EC, and simvastatin.  Meds ordered this encounter  Medications   losartan (COZAAR) 100 MG tablet    Sig: Take 1 tablet (100 mg total) by mouth daily.    Dispense:  90 tablet    Refill:  1   Zoster Vaccine Adjuvanted [facility]) injection    Sig: Inject 0.5 mLs into the muscle once for 1 dose.    Dispense:  0.5 mL    Refill:  1     Follow-up: Return in about 6 months (around 05/10/2022).  Scarlette Calico, MD

## 2021-11-07 NOTE — Patient Instructions (Signed)
Hypertension, Adult High blood pressure (hypertension) is when the force of blood pumping through the arteries is too strong. The arteries are the blood vessels that carry blood from the heart throughout the body. Hypertension forces the heart to work harder to pump blood and may cause arteries to become narrow or stiff. Untreated or uncontrolled hypertension can lead to a heart attack, heart failure, a stroke, kidney disease, and other problems. A blood pressure reading consists of a higher number over a lower number. Ideally, your blood pressure should be below 120/80. The first ("top") number is called the systolic pressure. It is a measure of the pressure in your arteries as your heart beats. The second ("bottom") number is called the diastolic pressure. It is a measure of the pressure in your arteries as the heart relaxes. What are the causes? The exact cause of this condition is not known. There are some conditions that result in high blood pressure. What increases the risk? Certain factors may make you more likely to develop high blood pressure. Some of these risk factors are under your control, including: Smoking. Not getting enough exercise or physical activity. Being overweight. Having too much fat, sugar, calories, or salt (sodium) in your diet. Drinking too much alcohol. Other risk factors include: Having a personal history of heart disease, diabetes, high cholesterol, or kidney disease. Stress. Having a family history of high blood pressure and high cholesterol. Having obstructive sleep apnea. Age. The risk increases with age. What are the signs or symptoms? High blood pressure may not cause symptoms. Very high blood pressure (hypertensive crisis) may cause: Headache. Fast or irregular heartbeats (palpitations). Shortness of breath. Nosebleed. Nausea and vomiting. Vision changes. Severe chest pain, dizziness, and seizures. How is this diagnosed? This condition is diagnosed by  measuring your blood pressure while you are seated, with your arm resting on a flat surface, your legs uncrossed, and your feet flat on the floor. The cuff of the blood pressure monitor will be placed directly against the skin of your upper arm at the level of your heart. Blood pressure should be measured at least twice using the same arm. Certain conditions can cause a difference in blood pressure between your right and left arms. If you have a high blood pressure reading during one visit or you have normal blood pressure with other risk factors, you may be asked to: Return on a different day to have your blood pressure checked again. Monitor your blood pressure at home for 1 week or longer. If you are diagnosed with hypertension, you may have other blood or imaging tests to help your health care provider understand your overall risk for other conditions. How is this treated? This condition is treated by making healthy lifestyle changes, such as eating healthy foods, exercising more, and reducing your alcohol intake. You may be referred for counseling on a healthy diet and physical activity. Your health care provider may prescribe medicine if lifestyle changes are not enough to get your blood pressure under control and if: Your systolic blood pressure is above 130. Your diastolic blood pressure is above 80. Your personal target blood pressure may vary depending on your medical conditions, your age, and other factors. Follow these instructions at home: Eating and drinking  Eat a diet that is high in fiber and potassium, and low in sodium, added sugar, and fat. An example of this eating plan is called the DASH diet. DASH stands for Dietary Approaches to Stop Hypertension. To eat this way: Eat   plenty of fresh fruits and vegetables. Try to fill one half of your plate at each meal with fruits and vegetables. Eat whole grains, such as whole-wheat pasta, brown rice, or whole-grain bread. Fill about one  fourth of your plate with whole grains. Eat or drink low-fat dairy products, such as skim milk or low-fat yogurt. Avoid fatty cuts of meat, processed or cured meats, and poultry with skin. Fill about one fourth of your plate with lean proteins, such as fish, chicken without skin, beans, eggs, or tofu. Avoid pre-made and processed foods. These tend to be higher in sodium, added sugar, and fat. Reduce your daily sodium intake. Many people with hypertension should eat less than 1,500 mg of sodium a day. Do not drink alcohol if: Your health care provider tells you not to drink. You are pregnant, may be pregnant, or are planning to become pregnant. If you drink alcohol: Limit how much you have to: 0-1 drink a day for women. 0-2 drinks a day for men. Know how much alcohol is in your drink. In the [place] one drink equals one 12 oz bottle of beer (355 mL), one 5 oz glass of wine (148 mL), or one 1 oz glass of hard liquor (44 mL). Lifestyle  Work with your health care provider to maintain a healthy body weight or to lose weight. Ask what an ideal weight is for you. Get at least 30 minutes of exercise that causes your heart to beat faster (aerobic exercise) most days of the week. Activities may include walking, swimming, or biking. Include exercise to strengthen your muscles (resistance exercise), such as Pilates or lifting weights, as part of your weekly exercise routine. Try to do these types of exercises for 30 minutes at least 3 days a week. Do not use any products that contain nicotine or tobacco. These products include cigarettes, chewing tobacco, and vaping devices, such as e-cigarettes. If you need help quitting, ask your health care provider. Monitor your blood pressure at home as told by your health care provider. Keep all follow-up visits. This is important. Medicines Take over-the-counter and prescription medicines only as told by your health care provider. Follow directions carefully. Blood  pressure medicines must be taken as prescribed. Do not skip doses of blood pressure medicine. Doing this puts you at risk for problems and can make the medicine less effective. Ask your health care provider about side effects or reactions to medicines that you should watch for. Contact a health care provider if you: Think you are having a reaction to a medicine you are taking. Have headaches that keep coming back (recurring). Feel dizzy. Have swelling in your ankles. Have trouble with your vision. Get help right away if you: Develop a severe headache or confusion. Have unusual weakness or numbness. Feel faint. Have severe pain in your chest or abdomen. Vomit repeatedly. Have trouble breathing. These symptoms may be an emergency. Get help right away. Call 911. Do not wait to see if the symptoms will go away. Do not drive yourself to the hospital. Summary Hypertension is when the force of blood pumping through your arteries is too strong. If this condition is not controlled, it may put you at risk for serious complications. Your personal target blood pressure may vary depending on your medical conditions, your age, and other factors. For most people, a normal blood pressure is less than 120/80. Hypertension is treated with lifestyle changes, medicines, or a combination of both. Lifestyle changes include losing weight, eating a healthy,   low-sodium diet, exercising more, and limiting alcohol. This information is not intended to replace advice given to you by your health care provider. Make sure you discuss any questions you have with your health care provider. Document Revised: 04/12/2021 Document Reviewed: 04/12/2021 Elsevier Patient Education  2023 [employer]  

## 2021-11-08 DIAGNOSIS — H25813 Combined forms of age-related cataract, bilateral: Secondary | ICD-10-CM | POA: Diagnosis not present

## 2021-11-08 DIAGNOSIS — H04123 Dry eye syndrome of bilateral lacrimal glands: Secondary | ICD-10-CM | POA: Diagnosis not present

## 2021-11-08 DIAGNOSIS — H35363 Drusen (degenerative) of macula, bilateral: Secondary | ICD-10-CM | POA: Diagnosis not present

## 2021-12-02 DIAGNOSIS — L308 Other specified dermatitis: Secondary | ICD-10-CM | POA: Diagnosis not present

## 2021-12-02 DIAGNOSIS — X32XXXD Exposure to sunlight, subsequent encounter: Secondary | ICD-10-CM | POA: Diagnosis not present

## 2021-12-02 DIAGNOSIS — Z85820 Personal history of malignant melanoma of skin: Secondary | ICD-10-CM | POA: Diagnosis not present

## 2021-12-02 DIAGNOSIS — Z1283 Encounter for screening for malignant neoplasm of skin: Secondary | ICD-10-CM | POA: Diagnosis not present

## 2021-12-02 DIAGNOSIS — D225 Melanocytic nevi of trunk: Secondary | ICD-10-CM | POA: Diagnosis not present

## 2021-12-02 DIAGNOSIS — L570 Actinic keratosis: Secondary | ICD-10-CM | POA: Diagnosis not present

## 2021-12-02 DIAGNOSIS — Z08 Encounter for follow-up examination after completed treatment for malignant neoplasm: Secondary | ICD-10-CM | POA: Diagnosis not present

## 2021-12-02 DIAGNOSIS — Z85828 Personal history of other malignant neoplasm of skin: Secondary | ICD-10-CM | POA: Diagnosis not present

## 2021-12-15 DIAGNOSIS — Z974 Presence of external hearing-aid: Secondary | ICD-10-CM | POA: Diagnosis not present

## 2021-12-15 DIAGNOSIS — H6123 Impacted cerumen, bilateral: Secondary | ICD-10-CM | POA: Diagnosis not present

## 2022-03-15 DIAGNOSIS — B078 Other viral warts: Secondary | ICD-10-CM | POA: Diagnosis not present

## 2022-03-15 DIAGNOSIS — L570 Actinic keratosis: Secondary | ICD-10-CM | POA: Diagnosis not present

## 2022-03-15 DIAGNOSIS — D225 Melanocytic nevi of trunk: Secondary | ICD-10-CM | POA: Diagnosis not present

## 2022-03-15 DIAGNOSIS — Z85820 Personal history of malignant melanoma of skin: Secondary | ICD-10-CM | POA: Diagnosis not present

## 2022-03-15 DIAGNOSIS — Z08 Encounter for follow-up examination after completed treatment for malignant neoplasm: Secondary | ICD-10-CM | POA: Diagnosis not present

## 2022-03-15 DIAGNOSIS — D0461 Carcinoma in situ of skin of right upper limb, including shoulder: Secondary | ICD-10-CM | POA: Diagnosis not present

## 2022-03-15 DIAGNOSIS — X32XXXD Exposure to sunlight, subsequent encounter: Secondary | ICD-10-CM | POA: Diagnosis not present

## 2022-03-15 DIAGNOSIS — Z1283 Encounter for screening for malignant neoplasm of skin: Secondary | ICD-10-CM | POA: Diagnosis not present

## 2022-03-17 ENCOUNTER — Encounter (HOSPITAL_COMMUNITY): Payer: Self-pay

## 2022-03-17 ENCOUNTER — Ambulatory Visit (HOSPITAL_COMMUNITY)
Admission: EM | Admit: 2022-03-17 | Discharge: 2022-03-17 | Disposition: A | Discharge status: Home / Self Care | Payer: Medicare Other | Attending: Internal Medicine | Admitting: Internal Medicine

## 2022-03-17 DIAGNOSIS — L03113 Cellulitis of right upper limb: Secondary | ICD-10-CM | POA: Diagnosis not present

## 2022-03-17 MED ORDER — MUPIROCIN 2 % EX OINT: 1.0000 | TOPICAL_OINTMENT | Freq: Two times a day (BID) | CUTANEOUS | 0 refills | Status: DC

## 2022-03-17 MED ORDER — AMOXICILLIN-POT CLAVULANATE 875-125 MG PO TABS: 1.0000 | ORAL_TABLET | Freq: Two times a day (BID) | ORAL | 0 refills | Status: DC

## 2022-03-17 MED ORDER — PROBIOTIC ADVANCED PO CAPS: ORAL_CAPSULE | ORAL | 0 refills | Status: DC

## 2022-03-17 NOTE — ED Triage Notes (Signed)
Pt is here for a possible infection on right hand that, is red , warm touch and swollen. Pt recently had something removed (procedure ) x 2days ago

## 2022-03-17 NOTE — ED Provider Notes (Signed)
[name]    CSN: [id number] Arrival date & time: 03/17/22  1742      History   Chief Complaint Chief Complaint  Patient presents with   Hand Problem    HPI Russell [name] is a 76 y.o. male.   Patient presents to urgent care for evaluation of infected wound to the right hand after dermatology procedure 2 days ago. Reports history of melanoma and basal cell/squamous cell carcinomas.  States there is significant swelling to the dorsal aspect of the right hand as well as redness that causes his hand to become itchy intermittently.  No pain associated with infection.  Denies numbness and tingling to the bilateral upper extremities, fever/chills, redness extending proximally up the forearm, and decreased range of motion to the right hand.  He has been keeping the wound covered and has attempted use of neomycin ointment without relief of symptoms.     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   Mild concentric left ventricular hypertrophy (LVH) 09/24/2017   Hypertension 07/02/2017   Squamous cell skin cancer 03/23/2016   Osteoarthritis of left knee 04/30/2013    Class: Diagnosis of   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 Isaiah Blakes at [facility]   left knee arthroscopy  03/2007   MOHS SURGERY     November 21,2022- lower back   PATELLAR TENDON REPAIR Left 05/14/2013   Procedure: Left Total Knee Arthroplasty Medial Retinaculum Repair;  Surgeon: Marybelle Killings, MD;  Location: [place];  Service: Orthopedics;  Laterality: Left;  Left Total Knee Arthroplasty Medial Retinaculum Repair   POLYPECTOMY     skin cancer removed Left 2016   shoulder    skin cancer removed N/A 02/22/2016   from top of his head.  this was done at [place] Medications    Prior to Admission medications   Medication Sig Start Date End Date Taking? Authorizing Provider  amoxicillin-clavulanate (AUGMENTIN) 875-125 MG tablet Take 1 tablet by mouth every 12 (twelve) hours. 03/17/22  Yes Talbot Grumbling, FNP  mupirocin ointment (BACTROBAN) 2 % Apply 1 Application topically 2 (two) times daily. 03/17/22  Yes Talbot Grumbling, FNP  Probiotic Product (PROBIOTIC  ADVANCED) CAPS Take once daily while taking antibiotic. 03/17/22  Yes [name], Stasia Cavalier, FNP  Ascorbic Acid (VITAMIN C PO) Take 500 mg by mouth daily.    [provider]  aspirin EC 81 MG tablet Take 81 mg by mouth daily. Takes 3 times weekly    [provider]  losartan (COZAAR) 100 MG tablet Take 1 tablet (100 mg total) by mouth daily. 11/07/21   Janith Lima, MD  simvastatin (ZOCOR) 40 MG tablet TAKE ONE TABLET EVERY EVENING 10/06/21   Janith Lima, MD    Family History Family History  Problem Relation Age of Onset   Colon cancer Maternal Grandfather    Esophageal cancer Neg Hx    Rectal cancer Neg Hx    Stomach cancer Neg Hx     Social  History Social History   Tobacco Use   Smoking status: Light Smoker    Types: Cigars   Smokeless tobacco: Never   Tobacco comments:    cigars once weekly  Vaping Use   Vaping Use: Never used  Substance Use Topics   Alcohol use: Yes    Alcohol/week: 14.0 standard drinks of alcohol    Types: 7 Glasses of wine, 7 Cans of beer per week    Comment: daily use   Drug use: No     Allergies   Patient has no known allergies.   Review of Systems Review of Systems Per HPI  Physical Exam Triage Vital Signs ED Triage Vitals  Enc Vitals Group     BP 03/17/22 1811 (!) 165/93     Pulse Rate 03/17/22 1811 (!) 56     Resp 03/17/22 1811 12     Temp 03/17/22 1811 98.6 F (37 C)     Temp Source 03/17/22 1811 Oral     SpO2 03/17/22 1811 96 %     Weight 03/17/22 1814 186 lb (84.4 kg)     Height 03/17/22 1814 '5\' 11"'$  (1.803 m)     Head Circumference --      Peak Flow --      Pain Score 03/17/22 1814 0     Pain Loc --      Pain Edu? --      Excl. in [place]? --    No data found.  Updated Vital Signs BP (!) 165/93 (BP Location: Right Arm)   Pulse (!) 56   Temp 98.6 F (37 C) (Oral)   Resp 12   Ht '5\' 11"'$  (1.803 m)   Wt 186 lb (84.4 kg)   SpO2 96%   BMI 25.94 kg/m   Visual Acuity Right Eye Distance:   Left Eye Distance:   Bilateral Distance:    Right Eye Near:   Left Eye Near:    Bilateral Near:     Physical Exam Vitals and nursing note reviewed.  Constitutional:      Appearance: He is not ill-appearing or toxic-appearing.  HENT:     Head: Normocephalic and atraumatic.     Right Ear: Hearing and external ear normal.     Left Ear: Hearing and external ear normal.     Nose: Nose normal.     Mouth/Throat:     Lips: Pink.  Eyes:     General: Lids are normal. Vision grossly intact. Gaze aligned appropriately.     Extraocular Movements: Extraocular movements intact.     Conjunctiva/sclera: Conjunctivae normal.  Pulmonary:     Effort: Pulmonary effort is normal.   Musculoskeletal:  Cervical back: Neck supple.  Skin:    General: Skin is warm and dry.     Capillary Refill: Capillary refill takes less than 2 seconds.     Findings: Erythema present.     Comments: Significant soft tissue swelling and erythema present to the dorsal aspect of the right hand with central wound present that is nondraining.  Hand is warm to the touch.  +2 radial pulse to the right wrist.  Capillary refill is less than 3.  Sensation intact distally to erythema, warmth, and swelling.  See image below for further detail.  Neurological:     General: No focal deficit present.     Mental Status: He is alert and oriented to person, place, and time. Mental status is at baseline.     Cranial Nerves: No dysarthria or facial asymmetry.  Psychiatric:        Mood and Affect: Mood normal.        Speech: Speech normal.        Behavior: Behavior normal.        Thought Content: Thought content normal.        Judgment: Judgment normal.        UC Treatments / Results  Labs (all labs ordered are listed, but only abnormal results are displayed) Labs Reviewed - No data to display  EKG   Radiology No results found.  Procedures Procedures (including critical care time)  Medications Ordered in UC Medications - No data to display  Initial Impression / Assessment and Plan / UC Course  I have reviewed the triage vital signs and the nursing notes.  Pertinent labs & imaging results that were available during my care of the patient were reviewed by me and considered in my medical decision making (see chart for details).   1.  Cellulitis of the right upper extremity Dorsal aspect of the right hand appears to be very infected as a result of recent dermatology procedure.  Patient denies allergies to antibiotics.  We will start Augmentin twice daily for the next 7 days to be taken with a probiotic to prevent/decrease risk of diarrhea associated with antibiotic use.  We will use topical  mupirocin ointment as well twice daily for the next 7 days to the central wound.  Warm Epsom salt soaks frequently throughout the day advised.  Patient states that he should be able to get in with his dermatologist if he calls them to schedule an appointment on Monday prior to leaving for [place] next Friday (in 7 days) on March 24, 2022.  Erythema outlined with skin marker and patient advised to return to urgent care or go to PCP sooner than dermatology appointment if erythema spreads past the outlined area of erythema with a skin marker despite antibiotic use in the next couple of days.  Patient is agreeable with this plan.  Discussed physical exam and available lab work findings in clinic with patient.  Counseled patient regarding appropriate use of medications and potential side effects for all medications recommended or prescribed today. Discussed red flag signs and symptoms of worsening condition,when to call the PCP office, return to urgent care, and when to seek higher level of care in the emergency department. Patient verbalizes understanding and agreement with plan. All questions answered. Patient discharged in stable condition.     Final Clinical Impressions(s) / UC Diagnoses   Final diagnoses:  Cellulitis of right upper extremity     Discharge Instructions      Take Augmentin twice  daily for the next 7 days with food.  You may also take probiotic daily while taking antibiotic to prevent/lessen diarrhea associated with antibiotic use.   Place mupirocin ointment to the wound twice daily for the next 7 days.  Epsom salt soaks or warm compresses a few times a day will help with swelling.  I have outlined your redness on your hand.  If you notice that the redness extends past this area outlined in the next few days despite antibiotic use, I would like for you to come back to urgent care to be reevaluated.  Otherwise, you may call your dermatologist on Monday to schedule an appointment  for either Monday or Tuesday to have this site reevaluated.  If symptoms become severe, go to the nearest emergency department. I hope this gets better and that you have a fantastic time in [place]!      ED Prescriptions     Medication Sig Dispense Auth. Provider   amoxicillin-clavulanate (AUGMENTIN) 875-125 MG tablet Take 1 tablet by mouth every 12 (twelve) hours. 14 tablet Talbot Grumbling, FNP   Probiotic Product (PROBIOTIC ADVANCED) CAPS Take once daily while taking antibiotic. 14 capsule Talbot Grumbling, FNP   mupirocin ointment (BACTROBAN) 2 % Apply 1 Application topically 2 (two) times daily. 22 g Talbot Grumbling, FNP      PDMP not reviewed this encounter.   Talbot Grumbling, [place] 03/17/22 1925

## 2022-03-17 NOTE — Discharge Instructions (Addendum)
Take Augmentin twice daily for the next 7 days with food.  You may also take probiotic daily while taking antibiotic to prevent/lessen diarrhea associated with antibiotic use.   Place mupirocin ointment to the wound twice daily for the next 7 days.  Epsom salt soaks or warm compresses a few times a day will help with swelling.  I have outlined your redness on your hand.  If you notice that the redness extends past this area outlined in the next few days despite antibiotic use, I would like for you to come back to urgent care to be reevaluated.  Otherwise, you may call your dermatologist on Monday to schedule an appointment for either Monday or Tuesday to have this site reevaluated.  If symptoms become severe, go to the nearest emergency department. I hope this gets better and that you have a fantastic time in [place]!

## 2022-04-06 ENCOUNTER — Other Ambulatory Visit: Payer: Self-pay | Admitting: Internal Medicine

## 2022-04-06 DIAGNOSIS — E785 Hyperlipidemia, unspecified: Secondary | ICD-10-CM

## 2022-04-12 ENCOUNTER — Encounter: Payer: Self-pay | Admitting: Internal Medicine

## 2022-04-12 ENCOUNTER — Ambulatory Visit (INDEPENDENT_AMBULATORY_CARE_PROVIDER_SITE_OTHER): Payer: Medicare Other | Admitting: Internal Medicine

## 2022-04-12 VITALS — BP 138/86 | HR 50 | Temp 97.7°F | Ht 71.0 in | Wt 189.0 lb

## 2022-04-12 DIAGNOSIS — Z23 Encounter for immunization: Secondary | ICD-10-CM

## 2022-04-12 DIAGNOSIS — N401 Enlarged prostate with lower urinary tract symptoms: Secondary | ICD-10-CM

## 2022-04-12 DIAGNOSIS — R0683 Snoring: Secondary | ICD-10-CM | POA: Diagnosis not present

## 2022-04-12 DIAGNOSIS — N138 Other obstructive and reflux uropathy: Secondary | ICD-10-CM

## 2022-04-12 DIAGNOSIS — E785 Hyperlipidemia, unspecified: Secondary | ICD-10-CM

## 2022-04-12 DIAGNOSIS — R001 Bradycardia, unspecified: Secondary | ICD-10-CM | POA: Diagnosis not present

## 2022-04-12 DIAGNOSIS — E7800 Pure hypercholesterolemia, unspecified: Secondary | ICD-10-CM | POA: Diagnosis not present

## 2022-04-12 DIAGNOSIS — R739 Hyperglycemia, unspecified: Secondary | ICD-10-CM | POA: Diagnosis not present

## 2022-04-12 DIAGNOSIS — I10 Essential (primary) hypertension: Secondary | ICD-10-CM | POA: Diagnosis not present

## 2022-04-12 DIAGNOSIS — I517 Cardiomegaly: Secondary | ICD-10-CM | POA: Diagnosis not present

## 2022-04-12 LAB — CBC WITH DIFFERENTIAL/PLATELET
Basophils Absolute: 0 10*3/uL (ref 0.0–0.1)
Basophils Relative: 0.5 % (ref 0.0–3.0)
Eosinophils Absolute: 0.3 10*3/uL (ref 0.0–0.7)
Eosinophils Relative: 3.5 % (ref 0.0–5.0)
HCT: 47.1 % (ref 39.0–52.0)
Hemoglobin: 15.6 g/dL (ref 13.0–17.0)
Lymphocytes Relative: 26.1 % (ref 12.0–46.0)
Lymphs Abs: 1.9 10*3/uL (ref 0.7–4.0)
MCHC: 33.1 g/dL (ref 30.0–36.0)
MCV: 98.6 fl (ref 78.0–100.0)
Monocytes Absolute: 0.5 10*3/uL (ref 0.1–1.0)
Monocytes Relative: 6.9 % (ref 3.0–12.0)
Neutro Abs: 4.6 10*3/uL (ref 1.4–7.7)
Neutrophils Relative %: 63 % (ref 43.0–77.0)
Platelets: 199 10*3/uL (ref 150.0–400.0)
RBC: 4.78 Mil/uL (ref 4.22–5.81)
RDW: 13.4 % (ref 11.5–15.5)
WBC: 7.3 10*3/uL (ref 4.0–10.5)

## 2022-04-12 LAB — URINALYSIS, ROUTINE W REFLEX MICROSCOPIC
Bilirubin Urine: NEGATIVE
Hgb urine dipstick: NEGATIVE
Ketones, ur: NEGATIVE
Leukocytes,Ua: NEGATIVE
Nitrite: NEGATIVE
RBC / HPF: NONE SEEN (ref 0–?)
Specific Gravity, Urine: 1.01 (ref 1.000–1.030)
Total Protein, Urine: NEGATIVE
Urine Glucose: NEGATIVE
Urobilinogen, UA: 0.2 (ref 0.0–1.0)
pH: 6 (ref 5.0–8.0)

## 2022-04-12 LAB — BASIC METABOLIC PANEL
BUN: 16 mg/dL (ref 6–23)
CO2: 27 mEq/L (ref 19–32)
Calcium: 9.3 mg/dL (ref 8.4–10.5)
Chloride: 105 mEq/L (ref 96–112)
Creatinine, Ser: 0.81 mg/dL (ref 0.40–1.50)
GFR: 87.06 mL/min (ref >60.00)
Glucose, Bld: 99 mg/dL (ref 70–99)
Potassium: 4.4 mEq/L (ref 3.5–5.1)
Sodium: 140 mEq/L (ref 135–145)

## 2022-04-12 LAB — HEPATIC FUNCTION PANEL
ALT: 21 U/L (ref 0–53)
AST: 20 U/L (ref 0–37)
Albumin: 4.4 g/dL (ref 3.5–5.2)
Alkaline Phosphatase: 60 U/L (ref 39–117)
Bilirubin, Direct: 0.2 mg/dL (ref 0.0–0.3)
Total Bilirubin: 1.1 mg/dL (ref 0.2–1.2)
Total Protein: 6.7 g/dL (ref 6.0–8.3)

## 2022-04-12 LAB — LIPID PANEL
Cholesterol: 155 mg/dL (ref 0–200)
HDL: 65.5 mg/dL (ref >39.00)
LDL Cholesterol: 82 mg/dL (ref 0–99)
NonHDL: 89.85
Total CHOL/HDL Ratio: 2
Triglycerides: 41 mg/dL (ref 0.0–149.0)
VLDL: 8.2 mg/dL (ref 0.0–40.0)

## 2022-04-12 LAB — HEMOGLOBIN A1C: Hgb A1c MFr Bld: 6.1 % (ref 4.6–6.5)

## 2022-04-12 LAB — TSH: TSH: 0.98 u[IU]/mL (ref 0.35–5.50)

## 2022-04-12 LAB — PSA: PSA: 1.56 ng/mL (ref 0.10–4.00)

## 2022-04-12 MED ORDER — SHINGRIX 50 MCG/0.5ML IM SUSR: 0.5000 mL | Freq: Once | INTRAMUSCULAR | 1 refills | Status: AC

## 2022-04-12 NOTE — Patient Instructions (Signed)
Bradycardia, Adult Bradycardia is a slower-than-normal heartbeat. A normal resting heart rate for an adult ranges from 60 to 100 beats per minute. With bradycardia, the resting heart rate is less than 60 beats per minute. Bradycardia can prevent enough oxygen from reaching certain areas of your body when you are active. It can be serious if it keeps enough oxygen from reaching your brain and other parts of your body. Bradycardia is not a problem for everyone. For some healthy adults, a slow resting heart rate is normal. What are the causes? This condition may be caused by: A problem with the heart, including: A problem with the heart's electrical system, such as a heart block. With a heart block, electrical signals between the chambers of the heart are partially or completely blocked, so they are not able to work as they should. A problem with the heart's natural pacemaker (sinus node). Heart disease. A heart attack. Heart damage. Lyme disease. A heart infection. A heart condition that is present at birth (congenital heart defect). Certain medicines that treat heart conditions. Certain conditions, such as hypothyroidism and obstructive sleep apnea. Problems with the balance of chemicals and other substances, like potassium, in the blood. Trauma. Radiation therapy. What increases the risk? You are more likely to develop this condition if you: Are age 65 or older. Have high blood pressure (hypertension), high cholesterol (hyperlipidemia), or diabetes. Drink heavily, use tobacco or nicotine products, or use drugs. What are the signs or symptoms? Symptoms of this condition include: Light-headedness. Feeling faint or fainting. Fatigue and weakness. Trouble with activity or exercise. Shortness of breath. Chest pain (angina). Drowsiness. Confusion. Dizziness. How is this diagnosed? This condition may be diagnosed based on: Your symptoms. Your medical history. A physical exam. During  the exam, your health care provider will listen to your heartbeat and check your pulse. To confirm the diagnosis, your health care provider may order tests, such as: Blood tests. An electrocardiogram (ECG). This test records the heart's electrical activity. The test can show how fast your heart is beating and whether the heartbeat is steady. A test in which you wear a portable device (event recorder or Holter monitor) to record your heart's electrical activity while you go about your day. An exercise test. How is this treated? Treatment for this condition depends on the cause of the condition and how severe your symptoms are. Treatment may involve: Treatment of the underlying condition. Changing your medicines or how much medicine you take. Having a small, battery-operated device called a pacemaker implanted under the skin. When bradycardia occurs, this device can be used to increase your heart rate and help your heart beat in a regular rhythm. Follow these instructions at home: Lifestyle Manage any health conditions that contribute to bradycardia as told by your health care provider. Follow a heart-healthy diet. A nutrition specialist (dietitian) can help educate you about healthy food options and changes. Follow an exercise program that is approved by your health care provider. Maintain a healthy weight. Try to reduce or manage your stress, such as with yoga or meditation. If you need help reducing stress, ask your health care provider. Do not use any products that contain nicotine or tobacco. These products include cigarettes, chewing tobacco, and vaping devices, such as e-cigarettes. If you need help quitting, ask your health care provider. Do not use illegal drugs. Alcohol use If you drink alcohol: Limit how much you have to: 0-1 drink a day for women who are not pregnant. 0-2 drinks a day   for men. Know how much alcohol is in a drink. In the [place] one drink equals one 12 oz bottle of  beer (355 mL), one 5 oz glass of wine (148 mL), or one 1 oz glass of hard liquor (44 mL). General instructions Take over-the-counter and prescription medicines only as told by your health care provider. Keep all follow-up visits. This is important. How is this prevented? In some cases, bradycardia may be prevented by: Treating underlying medical problems. Stopping behaviors or medicines that can trigger the condition. Contact a health care provider if: You feel light-headed or dizzy. You almost faint. You feel weak or are easily fatigued during physical activity. You experience confusion or have memory problems. Get help right away if: You faint. You have chest pains or an irregular heartbeat (palpitations). You have trouble breathing. These symptoms may represent a serious problem that is an emergency. Do not wait to see if the symptoms will go away. Get medical help right away. Call your local emergency services (911 in the [place] Do not drive yourself to the hospital. Summary Bradycardia is a slower-than-normal heartbeat. With bradycardia, the resting heart rate is less than 60 beats per minute. Treatment for this condition depends on the cause. Manage any health conditions that contribute to bradycardia as told by your health care provider. Do not use any products that contain nicotine or tobacco. These products include cigarettes, chewing tobacco, and vaping devices, such as e-cigarettes. Keep all follow-up visits. This is important. This information is not intended to replace advice given to you by your health care provider. Make sure you discuss any questions you have with your health care provider. Document Revised: 09/26/2020 Document Reviewed: 09/26/2020 Elsevier Patient Education  2023 Elsevier Inc.  

## 2022-04-12 NOTE — Progress Notes (Unsigned)
Subjective:  Patient ID: Russell [name], male    DOB: 08/04/1945  Age: 76 y.o. MRN: [id number]  CC: Hypertension   HPI Russell [name] presents for f/up -  His wife complains about his snoring and she has witnessed apnea. 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 He denies CP, DOE, edema, fatigue, near syncope.  Outpatient Medications Prior to Visit  Medication Sig Dispense Refill   Ascorbic Acid (VITAMIN C PO) Take 500 mg by mouth daily.     aspirin EC 81 MG tablet Take 81 mg by mouth daily. Takes 3 times weekly     losartan (COZAAR) 100 MG tablet Take 1 tablet (100 mg total) by mouth daily. 90 tablet 1   Probiotic Product (PROBIOTIC ADVANCED) CAPS Take once daily while taking antibiotic. 14 capsule 0   simvastatin (ZOCOR) 40 MG tablet TAKE ONE TABLET EVERY EVENING 90 tablet 0   amoxicillin-clavulanate (AUGMENTIN) 875-125 MG tablet Take 1 tablet by mouth every 12 (twelve) hours. 14 tablet 0   mupirocin ointment (BACTROBAN) 2 % Apply 1 Application topically 2 (two) times daily. 22 g 0   No facility-administered medications prior to visit.    ROS Review of Systems  Constitutional:  Negative for appetite change, chills, diaphoresis, fatigue and fever.  HENT: Negative.    Respiratory:  Positive for apnea. Negative for cough, chest tightness, shortness of breath and wheezing.   Cardiovascular:  Negative for chest pain, palpitations and leg swelling.  Gastrointestinal:  Negative for abdominal pain, diarrhea, nausea and vomiting.  Genitourinary: Negative.  Negative for difficulty urinating.  Musculoskeletal: Negative.   Skin: Negative.   Neurological:  Negative for dizziness, syncope, weakness, light-headedness and numbness.  Hematological:  Negative for adenopathy. Does not bruise/bleed easily.  Psychiatric/Behavioral: Negative.      Objective:  BP 138/86 (BP Location: Left Arm, Patient Position: Sitting, Cuff Size: Large)   Pulse (!) 50   Temp 97.7 F (36.5 C)  (Oral)   Ht '5\' 11"'$  (1.803 m)   Wt 189 lb (85.7 kg)   SpO2 95%   BMI 26.36 kg/m   BP Readings from Last 3 Encounters:  04/12/22 138/86  03/17/22 (!) 165/93  11/07/21 118/68    Wt Readings from Last 3 Encounters:  04/12/22 189 lb (85.7 kg)  03/17/22 186 lb (84.4 kg)  11/07/21 192 lb (87.1 kg)    Physical Exam Vitals reviewed.  HENT:     Mouth/Throat:     Mouth: Mucous membranes are moist.  Eyes:     General: No scleral icterus.    Conjunctiva/sclera: Conjunctivae normal.  Cardiovascular:     Rate and Rhythm: Regular rhythm. Bradycardia present.     Heart sounds: No murmur heard.    Comments: EKG - SB, 47 bpm Minimal LVH No Q waves unchanged Pulmonary:     Effort: Pulmonary effort is normal.     Breath sounds: No stridor. No wheezing, rhonchi or rales.  Abdominal:     General: Abdomen is flat.     Palpations: There is no mass.     Tenderness: There is no abdominal tenderness. There is no guarding.     Hernia: No hernia is present.  Musculoskeletal:        General: Normal range of motion.     Cervical back: Neck supple.     Right lower leg: No edema.     Left lower leg: No edema.  Lymphadenopathy:     Cervical: No cervical adenopathy.  Skin:    General: Skin is warm and dry.  Neurological:     General: No focal deficit present.     Mental Status: He is alert.  Psychiatric:        Mood and Affect: Mood normal.        Behavior: Behavior normal.     Lab Results  Component Value Date   WBC 7.3 04/12/2022   HGB 15.6 04/12/2022   HCT 47.1 04/12/2022   PLT 199.0 04/12/2022   GLUCOSE 99 04/12/2022   CHOL 155 04/12/2022   TRIG 41.0 04/12/2022   HDL 65.50 04/12/2022   LDLDIRECT 153.3 05/31/2009   LDLCALC 82 04/12/2022   ALT 21 04/12/2022   AST 20 04/12/2022   NA 140 04/12/2022   K 4.4 04/12/2022   CL 105 04/12/2022   CREATININE 0.81 04/12/2022   BUN 16 04/12/2022   CO2 27 04/12/2022   TSH 0.98 04/12/2022   PSA 1.56 04/12/2022   INR 1.00 05/14/2013    HGBA1C 6.1 04/12/2022    No results found.  Assessment & Plan:   Russell [name] was seen today for hypertension.  Diagnoses and all orders for this visit:  Primary hypertension- His blood pressure is well controlled. -     Basic metabolic panel; Future -     CBC with Differential/Platelet; Future -     Hepatic function panel; Future -     Urinalysis, Routine w reflex microscopic; Future -     TSH; Future -     EKG 12-Lead -     TSH -     Urinalysis, Routine w reflex microscopic -     Hepatic function panel -     CBC with Differential/Platelet -     Basic metabolic panel  Pure hypercholesterolemia- Will start a statin for CV risk reduction. -     Lipid panel; Future -     Hepatic function panel; Future -     Hepatic function panel -     Lipid panel  Chronic hyperglycemia - He is prediabetic. -     Basic metabolic panel; Future -     Hemoglobin A1c; Future -     Hepatic function panel; Future -     Urinalysis, Routine w reflex microscopic; Future -     Urinalysis, Routine w reflex microscopic -     Hepatic function panel -     Hemoglobin A1c -     Basic metabolic panel  Mild concentric left ventricular hypertrophy (LVH)  BPH with obstruction/lower urinary tract symptoms -     PSA; Future -     PSA  Bradycardia- No side effects related to this.   Loud snoring -     Ambulatory referral to Sleep Studies  Flu vaccine need -     Flu Vaccine QUAD High Dose(Fluad)  Other orders -     Zoster Vaccine Adjuvanted [facility]) injection; Inject 0.5 mLs into the muscle once for 1 dose.   I have discontinued Russell [name] "Russell [name]"'s amoxicillin-clavulanate and mupirocin ointment. I am also having him start on Shingrix and rosuvastatin. Additionally, I am having him maintain his Ascorbic Acid (VITAMIN C PO), aspirin EC, losartan, Probiotic Advanced, and simvastatin.  Meds ordered this encounter  Medications   Zoster Vaccine Adjuvanted [employer]) injection    Sig: Inject 0.5 mLs  into the muscle once for 1 dose.    Dispense:  0.5 mL    Refill:  1   rosuvastatin (CRESTOR) 5 MG tablet  Sig: Take 1 tablet (5 mg total) by mouth daily.    Dispense:  90 tablet    Refill:  1     Follow-up: Return in about 6 months (around 10/12/2022).  Scarlette Calico, MD

## 2022-04-13 DIAGNOSIS — E785 Hyperlipidemia, unspecified: Secondary | ICD-10-CM | POA: Insufficient documentation

## 2022-04-13 DIAGNOSIS — Z23 Encounter for immunization: Secondary | ICD-10-CM | POA: Insufficient documentation

## 2022-04-13 MED ORDER — ROSUVASTATIN CALCIUM 5 MG PO TABS: 5.0000 mg | ORAL_TABLET | Freq: Every day | ORAL | 1 refills | Status: DC

## 2022-04-17 DIAGNOSIS — C4362 Malignant melanoma of left upper limb, including shoulder: Secondary | ICD-10-CM | POA: Diagnosis not present

## 2022-04-19 ENCOUNTER — Other Ambulatory Visit: Payer: Self-pay | Admitting: Internal Medicine

## 2022-04-19 ENCOUNTER — Encounter: Payer: Self-pay | Admitting: Internal Medicine

## 2022-04-25 ENCOUNTER — Ambulatory Visit (INDEPENDENT_AMBULATORY_CARE_PROVIDER_SITE_OTHER): Payer: Medicare Other

## 2022-04-25 VITALS — Ht 71.0 in

## 2022-04-25 DIAGNOSIS — Z0000 Encounter for general adult medical examination without abnormal findings: Secondary | ICD-10-CM | POA: Diagnosis not present

## 2022-04-25 NOTE — Progress Notes (Signed)
Virtual Visit via Telephone Note  I connected with  [name] on 04/25/22 at  3:00 PM EST by telephone and verified that I am speaking with the correct person using two identifiers.  Location: Patient: Home Provider: [facility] [place] participating in the virtual visit: [place]   I discussed the limitations, risks, security and privacy concerns of performing an evaluation and management service by telephone and the availability of in person appointments. The patient expressed understanding and agreed to proceed.  Interactive audio and video telecommunications were attempted between this nurse and patient, however failed, due to patient having technical difficulties OR patient did not have access to video capability.  We continued and completed visit with audio only.  Some vital signs may be absent or patient reported.   Sheral Flow, LPN  Subjective:   Russell [name] is a 76 y.o. male who presents for an Initial Medicare Annual Wellness Visit.  Review of Systems     Cardiac Risk Factors include: advanced age (>50mn, >>30women);dyslipidemia;hypertension;male gender;smoking/ tobacco exposure     Objective:    Today's Vitals   04/25/22 1501  Height: '5\' 11"'$  (1.803 m)  PainSc: 0-No pain   Body mass index is 26.36 kg/m.     04/25/2022    3:05 PM 01/18/2016    8:17 AM 05/14/2013   12:26 PM 05/01/2013    7:40 PM 04/18/2013    8:28 AM  Advanced Directives  Does Patient Have a Medical Advance Directive? Yes Yes Patient has advance directive, copy not in chart Patient has advance directive, copy not in chart Patient has advance directive, copy not in chart  Type of Advance Directive [place] Healthcare Power of [place] will  Does patient want to make changes to medical advance directive?   No No   Copy of Healthcare  Power of Attorney in Chart? No - copy requested  Copy requested from family Copy requested from family   Pre-existing out of facility DNR order (yellow form or pink MOST form)    No No    Current Medications (verified) Outpatient Encounter Medications as of 04/25/2022  Medication Sig   Ascorbic Acid (VITAMIN C PO) Take 500 mg by mouth daily.   aspirin EC 81 MG tablet Take 81 mg by mouth daily. Takes 3 times weekly   losartan (COZAAR) 100 MG tablet Take 1 tablet (100 mg total) by mouth daily.   simvastatin (ZOCOR) 40 MG tablet TAKE ONE TABLET EVERY EVENING   Probiotic Product (PROBIOTIC ADVANCED) CAPS Take once daily while taking antibiotic. (Patient not taking: Reported on 04/25/2022)   No facility-administered encounter medications on file as of 04/25/2022.    Allergies (verified) Patient has no known allergies.   History: Past Medical History:  Diagnosis Date   Acute prostatitis    Arthritis    hands   Cancer ([place]    basal cell removed, melanoma October 2022   Dislocated patella 04/2013   S/P   ARTHROPLASTY   Hemorrhoids    History of skin cancer    Hx of colonic polyps    Hypertension    Kidney stone    passed stone - no surgery required   Other and unspecified hyperlipidemia    Smoker    Whole blood donor    Past Surgical History:  Procedure Laterality Date   COLONOSCOPY     EYE SURGERY  for scratced cornea   KNEE ARTHROPLASTY Left 04/30/2013   Procedure: COMPUTER ASSISTED TOTAL KNEE ARTHROPLASTY;  Surgeon: Marybelle Killings, MD;  Location: [facility];  Service: Orthopedics;  Laterality: Left;  Left Cemented Total Knee Arthroplasty   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 Isaiah Blakes [facility]   left knee arthroscopy  03/2007   MOHS SURGERY     November 21,2022- lower back   PATELLAR TENDON REPAIR Left 05/14/2013   Procedure: Left Total Knee Arthroplasty Medial Retinaculum Repair;  Surgeon: Marybelle Killings, MD;  Location: [facility];  Service: Orthopedics;   Laterality: Left;  Left Total Knee Arthroplasty Medial Retinaculum Repair   POLYPECTOMY     skin cancer removed Left 2016   shoulder    skin cancer removed N/A 02/22/2016   from top of his head.  this was done at [facility]     Family History  Problem Relation Age of Onset   Colon cancer Maternal Grandfather    Esophageal cancer Neg Hx    Rectal cancer Neg Hx    Stomach cancer Neg Hx    Social History   Socioeconomic History   Marital status: Married    Spouse name: Not on file   Number of children: Not on file   Years of education: Not on file   Highest education level: Not on file  Occupational History   Occupation: real Investment banker, corporate    Employer: [employer]  Tobacco Use   Smoking status: Light Smoker    Types: Cigars   Smokeless tobacco: Never   Tobacco comments:    cigars once weekly  Vaping Use   Vaping Use: Never used  Substance and Sexual Activity   Alcohol use: Yes    Alcohol/week: 14.0 standard drinks of alcohol    Types: 7 Glasses of wine, 7 Cans of beer per week    Comment: daily use   Drug use: No   Sexual activity: Not on file  Other Topics Concern   Not on file  Social History Narrative   Not on file   Social Determinants of Health   Financial Resource Strain: Low Risk  (04/25/2022)   Overall Financial Resource Strain (CARDIA)    Difficulty of Paying Living Expenses: Not hard at all  Food Insecurity: No Food Insecurity (04/25/2022)   Hunger Vital Sign    Worried About Running Out of Food in the Last Year: Never true    Ran Out of Food in the Last Year: Never true  Transportation Needs: No Transportation Needs (04/25/2022)   PRAPARE - Hydrologist (Medical): No    Lack of Transportation (Non-Medical): No  Physical Activity: Sufficiently Active (04/25/2022)   Exercise Vital Sign    Days of Exercise per Week: 7 days    Minutes of Exercise per Session: 60 min  Stress: No Stress  Concern Present (04/25/2022)   [place]    Feeling of Stress : Not at all  Social Connections: Moderately Integrated (04/25/2022)   Social Connection and Isolation Panel [NHANES]    Frequency of Communication with Friends and Family: More than three times a week    Frequency of Social Gatherings with Friends and Family: More than three times a week    Attends Religious Services: Never    Marine scientist or Organizations: Yes    Attends Archivist  Meetings: More than 4 times per year    Marital Status: Married    Tobacco Counseling Ready to quit: Not Answered Counseling given: Not Answered Tobacco comments: cigars once weekly   Clinical Intake:  Pre-visit preparation completed: Yes  Pain : No/denies pain Pain Score: 0-No pain     BMI - recorded: 26.37 (04/12/2022) Nutritional Status: BMI 25 -29 Overweight Nutritional Risks: None Diabetes: No  How often do you need to have someone help you when you read instructions, pamphlets, or other written materials from your doctor or pharmacy?: 1 - Never What is the last grade level you completed in school?: HSG  Diabetic? No  Interpreter Needed?: No  Information entered by :: Lisette Abu, LPN.   Activities of Daily Living    04/25/2022    3:18 PM  In your present state of health, do you have any difficulty performing the following activities:  Hearing? 1  Comment wears hearing aids  Vision? 0  Difficulty concentrating or making decisions? 0  Walking or climbing stairs? 0  Dressing or bathing? 0  Doing errands, shopping? 0  Preparing Food and eating ? N  Using the Toilet? N  In the past six months, have you accidently leaked urine? N  Do you have problems with loss of bowel control? N  Managing your Medications? N  Managing your Finances? N  Housekeeping or managing your Housekeeping? N    Patient Care Team: Janith Lima, MD  as PCP - General (Internal Medicine) Greta Doom, OD as Consulting Physician (Optometry)  Indicate any recent Medical Services you may have received from other than [facility] providers in the past year (date may be approximate).     Assessment:   This is a routine wellness examination for Russell [name].  Hearing/Vision screen Hearing Screening - Comments:: Patient wears hearing aids. Vision Screening - Comments:: Wears rx glasses and contacts - up to date with routine eye exams with [facility], OD.   Dietary issues and exercise activities discussed: Current Exercise Habits: Home exercise routine;Structured exercise class, Type of exercise: walking;treadmill;stretching;strength training/weights;exercise ball;calisthenics;Other - see comments (golf on Sundays), Time (Minutes): 60, Frequency (Times/Week): 7, Weekly Exercise (Minutes/Week): 420, Intensity: Moderate, Exercise limited by: orthopedic condition(s)   Goals Addressed             This Visit's Progress    To maintain my current health status by continuing to eat healthy, stay physically active and socially active.        Depression Screen    04/25/2022    3:18 PM 04/12/2022    9:16 AM 04/11/2021    9:48 AM 12/08/2019    8:27 AM 06/24/2018    3:31 PM 03/23/2016    9:14 AM 03/19/2014    9:00 AM  PHQ 2/9 Scores  PHQ - 2 Score 0 0 0 0 0 0 0  PHQ- 9 Score 0 0         Fall Risk    04/25/2022    3:07 PM 04/12/2022    9:15 AM 04/11/2021    9:48 AM 12/08/2019    8:17 AM 06/24/2018    3:31 PM  [place] in the past year? 0 0 0 0 0  Number falls in past yr: 0 0  0 0  Injury with Fall? 0 0  0 0  Risk for fall due to : No Fall Risks No Fall Risks  No Fall Risks   Follow up Falls prevention discussed Falls evaluation  completed  Falls evaluation completed Falls evaluation completed    FALL RISK PREVENTION PERTAINING TO THE HOME:  Any stairs in or around the home? Yes  If so, are there any without handrails? No  Home free of  loose throw rugs in walkways, pet beds, electrical cords, etc? Yes  Adequate lighting in your home to reduce risk of falls? Yes   ASSISTIVE DEVICES UTILIZED TO PREVENT FALLS:  Life alert? No  Use of a cane, walker or w/c? No  Grab bars in the bathroom? Yes  Shower chair or bench in shower? No  Elevated toilet seat or a handicapped toilet? No   TIMED UP AND GO:  Was the test performed? No . Phone Visit   Cognitive Function:        04/25/2022    3:19 PM  6CIT Screen  What Year? 0 points  What month? 0 points  What time? 0 points  Count back from 20 0 points  Months in reverse 0 points  Repeat phrase 0 points  Total Score 0 points    Immunizations Immunization History  Administered Date(s) Administered   Fluad Quad(high Dose 65+) 03/31/2019, 04/01/2020, 04/11/2021, 04/12/2022   Influenza Whole 02/25/2010, 02/18/2012   Influenza, High Dose Seasonal PF 04/01/2014, 03/23/2016, 03/19/2017, 03/26/2018   Influenza,inj,Quad PF,6+ Mos 03/24/2015   Influenza-Unspecified 02/17/2013   PFIZER(Purple Top)SARS-COV-2 Vaccination 07/25/2019, 08/15/2019   Pneumococcal Conjugate-13 05/20/2018   Pneumococcal Polysaccharide-23 03/19/2013, 12/08/2019   Tdap 06/19/2013    TDAP status: Up to date  Flu Vaccine status: Up to date  Pneumococcal vaccine status: Up to date  Covid-19 vaccine status: Completed vaccines  Qualifies for Shingles Vaccine? Yes   Zostavax completed No   Shingrix Completed?: No.    Education has been provided regarding the importance of this vaccine. Patient has been advised to call insurance company to determine out of pocket expense if they have not yet received this vaccine. Advised may also receive vaccine at local pharmacy or Health Dept. Verbalized acceptance and understanding.  Screening Tests Health Maintenance  Topic Date Due   Zoster Vaccines- Shingrix (1 of 2) Never done   COVID-19 Vaccine (3 - Pfizer risk series) 09/12/2019   Medicare Annual  Wellness (AWV)  04/26/2023   TETANUS/TDAP  06/20/2023   COLONOSCOPY (Pts 45-91yr Insurance coverage will need to be confirmed)  06/28/2026   Pneumonia Vaccine 74 Years old  Completed   INFLUENZA VACCINE  Completed   Hepatitis C Screening  Completed   HPV VACCINES  Aged Out    Health Maintenance  Health Maintenance Due  Topic Date Due   Zoster Vaccines- Shingrix (1 of 2) Never done   COVID-19 Vaccine (3 - Pfizer risk series) 09/12/2019    Colorectal cancer screening: Type of screening: Colonoscopy. Completed 06/28/2021. Repeat every 5 years  Lung Cancer Screening: (Low Dose CT Chest recommended if Age 74-80years, 30 pack-year currently smoking OR have quit w/in 15years.) does not qualify.   Lung Cancer Screening Referral: no  Additional Screening:  Hepatitis C Screening: does qualify; Completed 06/24/2018  Vision Screening: Recommended annual ophthalmology exams for early detection of glaucoma and other disorders of the eye. Is the patient up to date with their annual eye exam?  Yes  Who is the provider or what is the name of the office in which the patient attends annual eye exams? [facility]. If pt is not established with a provider, would they like to be referred to a provider to establish care? No .  Dental Screening: Recommended annual dental exams for proper oral hygiene  Community Resource Referral / Chronic Care Management: CRR required this visit?  No   CCM required this visit?  No      Plan:     I have personally reviewed and noted the following in the patient's chart:   Medical and social history Use of alcohol, tobacco or illicit drugs  Current medications and supplements including opioid prescriptions. Patient is not currently taking opioid prescriptions. Functional ability and status Nutritional status Physical activity Advanced directives List of other physicians Hospitalizations, surgeries, and ER visits in previous 12  months Vitals Screenings to include cognitive, depression, and falls Referrals and appointments  In addition, I have reviewed and discussed with patient certain preventive protocols, quality metrics, and best practice recommendations. A written personalized care plan for preventive services as well as general preventive health recommendations were provided to patient.     Sheral Flow, LPN   24/10/8097   Nurse Notes: N/A

## 2022-04-25 NOTE — Patient Instructions (Signed)
Russell [name] , Thank you for taking time to come for your Medicare Wellness Visit. I appreciate your ongoing commitment to your health goals. Please review the following plan we discussed and let me know if I can assist you in the future.   These are the goals we discussed:  Goals      To maintain my current health status by continuing to eat healthy, stay physically active and socially active.        This is a list of the screening recommended for you and due dates:  Health Maintenance  Topic Date Due   Zoster (Shingles) Vaccine (1 of 2) Never done   COVID-19 Vaccine (6 - Pfizer risk series) 06/23/2021   Medicare Annual Wellness Visit  04/26/2023   Tetanus Vaccine  06/20/2023   Colon Cancer Screening  06/28/2026   Pneumonia Vaccine  Completed   Flu Shot  Completed   Hepatitis C Screening: USPSTF Recommendation to screen - Ages 18-79 yo.  Completed   HPV Vaccine  Aged Out    Advanced directives: Yes; Please bring a copy of your health care power of attorney and living will to the office at your convenience.  Conditions/risks identified: Yes; To maintain my current health status by continuing to eat healthy, stay physically active and socially active.  Next appointment: Follow up in one year for your annual wellness visit. Please call [phone] to schedule with Nurse Mignon Pine via phone or office visit.  Preventive Care 74 Years and Older, Male  Preventive care refers to lifestyle choices and visits with your health care provider that can promote health and wellness. What does preventive care include? A yearly physical exam. This is also called an annual well check. Dental exams once or twice a year. Routine eye exams. Ask your health care provider how often you should have your eyes checked. Personal lifestyle choices, including: Daily care of your teeth and gums. Regular physical activity. Eating a healthy diet. Avoiding tobacco and drug use. Limiting alcohol  use. Practicing safe sex. Taking low doses of aspirin every day. Taking vitamin and mineral supplements as recommended by your health care provider. What happens during an annual well check? The services and screenings done by your health care provider during your annual well check will depend on your age, overall health, lifestyle risk factors, and family history of disease. Counseling  Your health care provider may ask you questions about your: Alcohol use. Tobacco use. Drug use. Emotional well-being. Home and relationship well-being. Sexual activity. Eating habits. History of falls. Memory and ability to understand (cognition). Work and work Statistician. Screening  You may have the following tests or measurements: Height, weight, and BMI. Blood pressure. Lipid and cholesterol levels. These may be checked every 5 years, or more frequently if you are over 74 years old. Skin check. Lung cancer screening. You may have this screening every year starting at age 74 if you have a 30-pack-year history of smoking and currently smoke or have quit within the past 15 years. Fecal occult blood test (FOBT) of the stool. You may have this test every year starting at age 74. Flexible sigmoidoscopy or colonoscopy. You may have a sigmoidoscopy every 5 years or a colonoscopy every 10 years starting at age 74. Prostate cancer screening. Recommendations will vary depending on your family history and other risks. Hepatitis C blood test. Hepatitis B blood test. Sexually transmitted disease (STD) testing. Diabetes screening. This is done by checking your blood sugar (glucose) after you have not  eaten for a while (fasting). You may have this done every 1-3 years. Abdominal aortic aneurysm (AAA) screening. You may need this if you are a current or former smoker. Osteoporosis. You may be screened starting at age 60 if you are at high risk. Talk with your health care provider about your test results,  treatment options, and if necessary, the need for more tests. Vaccines  Your health care provider may recommend certain vaccines, such as: Influenza vaccine. This is recommended every year. Tetanus, diphtheria, and acellular pertussis (Tdap, Td) vaccine. You may need a Td booster every 10 years. Zoster vaccine. You may need this after age 74. Pneumococcal 13-valent conjugate (PCV13) vaccine. One dose is recommended after age 74. Pneumococcal polysaccharide (PPSV23) vaccine. One dose is recommended after age 74. Talk to your health care provider about which screenings and vaccines you need and how often you need them. This information is not intended to replace advice given to you by your health care provider. Make sure you discuss any questions you have with your health care provider. Document Released: 07/02/2015 Document Revised: 02/23/2016 Document Reviewed: 04/06/2015 Elsevier Interactive Patient Education  2017 [employer] Prevention in the Home Falls can cause injuries. They can happen to people of all ages. There are many things you can do to make your home safe and to help prevent falls. What can I do on the outside of my home? Regularly fix the edges of walkways and driveways and fix any cracks. Remove anything that might make you trip as you walk through a door, such as a raised step or threshold. Trim any bushes or trees on the path to your home. Use bright outdoor lighting. Clear any walking paths of anything that might make someone trip, such as rocks or tools. Regularly check to see if handrails are loose or broken. Make sure that both sides of any steps have handrails. Any raised decks and porches should have guardrails on the edges. Have any leaves, snow, or ice cleared regularly. Use sand or salt on walking paths during winter. Clean up any spills in your garage right away. This includes oil or grease spills. What can I do in the bathroom? Use night  lights. Install grab bars by the toilet and in the tub and shower. Do not use towel bars as grab bars. Use non-skid mats or decals in the tub or shower. If you need to sit down in the shower, use a plastic, non-slip stool. Keep the floor dry. Clean up any water that spills on the floor as soon as it happens. Remove soap buildup in the tub or shower regularly. Attach bath mats securely with double-sided non-slip rug tape. Do not have throw rugs and other things on the floor that can make you trip. What can I do in the bedroom? Use night lights. Make sure that you have a light by your bed that is easy to reach. Do not use any sheets or blankets that are too big for your bed. They should not hang down onto the floor. Have a firm chair that has side arms. You can use this for support while you get dressed. Do not have throw rugs and other things on the floor that can make you trip. What can I do in the kitchen? Clean up any spills right away. Avoid walking on wet floors. Keep items that you use a lot in easy-to-reach places. If you need to reach something above you, use a strong step stool that  has a grab bar. Keep electrical cords out of the way. Do not use floor polish or wax that makes floors slippery. If you must use wax, use non-skid floor wax. Do not have throw rugs and other things on the floor that can make you trip. What can I do with my stairs? Do not leave any items on the stairs. Make sure that there are handrails on both sides of the stairs and use them. Fix handrails that are broken or loose. Make sure that handrails are as long as the stairways. Check any carpeting to make sure that it is firmly attached to the stairs. Fix any carpet that is loose or worn. Avoid having throw rugs at the top or bottom of the stairs. If you do have throw rugs, attach them to the floor with carpet tape. Make sure that you have a light switch at the top of the stairs and the bottom of the stairs. If  you do not have them, ask someone to add them for you. What else can I do to help prevent falls? Wear shoes that: Do not have high heels. Have rubber bottoms. Are comfortable and fit you well. Are closed at the toe. Do not wear sandals. If you use a stepladder: Make sure that it is fully opened. Do not climb a closed stepladder. Make sure that both sides of the stepladder are locked into place. Ask someone to hold it for you, if possible. Clearly mark and make sure that you can see: Any grab bars or handrails. First and last steps. Where the edge of each step is. Use tools that help you move around (mobility aids) if they are needed. These include: Canes. Walkers. Scooters. Crutches. Turn on the lights when you go into a dark area. Replace any light bulbs as soon as they burn out. Set up your furniture so you have a clear path. Avoid moving your furniture around. If any of your floors are uneven, fix them. If there are any pets around you, be aware of where they are. Review your medicines with your doctor. Some medicines can make you feel dizzy. This can increase your chance of falling. Ask your doctor what other things that you can do to help prevent falls. This information is not intended to replace advice given to you by your health care provider. Make sure you discuss any questions you have with your health care provider. Document Released: 04/01/2009 Document Revised: 11/11/2015 Document Reviewed: 07/10/2014 Elsevier Interactive Patient Education  2017 [employer].

## 2022-05-01 DIAGNOSIS — B9689 Other specified bacterial agents as the cause of diseases classified elsewhere: Secondary | ICD-10-CM | POA: Diagnosis not present

## 2022-05-01 DIAGNOSIS — L0202 Furuncle of face: Secondary | ICD-10-CM | POA: Diagnosis not present

## 2022-05-09 ENCOUNTER — Other Ambulatory Visit: Payer: Self-pay | Admitting: Internal Medicine

## 2022-05-09 DIAGNOSIS — I517 Cardiomegaly: Secondary | ICD-10-CM

## 2022-05-09 DIAGNOSIS — I10 Essential (primary) hypertension: Secondary | ICD-10-CM

## 2022-05-29 ENCOUNTER — Encounter: Payer: Self-pay | Admitting: Neurology

## 2022-05-29 ENCOUNTER — Ambulatory Visit (INDEPENDENT_AMBULATORY_CARE_PROVIDER_SITE_OTHER): Payer: Medicare Other | Admitting: Neurology

## 2022-05-29 VITALS — BP 140/85 | HR 52 | Ht 71.0 in | Wt 194.0 lb

## 2022-05-29 DIAGNOSIS — Z9189 Other specified personal risk factors, not elsewhere classified: Secondary | ICD-10-CM

## 2022-05-29 DIAGNOSIS — G4719 Other hypersomnia: Secondary | ICD-10-CM

## 2022-05-29 DIAGNOSIS — R0681 Apnea, not elsewhere classified: Secondary | ICD-10-CM

## 2022-05-29 DIAGNOSIS — R0683 Snoring: Secondary | ICD-10-CM | POA: Diagnosis not present

## 2022-05-29 DIAGNOSIS — E663 Overweight: Secondary | ICD-10-CM

## 2022-05-29 NOTE — Patient Instructions (Signed)

## 2022-05-29 NOTE — Progress Notes (Signed)
Subjective:    Patient ID: Russell [name] is a 76 y.o. male.  HPI    Star Age, MD, PhD [facility] [address] [address] [place], [place] [address]  Dear Dr. Ronnald Ramp,  I saw your patient, Russell [name], upon your kind request in my sleep clinic today for initial consultation of his sleep disorder, in particular, concern for underlying obstructive sleep apnea.  The patient is unaccompanied today.  As you know, Mr. Russell [name] is a 76 year old male with an underlying medical history of hypertension, hyperlipidemia, smoking, history of prostatitis, arthritis, melanoma, kidney stone, and mildly overweight state, who reports snoring and excessive daytime somnolence as well as witnessed apneas per wife's report.  His Epworth sleepiness score is 9 out of 24, fatigue severity score is 23 out of 63.  He does report some sleep disruption but generally feels like he sleeps well and feels reasonably well rested upon awakening.  Bedtime is generally between 930 and 9:45 PM and rise time at 5:20 AM, he usually goes to the gym in the mornings.  He has a TV in the bedroom but rarely has it on at night.  Sometimes he sleeps in a separate bedroom to avoid waking up his wife from his snoring, he does not wake up gasping for air.  He denies recurrent nocturnal or morning headaches, no night to night nocturia reported, he is not aware of any family history of sleep apnea. I reviewed your office note from 04/12/2022. He does drink caffeine daily typically in the form of 1 diet soda, sometimes a large cup of iced tea.  He drinks alcohol daily in the form of 1 beer, typically 1 glass of wine also and another glass of wine or alcoholic beverage later on prior to bedtime but not always.  He smokes no cigarettes, smokes about 3 to 4 cigars/month.  He works full-time as a Clinical cytogeneticist, he has his own company.  He has 2 grown children.  No pets in the household.  His Past Medical History Is  Significant For: Past Medical History:  Diagnosis Date   Acute prostatitis    Arthritis    hands   Cancer ([place])    basal cell removed, melanoma October 2022   Dislocated patella 04/2013   S/P   ARTHROPLASTY   Hemorrhoids    History of skin cancer    Hx of colonic polyps    Hypertension    Kidney stone    passed stone - no surgery required   Other and unspecified hyperlipidemia    Smoker    Whole blood donor     His Past Surgical History Is Significant For: Past Surgical History:  Procedure Laterality Date   COLONOSCOPY     EYE SURGERY     for scratced cornea   KNEE ARTHROPLASTY Left 04/30/2013   Procedure: COMPUTER ASSISTED TOTAL KNEE ARTHROPLASTY;  Surgeon: Marybelle Killings, MD;  Location: [facility];  Service: Orthopedics;  Laterality: Left;  Left Cemented Total Knee Arthroplasty   laser eye surgery for corneal scar right eye  2010   Dr[facility] Isaiah Blakes [facility]   left knee arthroscopy  03/2007   MOHS SURGERY     November 21,2022- lower back   PATELLAR TENDON REPAIR Left 05/14/2013   Procedure: Left Total Knee Arthroplasty Medial Retinaculum Repair;  Surgeon: Marybelle Killings, MD;  Location: [facility];  Service: Orthopedics;  Laterality: Left;  Left Total Knee Arthroplasty Medial Retinaculum Repair  POLYPECTOMY     skin cancer removed Left 2016   shoulder    skin cancer removed N/A 02/22/2016   from top of his head.  this was done at [facility]      His Family History Is Significant For: Family History  Problem Relation Age of Onset   Colon cancer Maternal Grandfather    Esophageal cancer Neg Hx    Rectal cancer Neg Hx    Stomach cancer Neg Hx    Sleep apnea Neg Hx     His Social History Is Significant For: Social History   Socioeconomic History   Marital status: Married    Spouse name: Not on file   Number of children: Not on file   Years of education: Not on file   Highest education level: Not on file  Occupational History   Occupation: real  Government social research officer: [employer]  Tobacco Use   Smoking status: Light Smoker    Types: Cigars   Smokeless tobacco: Never   Tobacco comments:    cigars once weekly  Vaping Use   Vaping Use: Never used  Substance and Sexual Activity   Alcohol use: Yes    Alcohol/week: 21.0 standard drinks of alcohol    Types: 14 Glasses of wine, 7 Cans of beer per week    Comment: daily use   Drug use: No   Sexual activity: Not on file  Other Topics Concern   Not on file  Social History Narrative   Not on file   Social Determinants of Health   Financial Resource Strain: Low Risk  (04/25/2022)   Overall Financial Resource Strain (CARDIA)    Difficulty of Paying Living Expenses: Not hard at all  Food Insecurity: No Food Insecurity (04/25/2022)   Hunger Vital Sign    Worried About Running Out of Food in the Last Year: Never true    Ran Out of Food in the Last Year: Never true  Transportation Needs: No Transportation Needs (04/25/2022)   PRAPARE - Hydrologist (Medical): No    Lack of Transportation (Non-Medical): No  Physical Activity: Sufficiently Active (04/25/2022)   Exercise Vital Sign    Days of Exercise per Week: 7 days    Minutes of Exercise per Session: 60 min  Stress: No Stress Concern Present (04/25/2022)   [place]    Feeling of Stress : Not at all  Social Connections: Moderately Integrated (04/25/2022)   Social Connection and Isolation Panel [NHANES]    Frequency of Communication with Friends and Family: More than three times a week    Frequency of Social Gatherings with Friends and Family: More than three times a week    Attends Religious Services: Never    Marine scientist or Organizations: Yes    Attends Music therapist: More than 4 times per year    Marital Status: Married    His Allergies Are:  No Known Allergies:   His Current Medications  Are:  Outpatient Encounter Medications as of 05/29/2022  Medication Sig   Ascorbic Acid (VITAMIN C PO) Take 500 mg by mouth daily.   aspirin EC 81 MG tablet Take 81 mg by mouth daily. Takes 3 times weekly   losartan (COZAAR) 100 MG tablet TAKE ONE TABLET BY MOUTH DAILY   simvastatin (ZOCOR) 40 MG tablet TAKE ONE TABLET EVERY EVENING   Probiotic Product (  PROBIOTIC ADVANCED) CAPS Take once daily while taking antibiotic.   No facility-administered encounter medications on file as of 05/29/2022.  :   Review of Systems:  Out of a complete 14 point review of systems, all are reviewed and negative with the exception of these symptoms as listed below:  Review of Systems  Neurological:        Pt here for sleep consult Pt snore,little fatigue ,hypertension. Pt denies headaches,sleep study,CPAP      ESS: FSS:    Objective:  Neurological Exam  Physical Exam Physical Examination:   Vitals:   05/29/22 0829  BP: (!) 140/85  Pulse: (!) 52    General Examination: The patient is a very pleasant 76 y.o. male in no acute distress. He appears well-developed and well-nourished and well groomed.   HEENT: Normocephalic, atraumatic, pupils are equal, round and reactive to light, extraocular tracking is good without limitation to gaze excursion or nystagmus noted. Hearing is grossly intact. Face is symmetric with normal facial animation. Speech is clear with no dysarthria noted. There is no hypophonia. There is no lip, neck/head, jaw or voice tremor. Neck is supple with full range of passive and active motion. There are no carotid bruits on auscultation. Oropharynx exam reveals: mild mouth dryness, adequate dental hygiene and moderate airway crowding, due to small airway entry, redundant soft palate and longer uvula.  Tonsils absent, Mallampati class II.  Neck circumference of 16-1/2 inches, mild overbite.  Tongue protrudes centrally and palate elevates symmetrically.  Chest: Clear to auscultation  without wheezing, rhonchi or crackles noted.  Heart: S1+S2+0, regular and normal without murmurs, rubs or gallops noted.   Abdomen: Soft, non-tender and non-distended.  Extremities: There is no obvious edema in the distal lower extremities bilaterally.   Skin: Warm and dry without trophic changes noted.   Musculoskeletal: exam reveals no obvious joint deformities.   Neurologically:  Mental status: The patient is awake, alert and oriented in all 4 spheres. His immediate and remote memory, attention, language skills and fund of knowledge are appropriate. There is no evidence of aphasia, agnosia, apraxia or anomia. Speech is clear with normal prosody and enunciation. Thought process is linear. Mood is normal and affect is normal.  Cranial nerves II - XII are as described above under HEENT exam.  Motor exam: Normal bulk, strength and tone is noted. There is no obvious action or resting tremor.  Fine motor skills and coordination: grossly intact.  Cerebellar testing: No dysmetria or intention tremor. There is no truncal or gait ataxia.  Sensory exam: intact to light touch in the upper and lower extremities.  Gait, station and balance: He stands easily. No veering to one side is noted. No leaning to one side is noted. Posture is age-appropriate and stance is narrow based. Gait shows normal stride length and normal pace. No problems turning are noted.   Assessment and Plan:  In summary, ANGELO PRINDLE is a very pleasant 76 y.o.-year old male with an underlying medical history of hypertension, hyperlipidemia, smoking, history of prostatitis, arthritis, melanoma, kidney stone, and mildly overweight state, whose history and physical exam are concerning for sleep disordered breathing, particularly obstructive sleep apnea.  A laboratory attended sleep study is typically considered "gold standard" for evaluation of sleep disordered breathing.   I had a long chat with the patient about my findings and the  diagnosis of sleep apnea, particularly OSA, its prognosis and treatment options. We talked about medical/conservative treatments, surgical interventions and non-pharmacological approaches for symptom  control. I explained, in particular, the risks and ramifications of untreated moderate to severe OSA, especially with respect to developing cardiovascular disease down the road, including congestive heart failure (CHF), difficult to treat hypertension, cardiac arrhythmias (particularly A-fib), neurovascular complications including TIA, stroke and dementia. Even type 2 diabetes has, in part, been linked to untreated OSA. Symptoms of untreated OSA may include (but may not be limited to) daytime sleepiness, nocturia (i.e. frequent nighttime urination), memory problems, mood irritability and suboptimally controlled or worsening mood disorder such as depression and/or anxiety, lack of energy, lack of motivation, physical discomfort, as well as recurrent headaches, especially morning or nocturnal headaches. We talked about the importance of maintaining a healthy lifestyle and striving for healthy weight. We talked about the importance of striving for and maintaining good sleep hygiene. I recommended a sleep study at this time. I outlined the differences between a laboratory attended sleep study which is considered more comprehensive and accurate over the option of a home sleep test (HST); the latter may lead to underestimation of sleep disordered breathing in some instances and does not help with diagnosing upper airway resistance syndrome and is not accurate enough to diagnose primary central sleep apnea typically. I outlined possible surgical and non-surgical treatment options of OSA, including the use of a positive airway pressure (PAP) device (i.e. CPAP, AutoPAP/APAP or BiPAP in certain circumstances), a custom-made dental device (aka oral appliance, which would require a referral to a specialist dentist or orthodontist  typically, and is generally speaking not considered for patients with full dentures or edentulous state), upper airway surgical options, such as traditional UPPP (which is not considered a first-line treatment) or the Inspire device (hypoglossal nerve stimulator, which would involve a referral for consultation with an ENT surgeon, after careful selection, following inclusion criteria - also not first-line treatment). I explained the PAP treatment option to the patient in detail, as this is generally considered first-line treatment.  The patient indicated that he would be reluctant but willing to try PAP therapy, if the need arises. I explained the importance of being compliant with PAP treatment, not only for insurance purposes but primarily to improve patient's symptoms symptoms, and for the patient's long term health benefit, including to reduce His cardiovascular risks longer-term.    We will pick up our discussion about the next steps and treatment options after testing.  We will keep him posted as to the test results by phone call and/or MyChart messaging where possible.  We will plan to follow-up in sleep clinic accordingly as well.  I answered all his questions today and the patient was in agreement.   I encouraged him to call with any interim questions, concerns, problems or updates or email [place] through [place].  Generally speaking, sleep test authorizations may take up to 2 weeks, sometimes less, sometimes longer, the patient is encouraged to get in touch with [place] if they do not hear back from the sleep lab staff directly within the next 2 weeks.  Thank you very much for allowing me to participate in the care of this nice patient. If I can be of any further assistance to you please do not hesitate to call me at [phone].  Sincerely,   Star Age, MD, PhD

## 2022-06-02 DIAGNOSIS — D225 Melanocytic nevi of trunk: Secondary | ICD-10-CM | POA: Diagnosis not present

## 2022-06-02 DIAGNOSIS — Z1283 Encounter for screening for malignant neoplasm of skin: Secondary | ICD-10-CM | POA: Diagnosis not present

## 2022-06-02 DIAGNOSIS — Z08 Encounter for follow-up examination after completed treatment for malignant neoplasm: Secondary | ICD-10-CM | POA: Diagnosis not present

## 2022-06-02 DIAGNOSIS — B078 Other viral warts: Secondary | ICD-10-CM | POA: Diagnosis not present

## 2022-06-02 DIAGNOSIS — Z85820 Personal history of malignant melanoma of skin: Secondary | ICD-10-CM | POA: Diagnosis not present

## 2022-06-28 ENCOUNTER — Telehealth: Payer: Self-pay

## 2022-06-28 NOTE — Telephone Encounter (Signed)
LVM for pt to call back to schedule sleep study.  

## 2022-07-03 ENCOUNTER — Other Ambulatory Visit: Payer: Self-pay | Admitting: Internal Medicine

## 2022-07-03 DIAGNOSIS — E785 Hyperlipidemia, unspecified: Secondary | ICD-10-CM

## 2022-07-04 NOTE — Telephone Encounter (Signed)
LVM for pt to call back to schedule sleep study.   We have attempted to call the patient two times to schedule sleep study.  Patient has been unavailable at the phone numbers we have on file and has not returned our calls.  If patient calls back we will schedule them for their sleep study.

## 2022-09-05 DIAGNOSIS — X32XXXD Exposure to sunlight, subsequent encounter: Secondary | ICD-10-CM | POA: Diagnosis not present

## 2022-09-05 DIAGNOSIS — Z85820 Personal history of malignant melanoma of skin: Secondary | ICD-10-CM | POA: Diagnosis not present

## 2022-09-05 DIAGNOSIS — Z1283 Encounter for screening for malignant neoplasm of skin: Secondary | ICD-10-CM | POA: Diagnosis not present

## 2022-09-05 DIAGNOSIS — D225 Melanocytic nevi of trunk: Secondary | ICD-10-CM | POA: Diagnosis not present

## 2022-09-05 DIAGNOSIS — Z08 Encounter for follow-up examination after completed treatment for malignant neoplasm: Secondary | ICD-10-CM | POA: Diagnosis not present

## 2022-09-05 DIAGNOSIS — B078 Other viral warts: Secondary | ICD-10-CM | POA: Diagnosis not present

## 2022-09-05 DIAGNOSIS — L570 Actinic keratosis: Secondary | ICD-10-CM | POA: Diagnosis not present

## 2022-10-02 ENCOUNTER — Other Ambulatory Visit: Payer: Self-pay | Admitting: Internal Medicine

## 2022-10-02 DIAGNOSIS — E785 Hyperlipidemia, unspecified: Secondary | ICD-10-CM

## 2022-11-10 ENCOUNTER — Other Ambulatory Visit: Payer: Self-pay | Admitting: Internal Medicine

## 2022-11-10 DIAGNOSIS — H35363 Drusen (degenerative) of macula, bilateral: Secondary | ICD-10-CM | POA: Diagnosis not present

## 2022-11-10 DIAGNOSIS — I517 Cardiomegaly: Secondary | ICD-10-CM

## 2022-11-10 DIAGNOSIS — H04123 Dry eye syndrome of bilateral lacrimal glands: Secondary | ICD-10-CM | POA: Diagnosis not present

## 2022-11-10 DIAGNOSIS — H40011 Open angle with borderline findings, low risk, right eye: Secondary | ICD-10-CM | POA: Diagnosis not present

## 2022-11-10 DIAGNOSIS — H25813 Combined forms of age-related cataract, bilateral: Secondary | ICD-10-CM | POA: Diagnosis not present

## 2022-11-10 DIAGNOSIS — I10 Essential (primary) hypertension: Secondary | ICD-10-CM

## 2022-11-11 ENCOUNTER — Encounter: Payer: Self-pay | Admitting: Internal Medicine

## 2022-11-20 ENCOUNTER — Encounter: Payer: Self-pay | Admitting: Internal Medicine

## 2022-11-20 ENCOUNTER — Ambulatory Visit (INDEPENDENT_AMBULATORY_CARE_PROVIDER_SITE_OTHER): Payer: Medicare Other | Admitting: Internal Medicine

## 2022-11-20 VITALS — BP 148/88 | HR 52 | Temp 97.6°F | Resp 16 | Ht 71.0 in | Wt 189.0 lb

## 2022-11-20 DIAGNOSIS — R001 Bradycardia, unspecified: Secondary | ICD-10-CM | POA: Diagnosis not present

## 2022-11-20 DIAGNOSIS — E785 Hyperlipidemia, unspecified: Secondary | ICD-10-CM | POA: Diagnosis not present

## 2022-11-20 DIAGNOSIS — R7303 Prediabetes: Secondary | ICD-10-CM

## 2022-11-20 DIAGNOSIS — R5382 Chronic fatigue, unspecified: Secondary | ICD-10-CM

## 2022-11-20 DIAGNOSIS — I10 Essential (primary) hypertension: Secondary | ICD-10-CM

## 2022-11-20 DIAGNOSIS — I517 Cardiomegaly: Secondary | ICD-10-CM | POA: Diagnosis not present

## 2022-11-20 LAB — BASIC METABOLIC PANEL
BUN: 13 mg/dL (ref 6–23)
CO2: 27 mEq/L (ref 19–32)
Calcium: 9.2 mg/dL (ref 8.4–10.5)
Chloride: 105 mEq/L (ref 96–112)
Creatinine, Ser: 0.9 mg/dL (ref 0.40–1.50)
GFR: 83.97 mL/min (ref >60.00)
Glucose, Bld: 98 mg/dL (ref 70–99)
Potassium: 4.6 mEq/L (ref 3.5–5.1)
Sodium: 140 mEq/L (ref 135–145)

## 2022-11-20 LAB — CBC WITH DIFFERENTIAL/PLATELET
Basophils Absolute: 0 10*3/uL (ref 0.0–0.1)
Basophils Relative: 0.4 % (ref 0.0–3.0)
Eosinophils Absolute: 0.2 10*3/uL (ref 0.0–0.7)
Eosinophils Relative: 2.8 % (ref 0.0–5.0)
HCT: 45.7 % (ref 39.0–52.0)
Hemoglobin: 15.2 g/dL (ref 13.0–17.0)
Lymphocytes Relative: 23 % (ref 12.0–46.0)
Lymphs Abs: 1.9 10*3/uL (ref 0.7–4.0)
MCHC: 33.3 g/dL (ref 30.0–36.0)
MCV: 99.3 fl (ref 78.0–100.0)
Monocytes Absolute: 0.5 10*3/uL (ref 0.1–1.0)
Monocytes Relative: 6 % (ref 3.0–12.0)
Neutro Abs: 5.5 10*3/uL (ref 1.4–7.7)
Neutrophils Relative %: 67.8 % (ref 43.0–77.0)
Platelets: 220 10*3/uL (ref 150.0–400.0)
RBC: 4.6 Mil/uL (ref 4.22–5.81)
RDW: 13.2 % (ref 11.5–15.5)
WBC: 8.1 10*3/uL (ref 4.0–10.5)

## 2022-11-20 LAB — TSH: TSH: 0.91 u[IU]/mL (ref 0.35–5.50)

## 2022-11-20 LAB — HEMOGLOBIN A1C: Hgb A1c MFr Bld: 5.8 % (ref 4.6–6.5)

## 2022-11-20 MED ORDER — SIMVASTATIN 40 MG PO TABS: 40.0000 mg | ORAL_TABLET | Freq: Every evening | ORAL | 1 refills | Status: DC

## 2022-11-20 MED ORDER — LOSARTAN POTASSIUM 100 MG PO TABS: 100.0000 mg | ORAL_TABLET | Freq: Every day | ORAL | 1 refills | Status: DC

## 2022-11-20 NOTE — Progress Notes (Signed)
Subjective:  Patient ID: Russell [name], male    DOB: [birth year]  Age: 77 y.o. MRN: [id number]  CC: Hypertension and Hyperlipidemia   HPI Russell [name] presents for f/up ----  He is very active, walks the golf course, and has good endurance.  He complains of chronic fatigue but denies dizziness, lightheadedness, chest pain, shortness of breath, diaphoresis, or edema.  Outpatient Medications Prior to Visit  Medication Sig Dispense Refill   Ascorbic Acid (VITAMIN C PO) Take 500 mg by mouth daily.     aspirin EC 81 MG tablet Take 81 mg by mouth daily. Takes 3 times weekly     Probiotic Product (PROBIOTIC ADVANCED) CAPS Take once daily while taking antibiotic. 14 capsule 0   losartan (COZAAR) 100 MG tablet TAKE ONE TABLET BY MOUTH DAILY 90 tablet 1   simvastatin (ZOCOR) 40 MG tablet TAKE ONE TABLET EVERY EVENING 90 tablet 0   No facility-administered medications prior to visit.    ROS Review of Systems  Constitutional:  Positive for fatigue. Negative for appetite change, chills, diaphoresis and unexpected weight change.  HENT: Negative.    Eyes: Negative.   Respiratory: Negative.  Negative for chest tightness, shortness of breath and wheezing.   Cardiovascular:  Negative for chest pain, palpitations and leg swelling.  Gastrointestinal:  Negative for abdominal pain, constipation, diarrhea and vomiting.  Genitourinary: Negative.  Negative for difficulty urinating.  Musculoskeletal:  Negative for arthralgias and myalgias.  Skin: Negative.   Neurological:  Negative for dizziness, weakness, light-headedness and headaches.  Hematological:  Negative for adenopathy. Does not bruise/bleed easily.  Psychiatric/Behavioral: Negative.      Objective:  BP (!) 148/88 (BP Location: Right Arm, Patient Position: Sitting, Cuff Size: Large)   Pulse (!) 52   Temp 97.6 F (36.4 C) (Oral)   Resp 16   Ht 5\' 11"  (1.803 m)   Wt 189 lb (85.7 kg)   SpO2 97%   BMI 26.36 kg/m   BP Readings from Last  3 Encounters:  11/20/22 (!) 148/88  05/29/22 (!) 140/85  04/12/22 138/86    Wt Readings from Last 3 Encounters:  11/20/22 189 lb (85.7 kg)  05/29/22 194 lb (88 kg)  04/12/22 189 lb (85.7 kg)    Physical Exam Vitals reviewed.  Constitutional:      Appearance: He is not ill-appearing.  HENT:     Nose: Nose normal.     Mouth/Throat:     Mouth: Mucous membranes are moist.  Eyes:     General: No scleral icterus.    Conjunctiva/sclera: Conjunctivae normal.  Cardiovascular:     Rate and Rhythm: Normal rate and regular rhythm.     Heart sounds: No murmur heard. Pulmonary:     Effort: Pulmonary effort is normal.     Breath sounds: No stridor. No wheezing, rhonchi or rales.  Abdominal:     General: Abdomen is flat.     Palpations: There is no mass.     Tenderness: There is no abdominal tenderness. There is no guarding.     Hernia: No hernia is present.  Musculoskeletal:        General: Normal range of motion.     Cervical back: Neck supple.     Right lower leg: No edema.     Left lower leg: No edema.  Lymphadenopathy:     Cervical: No cervical adenopathy.  Skin:    General: Skin is warm and dry.     Coloration: Skin is not pale.  Neurological:     General: No focal deficit present.     Mental Status: He is alert.     Coordination: Coordination normal.  Psychiatric:        Mood and Affect: Mood normal.        Behavior: Behavior normal.     Lab Results  Component Value Date   WBC 8.1 11/20/2022   HGB 15.2 11/20/2022   HCT 45.7 11/20/2022   PLT 220.0 11/20/2022   GLUCOSE 98 11/20/2022   CHOL 155 04/12/2022   TRIG 41.0 04/12/2022   HDL 65.50 04/12/2022   LDLDIRECT 153.3 05/31/2009   LDLCALC 82 04/12/2022   ALT 21 04/12/2022   AST 20 04/12/2022   NA 140 11/20/2022   K 4.6 11/20/2022   CL 105 11/20/2022   CREATININE 0.90 11/20/2022   BUN 13 11/20/2022   CO2 27 11/20/2022   TSH 0.91 11/20/2022   PSA 1.56 04/12/2022   INR 1.00 05/14/2013   HGBA1C 5.8  11/20/2022    No results found.  Assessment & Plan:   Bradycardia -     TSH; Future  Primary hypertension- His blood pressure is well-controlled. -     Losartan Potassium; Take 1 tablet (100 mg total) by mouth daily.  Dispense: 90 tablet; Refill: 1 -     Basic metabolic panel; Future -     CBC with Differential/Platelet; Future -     TSH; Future  Prediabetes -     Basic metabolic panel; Future -     Hemoglobin A1c; Future  Mild concentric left ventricular hypertrophy (LVH) -     Losartan Potassium; Take 1 tablet (100 mg total) by mouth daily.  Dispense: 90 tablet; Refill: 1 -     CT CARDIAC SCORING (SELF PAY ONLY); Future  Hyperlipidemia LDL goal <130- LDL goal achieved. Doing well on the statin.  Will evaluate for CAD. -     Simvastatin; Take 1 tablet (40 mg total) by mouth every evening.  Dispense: 90 tablet; Refill: 1 -     CT CARDIAC SCORING (SELF PAY ONLY); Future  Chronic fatigue -     CT CARDIAC SCORING (SELF PAY ONLY); Future  Hyperlipidemia LDL goal <70     Follow-up: Return in about 6 months (around 05/22/2023).  Sanda Linger, MD

## 2022-11-20 NOTE — Patient Instructions (Signed)
Bradycardia, Adult Bradycardia is a slower-than-normal heartbeat. A normal resting heart rate for an adult ranges from 60 to 100 beats per minute. With bradycardia, the resting heart rate is less than 60 beats per minute. Bradycardia can prevent enough oxygen from reaching certain areas of your body when you are active. It can be serious if it keeps enough oxygen from reaching your brain and other parts of your body. Bradycardia is not a problem for everyone. For some healthy adults, a slow resting heart rate is normal. What are the causes? This condition may be caused by: A problem with the heart, including: A problem with the heart's electrical system, such as a heart block. With a heart block, electrical signals between the chambers of the heart are partially or completely blocked, so they are not able to work as they should. A problem with the heart's natural pacemaker (sinus node). Heart disease. A heart attack. Heart damage. Lyme disease. A heart infection. A heart condition that is present at birth (congenital heart defect). Certain medicines that treat heart conditions. Certain conditions, such as hypothyroidism and obstructive sleep apnea. Problems with the balance of chemicals and other substances, like potassium, in the blood. Trauma. Radiation therapy. What increases the risk? You are more likely to develop this condition if you: Are age 65 or older. Have high blood pressure (hypertension), high cholesterol (hyperlipidemia), or diabetes. Drink heavily, use tobacco or nicotine products, or use drugs. What are the signs or symptoms? Symptoms of this condition include: Light-headedness. Feeling faint or fainting. Fatigue and weakness. Trouble with activity or exercise. Shortness of breath. Chest pain (angina). Drowsiness. Confusion. Dizziness. How is this diagnosed? This condition may be diagnosed based on: Your symptoms. Your medical history. A physical exam. During  the exam, your health care provider will listen to your heartbeat and check your pulse. To confirm the diagnosis, your health care provider may order tests, such as: Blood tests. An electrocardiogram (ECG). This test records the heart's electrical activity. The test can show how fast your heart is beating and whether the heartbeat is steady. A test in which you wear a portable device (event recorder or Holter monitor) to record your heart's electrical activity while you go about your day. An exercise test. How is this treated? Treatment for this condition depends on the cause of the condition and how severe your symptoms are. Treatment may involve: Treatment of the underlying condition. Changing your medicines or how much medicine you take. Having a small, battery-operated device called a pacemaker implanted under the skin. When bradycardia occurs, this device can be used to increase your heart rate and help your heart beat in a regular rhythm. Follow these instructions at home: Lifestyle Manage any health conditions that contribute to bradycardia as told by your health care provider. Follow a heart-healthy diet. A nutrition specialist (dietitian) can help educate you about healthy food options and changes. Follow an exercise program that is approved by your health care provider. Maintain a healthy weight. Try to reduce or manage your stress, such as with yoga or meditation. If you need help reducing stress, ask your health care provider. Do not use any products that contain nicotine or tobacco. These products include cigarettes, chewing tobacco, and vaping devices, such as e-cigarettes. If you need help quitting, ask your health care provider. Do not use illegal drugs. Alcohol use If you drink alcohol: Limit how much you have to: 0-1 drink a day for women who are not pregnant. 0-2 drinks a day   for men. Know how much alcohol is in a drink. In the [place] one drink equals one 12 oz bottle of  beer (355 mL), one 5 oz glass of wine (148 mL), or one 1 oz glass of hard liquor (44 mL). General instructions Take over-the-counter and prescription medicines only as told by your health care provider. Keep all follow-up visits. This is important. How is this prevented? In some cases, bradycardia may be prevented by: Treating underlying medical problems. Stopping behaviors or medicines that can trigger the condition. Contact a health care provider if: You feel light-headed or dizzy. You almost faint. You feel weak or are easily fatigued during physical activity. You experience confusion or have memory problems. Get help right away if: You faint. You have chest pains or an irregular heartbeat (palpitations). You have trouble breathing. These symptoms may represent a serious problem that is an emergency. Do not wait to see if the symptoms will go away. Get medical help right away. Call your local emergency services (911 in the [place] Do not drive yourself to the hospital. Summary Bradycardia is a slower-than-normal heartbeat. With bradycardia, the resting heart rate is less than 60 beats per minute. Treatment for this condition depends on the cause. Manage any health conditions that contribute to bradycardia as told by your health care provider. Do not use any products that contain nicotine or tobacco. These products include cigarettes, chewing tobacco, and vaping devices, such as e-cigarettes. Keep all follow-up visits. This is important. This information is not intended to replace advice given to you by your health care provider. Make sure you discuss any questions you have with your health care provider. Document Revised: 09/26/2020 Document Reviewed: 09/26/2020 Elsevier Patient Education  2024 [employer].  

## 2022-11-26 ENCOUNTER — Encounter: Payer: Self-pay | Admitting: Internal Medicine

## 2022-12-01 DIAGNOSIS — H25813 Combined forms of age-related cataract, bilateral: Secondary | ICD-10-CM | POA: Diagnosis not present

## 2022-12-01 DIAGNOSIS — H40011 Open angle with borderline findings, low risk, right eye: Secondary | ICD-10-CM | POA: Diagnosis not present

## 2022-12-01 DIAGNOSIS — H25011 Cortical age-related cataract, right eye: Secondary | ICD-10-CM | POA: Diagnosis not present

## 2022-12-05 DIAGNOSIS — L570 Actinic keratosis: Secondary | ICD-10-CM | POA: Diagnosis not present

## 2022-12-05 DIAGNOSIS — D225 Melanocytic nevi of trunk: Secondary | ICD-10-CM | POA: Diagnosis not present

## 2022-12-05 DIAGNOSIS — X32XXXD Exposure to sunlight, subsequent encounter: Secondary | ICD-10-CM | POA: Diagnosis not present

## 2022-12-05 DIAGNOSIS — Z85820 Personal history of malignant melanoma of skin: Secondary | ICD-10-CM | POA: Diagnosis not present

## 2022-12-05 DIAGNOSIS — Z1283 Encounter for screening for malignant neoplasm of skin: Secondary | ICD-10-CM | POA: Diagnosis not present

## 2022-12-05 DIAGNOSIS — L820 Inflamed seborrheic keratosis: Secondary | ICD-10-CM | POA: Diagnosis not present

## 2022-12-05 DIAGNOSIS — Z08 Encounter for follow-up examination after completed treatment for malignant neoplasm: Secondary | ICD-10-CM | POA: Diagnosis not present

## 2022-12-07 ENCOUNTER — Ambulatory Visit
Admission: RE | Admit: 2022-12-07 | Discharge: 2022-12-07 | Disposition: A | Discharge status: Home / Self Care | Payer: No Typology Code available for payment source | Source: Ambulatory Visit | Attending: Internal Medicine | Admitting: Internal Medicine

## 2022-12-07 ENCOUNTER — Encounter: Payer: Self-pay | Admitting: Internal Medicine

## 2022-12-07 DIAGNOSIS — R5382 Chronic fatigue, unspecified: Secondary | ICD-10-CM

## 2022-12-07 DIAGNOSIS — I517 Cardiomegaly: Secondary | ICD-10-CM

## 2022-12-07 DIAGNOSIS — E785 Hyperlipidemia, unspecified: Secondary | ICD-10-CM

## 2023-01-16 DIAGNOSIS — H25811 Combined forms of age-related cataract, right eye: Secondary | ICD-10-CM | POA: Diagnosis not present

## 2023-01-16 DIAGNOSIS — H268 Other specified cataract: Secondary | ICD-10-CM | POA: Diagnosis not present

## 2023-01-16 HISTORY — PX: CATARACT EXTRACTION: SUR2

## 2023-01-19 DIAGNOSIS — X32XXXD Exposure to sunlight, subsequent encounter: Secondary | ICD-10-CM | POA: Diagnosis not present

## 2023-01-19 DIAGNOSIS — Z85820 Personal history of malignant melanoma of skin: Secondary | ICD-10-CM | POA: Diagnosis not present

## 2023-01-19 DIAGNOSIS — L570 Actinic keratosis: Secondary | ICD-10-CM | POA: Diagnosis not present

## 2023-01-19 DIAGNOSIS — Z08 Encounter for follow-up examination after completed treatment for malignant neoplasm: Secondary | ICD-10-CM | POA: Diagnosis not present

## 2023-01-19 DIAGNOSIS — C44722 Squamous cell carcinoma of skin of right lower limb, including hip: Secondary | ICD-10-CM | POA: Diagnosis not present

## 2023-02-16 ENCOUNTER — Encounter: Payer: Self-pay | Admitting: Internal Medicine

## 2023-02-23 DIAGNOSIS — H00015 Hordeolum externum left lower eyelid: Secondary | ICD-10-CM | POA: Diagnosis not present

## 2023-02-23 DIAGNOSIS — H25812 Combined forms of age-related cataract, left eye: Secondary | ICD-10-CM | POA: Diagnosis not present

## 2023-02-28 ENCOUNTER — Ambulatory Visit: Payer: Medicare Other | Admitting: Internal Medicine

## 2023-02-28 ENCOUNTER — Encounter: Payer: Self-pay | Admitting: Internal Medicine

## 2023-02-28 VITALS — BP 146/92 | HR 68 | Temp 97.9°F | Resp 16 | Ht 71.0 in | Wt 192.0 lb

## 2023-02-28 DIAGNOSIS — R001 Bradycardia, unspecified: Secondary | ICD-10-CM | POA: Diagnosis not present

## 2023-02-28 DIAGNOSIS — I10 Essential (primary) hypertension: Secondary | ICD-10-CM

## 2023-02-28 DIAGNOSIS — I517 Cardiomegaly: Secondary | ICD-10-CM

## 2023-02-28 DIAGNOSIS — Z23 Encounter for immunization: Secondary | ICD-10-CM | POA: Diagnosis not present

## 2023-02-28 MED ORDER — INDAPAMIDE 1.25 MG PO TABS
1.2500 mg | ORAL_TABLET | Freq: Every day | ORAL | 1 refills | Status: DC
Start: 2023-02-28 — End: 2023-07-10

## 2023-02-28 NOTE — Progress Notes (Unsigned)
Subjective:  Patient ID: Russell [name], male    DOB: 1945/10/07  Age: 77 y.o. MRN: [id number]  CC: Hypertension   HPI Russell [name] presents for f/up ---  He is concernsedthat his BP is not well controlled with SBP excursions up to 140-150.  Outpatient Medications Prior to Visit  Medication Sig Dispense Refill   Ascorbic Acid (VITAMIN C PO) Take 500 mg by mouth daily.     aspirin EC 81 MG tablet Take 81 mg by mouth daily. Takes 3 times weekly     losartan (COZAAR) 100 MG tablet Take 1 tablet (100 mg total) by mouth daily. 90 tablet 1   simvastatin (ZOCOR) 40 MG tablet Take 1 tablet (40 mg total) by mouth every evening. 90 tablet 1   Probiotic Product (PROBIOTIC ADVANCED) CAPS Take once daily while taking antibiotic. 14 capsule 0   No facility-administered medications prior to visit.    ROS Review of Systems  Objective:  BP (!) 146/92 (BP Location: Right Arm, Patient Position: Sitting, Cuff Size: Large)   Pulse 68   Temp 97.9 F (36.6 C) (Oral)   Resp 16   Ht 5\' 11"  (1.803 m)   Wt 192 lb (87.1 kg)   SpO2 96%   BMI 26.78 kg/m   BP Readings from Last 3 Encounters:  02/28/23 (!) 146/92  11/20/22 (!) 148/88  05/29/22 (!) 140/85    Wt Readings from Last 3 Encounters:  02/28/23 192 lb (87.1 kg)  11/20/22 189 lb (85.7 kg)  05/29/22 194 lb (88 kg)    Physical Exam  Lab Results  Component Value Date   WBC 8.1 11/20/2022   HGB 15.2 11/20/2022   HCT 45.7 11/20/2022   PLT 220.0 11/20/2022   GLUCOSE 98 11/20/2022   CHOL 155 04/12/2022   TRIG 41.0 04/12/2022   HDL 65.50 04/12/2022   LDLDIRECT 153.3 05/31/2009   LDLCALC 82 04/12/2022   ALT 21 04/12/2022   AST 20 04/12/2022   NA 140 11/20/2022   K 4.6 11/20/2022   CL 105 11/20/2022   CREATININE 0.90 11/20/2022   BUN 13 11/20/2022   CO2 27 11/20/2022   TSH 0.91 11/20/2022   PSA 1.56 04/12/2022   INR 1.00 05/14/2013   HGBA1C 5.8 11/20/2022    CT CARDIAC SCORING ([facility] LOCATIONS ONLY)  Result Date:  12/07/2022 CLINICAL DATA:  77 year old Caucasian male with history of chest pain. * Tracking Code: FCC * EXAM: CT CARDIAC CORONARY ARTERY CALCIUM SCORE TECHNIQUE: Non-contrast imaging through the heart was performed using prospective ECG gating. Image post processing was performed on an independent workstation, allowing for quantitative analysis of the heart and coronary arteries. Note that this exam targets the heart and the chest was not imaged in its entirety. COMPARISON:  No priors. FINDINGS: CORONARY CALCIUM SCORES: Left Main: 0 LAD: 6 LCx: 0 RCA: 110 Total Agatston Score: 116 MESA database percentile: 38th AORTA MEASUREMENTS: Ascending Aorta: 4.4 cm Descending Aorta:3.1 cm OTHER FINDINGS: Atherosclerotic calcifications in the thoracic aorta. Ectasia of the ascending thoracic aorta. Small pulmonary nodule in the periphery of the right middle lobe (axial image 30 of series 9) measuring 5 mm. Within the visualized portions of the thorax there are no other larger more suspicious appearing pulmonary nodules or masses, there is no acute consolidative airspace disease, no pleural effusions, no pneumothorax and no lymphadenopathy. Some thin-walled cysts are incidentally noted in the right lung. Visualized portions of the upper abdomen are unremarkable. There are no aggressive appearing lytic or blastic  lesions noted in the visualized portions of the skeleton. IMPRESSION: 1. Patient's total coronary artery calcium score is 116 which is 38th percentile for patient's of matched age, gender and race/ethnicity. Please note that although the presence of coronary artery calcium documents the presence of coronary artery disease, the severity of this disease and any potential stenosis cannot be assessed on this noncontrast CT examination. Assessment for potential risk factor modification, dietary therapy or pharmacologic therapy may be warranted, if clinically indicated. 2.  Aortic Atherosclerosis (ICD10-I70.0). 3. 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 Recommend annual imaging followup by CTA or MRA. This recommendation follows 2010 ACCF/AHA/AATS/ACR/ASA/SCA/SCAI/SIR/STS/SVM Guidelines for the Diagnosis and Management of Patients with Thoracic Aortic Disease. Circulation. 2010; 121: X914-N829. Aortic aneurysm NOS (ICD10-I71.9). 4. 5 mm right middle lobe pulmonary nodule, nonspecific, but statistically likely benign. No follow-up needed if patient is low-risk.This recommendation follows the consensus statement: Guidelines for Management of Incidental Pulmonary Nodules Detected on CT Images: From the Fleischner Society 2017; Radiology 2017; 284:228-243. Electronically Signed   By: Trudie Reed M.D.   On: 12/07/2022 11:04    Assessment & Plan:  Primary hypertension -     Indapamide; Take 1 tablet (1.25 mg total) by mouth daily.  Dispense: 90 tablet; Refill: 1  Mild concentric left ventricular hypertrophy (LVH) -     Indapamide; Take 1 tablet (1.25 mg total) by mouth daily.  Dispense: 90 tablet; Refill: 1  Bradycardia  Flu vaccine need -     Flu Vaccine Trivalent High Dose (Fluad)     Follow-up: Return in about 4 months (around 06/30/2023).  Sanda Linger, MD

## 2023-02-28 NOTE — Patient Instructions (Signed)
Hypertension, Adult High blood pressure (hypertension) is when the force of blood pumping through the arteries is too strong. The arteries are the blood vessels that carry blood from the heart throughout the body. Hypertension forces the heart to work harder to pump blood and may cause arteries to become narrow or stiff. Untreated or uncontrolled hypertension can lead to a heart attack, heart failure, a stroke, kidney disease, and other problems. A blood pressure reading consists of a higher number over a lower number. Ideally, your blood pressure should be below 120/80. The first ("top") number is called the systolic pressure. It is a measure of the pressure in your arteries as your heart beats. The second ("bottom") number is called the diastolic pressure. It is a measure of the pressure in your arteries as the heart relaxes. What are the causes? The exact cause of this condition is not known. There are some conditions that result in high blood pressure. What increases the risk? Certain factors may make you more likely to develop high blood pressure. Some of these risk factors are under your control, including: Smoking. Not getting enough exercise or physical activity. Being overweight. Having too much fat, sugar, calories, or salt (sodium) in your diet. Drinking too much alcohol. Other risk factors include: Having a personal history of heart disease, diabetes, high cholesterol, or kidney disease. Stress. Having a family history of high blood pressure and high cholesterol. Having obstructive sleep apnea. Age. The risk increases with age. What are the signs or symptoms? High blood pressure may not cause symptoms. Very high blood pressure (hypertensive crisis) may cause: Headache. Fast or irregular heartbeats (palpitations). Shortness of breath. Nosebleed. Nausea and vomiting. Vision changes. Severe chest pain, dizziness, and seizures. How is this diagnosed? This condition is diagnosed by  measuring your blood pressure while you are seated, with your arm resting on a flat surface, your legs uncrossed, and your feet flat on the floor. The cuff of the blood pressure monitor will be placed directly against the skin of your upper arm at the level of your heart. Blood pressure should be measured at least twice using the same arm. Certain conditions can cause a difference in blood pressure between your right and left arms. If you have a high blood pressure reading during one visit or you have normal blood pressure with other risk factors, you may be asked to: Return on a different day to have your blood pressure checked again. Monitor your blood pressure at home for 1 week or longer. If you are diagnosed with hypertension, you may have other blood or imaging tests to help your health care provider understand your overall risk for other conditions. How is this treated? This condition is treated by making healthy lifestyle changes, such as eating healthy foods, exercising more, and reducing your alcohol intake. You may be referred for counseling on a healthy diet and physical activity. Your health care provider may prescribe medicine if lifestyle changes are not enough to get your blood pressure under control and if: Your systolic blood pressure is above 130. Your diastolic blood pressure is above 80. Your personal target blood pressure may vary depending on your medical conditions, your age, and other factors. Follow these instructions at home: Eating and drinking  Eat a diet that is high in fiber and potassium, and low in sodium, added sugar, and fat. An example of this eating plan is called the DASH diet. DASH stands for Dietary Approaches to Stop Hypertension. To eat this way: Eat   plenty of fresh fruits and vegetables. Try to fill one half of your plate at each meal with fruits and vegetables. Eat whole grains, such as whole-wheat pasta, brown rice, or whole-grain bread. Fill about one  fourth of your plate with whole grains. Eat or drink low-fat dairy products, such as skim milk or low-fat yogurt. Avoid fatty cuts of meat, processed or cured meats, and poultry with skin. Fill about one fourth of your plate with lean proteins, such as fish, chicken without skin, beans, eggs, or tofu. Avoid pre-made and processed foods. These tend to be higher in sodium, added sugar, and fat. Reduce your daily sodium intake. Many people with hypertension should eat less than 1,500 mg of sodium a day. Do not drink alcohol if: Your health care provider tells you not to drink. You are pregnant, may be pregnant, or are planning to become pregnant. If you drink alcohol: Limit how much you have to: 0-1 drink a day for women. 0-2 drinks a day for men. Know how much alcohol is in your drink. In the [place] one drink equals one 12 oz bottle of beer (355 mL), one 5 oz glass of wine (148 mL), or one 1 oz glass of hard liquor (44 mL). Lifestyle  Work with your health care provider to maintain a healthy body weight or to lose weight. Ask what an ideal weight is for you. Get at least 30 minutes of exercise that causes your heart to beat faster (aerobic exercise) most days of the week. Activities may include walking, swimming, or biking. Include exercise to strengthen your muscles (resistance exercise), such as Pilates or lifting weights, as part of your weekly exercise routine. Try to do these types of exercises for 30 minutes at least 3 days a week. Do not use any products that contain nicotine or tobacco. These products include cigarettes, chewing tobacco, and vaping devices, such as e-cigarettes. If you need help quitting, ask your health care provider. Monitor your blood pressure at home as told by your health care provider. Keep all follow-up visits. This is important. Medicines Take over-the-counter and prescription medicines only as told by your health care provider. Follow directions carefully. Blood  pressure medicines must be taken as prescribed. Do not skip doses of blood pressure medicine. Doing this puts you at risk for problems and can make the medicine less effective. Ask your health care provider about side effects or reactions to medicines that you should watch for. Contact a health care provider if you: Think you are having a reaction to a medicine you are taking. Have headaches that keep coming back (recurring). Feel dizzy. Have swelling in your ankles. Have trouble with your vision. Get help right away if you: Develop a severe headache or confusion. Have unusual weakness or numbness. Feel faint. Have severe pain in your chest or abdomen. Vomit repeatedly. Have trouble breathing. These symptoms may be an emergency. Get help right away. Call 911. Do not wait to see if the symptoms will go away. Do not drive yourself to the hospital. Summary Hypertension is when the force of blood pumping through your arteries is too strong. If this condition is not controlled, it may put you at risk for serious complications. Your personal target blood pressure may vary depending on your medical conditions, your age, and other factors. For most people, a normal blood pressure is less than 120/80. Hypertension is treated with lifestyle changes, medicines, or a combination of both. Lifestyle changes include losing weight, eating a healthy,   low-sodium diet, exercising more, and limiting alcohol. This information is not intended to replace advice given to you by your health care provider. Make sure you discuss any questions you have with your health care provider. Document Revised: 04/12/2021 Document Reviewed: 04/12/2021 Elsevier Patient Education  2024 [employer] Inc.  

## 2023-03-01 NOTE — Addendum Note (Signed)
Addended by: Etta Grandchild on: 03/01/2023 04:58 PM   Modules accepted: Level of Service

## 2023-03-02 DIAGNOSIS — Z08 Encounter for follow-up examination after completed treatment for malignant neoplasm: Secondary | ICD-10-CM | POA: Diagnosis not present

## 2023-03-02 DIAGNOSIS — Z1283 Encounter for screening for malignant neoplasm of skin: Secondary | ICD-10-CM | POA: Diagnosis not present

## 2023-03-02 DIAGNOSIS — Z85828 Personal history of other malignant neoplasm of skin: Secondary | ICD-10-CM | POA: Diagnosis not present

## 2023-03-02 DIAGNOSIS — Z85820 Personal history of malignant melanoma of skin: Secondary | ICD-10-CM | POA: Diagnosis not present

## 2023-03-02 DIAGNOSIS — X32XXXD Exposure to sunlight, subsequent encounter: Secondary | ICD-10-CM | POA: Diagnosis not present

## 2023-03-02 DIAGNOSIS — D225 Melanocytic nevi of trunk: Secondary | ICD-10-CM | POA: Diagnosis not present

## 2023-03-02 DIAGNOSIS — L570 Actinic keratosis: Secondary | ICD-10-CM | POA: Diagnosis not present

## 2023-03-08 DIAGNOSIS — H00015 Hordeolum externum left lower eyelid: Secondary | ICD-10-CM | POA: Diagnosis not present

## 2023-03-29 DIAGNOSIS — H00015 Hordeolum externum left lower eyelid: Secondary | ICD-10-CM | POA: Diagnosis not present

## 2023-04-02 DIAGNOSIS — C4362 Malignant melanoma of left upper limb, including shoulder: Secondary | ICD-10-CM | POA: Diagnosis not present

## 2023-05-04 ENCOUNTER — Ambulatory Visit (INDEPENDENT_AMBULATORY_CARE_PROVIDER_SITE_OTHER): Payer: Medicare Other

## 2023-05-04 DIAGNOSIS — Z0000 Encounter for general adult medical examination without abnormal findings: Secondary | ICD-10-CM | POA: Diagnosis not present

## 2023-05-04 NOTE — Progress Notes (Signed)
Subjective:   Russell [name] is a 77 y.o. male who presents for Medicare Annual/Subsequent preventive examination.  Visit Complete: Virtual I connected with  [name] on 05/04/23 by a audio enabled telemedicine application and verified that I am speaking with the correct person using two identifiers.  Patient Location: Home  Provider Location: Office/Clinic  I discussed the limitations of evaluation and management by telemedicine. The patient expressed understanding and agreed to proceed.  Vital Signs: Because this visit was a virtual/telehealth visit, some criteria may be missing or patient reported. Any vitals not documented were not able to be obtained and vitals that have been documented are patient reported.  Patient Medicare AWV questionnaire was completed by the patient on 05/03/2023; I have confirmed that all information answered by patient is correct and no changes since this date.  Cardiac Risk Factors include: advanced age (>50men, >77 women);dyslipidemia;hypertension;male gender     Objective:    Today's Vitals   There is no height or weight on file to calculate BMI.     05/04/2023    8:51 AM 04/25/2022    3:05 PM 01/18/2016    8:17 AM 05/14/2013   12:26 PM 05/01/2013    7:40 PM 04/18/2013    8:28 AM  Advanced Directives  Does Patient Have a Medical Advance Directive? Yes Yes Yes Patient has advance directive, copy not in chart Patient has advance directive, copy not in chart Patient has advance directive, copy not in chart  Type of Advance Directive Healthcare Power of [place];Living will Healthcare Power of [place];Living will Healthcare Power of [employer] of [place];Living will Healthcare Power of [place];Living will  Does patient want to make changes to medical advance directive?    No No   Copy of Healthcare Power of Attorney in Chart?  No - copy requested  Copy requested from family Copy requested from family   Pre-existing out of  facility DNR order (yellow form or pink MOST form)     No No    Current Medications (verified) Outpatient Encounter Medications as of 05/04/2023  Medication Sig   Ascorbic Acid (VITAMIN C PO) Take 500 mg by mouth daily.   aspirin EC 81 MG tablet Take 81 mg by mouth daily. Takes 3 times weekly   indapamide (LOZOL) 1.25 MG tablet Take 1 tablet (1.25 mg total) by mouth daily.   losartan (COZAAR) 100 MG tablet Take 1 tablet (100 mg total) by mouth daily.   simvastatin (ZOCOR) 40 MG tablet Take 1 tablet (40 mg total) by mouth every evening.   No facility-administered encounter medications on file as of 05/04/2023.    Allergies (verified) Patient has no known allergies.   History: Past Medical History:  Diagnosis Date   Acute prostatitis    Arthritis    hands   Cancer (HCC)    basal cell removed, melanoma October 2022   Dislocated patella 04/2013   S/P   ARTHROPLASTY   Hemorrhoids    History of skin cancer    Hx of colonic polyps    Hypertension    Kidney stone    passed stone - no surgery required   Other and unspecified hyperlipidemia    Smoker    Whole blood donor    Past Surgical History:  Procedure Laterality Date   CATARACT EXTRACTION Right 01/16/2023   COLONOSCOPY     EYE SURGERY     for scratced cornea   KNEE ARTHROPLASTY Left 04/30/2013   Procedure: COMPUTER ASSISTED  TOTAL KNEE ARTHROPLASTY;  Surgeon: Eldred Manges, MD;  Location: [facility] OR;  Service: Orthopedics;  Laterality: Left;  Left Cemented Total Knee Arthroplasty   laser eye surgery for corneal scar right eye  2010   Dr. Lisette Grinder at [facility]   left knee arthroscopy  03/2007   MOHS SURGERY     November 21,2022- lower back   PATELLAR TENDON REPAIR Left 05/14/2013   Procedure: Left Total Knee Arthroplasty Medial Retinaculum Repair;  Surgeon: Eldred Manges, MD;  Location: [facility] OR;  Service: Orthopedics;  Laterality: Left;  Left Total Knee Arthroplasty Medial Retinaculum Repair   POLYPECTOMY     skin cancer removed Left  2016   shoulder    skin cancer removed N/A 02/22/2016   from top of his head.  this was done at [facility]   TONSILLECTOMY AND ADENOIDECTOMY     Family History  Problem Relation Age of Onset   Colon cancer Maternal Grandfather    Esophageal cancer Neg Hx    Rectal cancer Neg Hx    Stomach cancer Neg Hx    Sleep apnea Neg Hx    Social History   Socioeconomic History   Marital status: Married    Spouse name: Not on file   Number of children: Not on file   Years of education: Not on file   Highest education level: Not on file  Occupational History   Occupation: real Psychologist, occupational    Employer: [employer]  Tobacco Use   Smoking status: Light Smoker    Types: Cigars   Smokeless tobacco: Never   Tobacco comments:    cigars once weekly  Vaping Use   Vaping status: Never Used  Substance and Sexual Activity   Alcohol use: Yes    Alcohol/week: 21.0 standard drinks of alcohol    Types: 14 Glasses of wine, 7 Cans of beer per week    Comment: daily use   Drug use: No   Sexual activity: Not on file  Other Topics Concern   Not on file  Social History Narrative   Not on file   Social Determinants of Health   Financial Resource Strain: Low Risk  (05/03/2023)   Overall Financial Resource Strain (CARDIA)    Difficulty of Paying Living Expenses: Not hard at all  Food Insecurity: No Food Insecurity (05/03/2023)   Hunger Vital Sign    Worried About Running Out of Food in the Last Year: Never true    Ran Out of Food in the Last Year: Never true  Transportation Needs: No Transportation Needs (05/03/2023)   PRAPARE - Administrator, Civil Service (Medical): No    Lack of Transportation (Non-Medical): No  Physical Activity: Insufficiently Active (05/03/2023)   Exercise Vital Sign    Days of Exercise per Week: 4 days    Minutes of Exercise per Session: 30 min  Stress: Stress Concern Present (05/03/2023)   [employer] of Occupational Health - Occupational  Stress Questionnaire    Feeling of Stress : To some extent  Social Connections: Moderately Integrated (05/03/2023)   Social Connection and Isolation Panel [NHANES]    Frequency of Communication with Friends and Family: Twice a week    Frequency of Social Gatherings with Friends and Family: More than three times a week    Attends Religious Services: Never    Database administrator or Organizations: Yes    Attends Engineer, structural: More than 4 times per year    Marital  Status: Married    Tobacco Counseling Ready to quit: Not Answered Counseling given: Not Answered Tobacco comments: cigars once weekly   Clinical Intake:  Pre-visit preparation completed: Yes  Pain : No/denies pain     Nutritional Risks: None Diabetes: No  How often do you need to have someone help you when you read instructions, pamphlets, or other written materials from your doctor or pharmacy?: 1 - Never  Interpreter Needed?: No  Information entered by :: NAllen LPN   Activities of Daily Living    05/03/2023    3:15 PM  In your present state of health, do you have any difficulty performing the following activities:  Hearing? 0  Vision? 1  Comment left eye cataract  Difficulty concentrating or making decisions? 0  Walking or climbing stairs? 0  Dressing or bathing? 0  Doing errands, shopping? 0  Preparing Food and eating ? N  Using the Toilet? N  In the past six months, have you accidently leaked urine? N  Do you have problems with loss of bowel control? N  Managing your Medications? N  Managing your Finances? N  Housekeeping or managing your Housekeeping? N    Patient Care Team: Etta Grandchild, MD as PCP - General (Internal Medicine) Earnstine Regal, OD as Consulting Physician (Optometry)  Indicate any recent Medical Services you may have received from other than [facility] providers in the past year (date may be approximate).     Assessment:   This is a routine wellness  examination for Faustino.  Hearing/Vision screen Hearing Screening - Comments:: Denies hearing issues Vision Screening - Comments:: Regular eye exams, Dr. Benjamine Mola, [facility]   Goals Addressed             This Visit's Progress    Patient Stated       05/04/2023, wants to lose weight       Depression Screen    05/04/2023    8:52 AM 04/25/2022    3:18 PM 04/12/2022    9:16 AM 04/11/2021    9:48 AM 12/08/2019    8:27 AM 06/24/2018    3:31 PM 03/23/2016    9:14 AM  PHQ 2/9 Scores  PHQ - 2 Score 0 0 0 0 0 0 0  PHQ- 9 Score 1 0 0        Fall Risk    05/03/2023    3:15 PM 04/25/2022    3:07 PM 04/12/2022    9:15 AM 04/11/2021    9:48 AM 12/08/2019    8:17 AM  Fall Risk   Falls in the past year? 0 0 0 0 0  Number falls in past yr: 0 0 0  0  Injury with Fall? 0 0 0  0  Risk for fall due to : Medication side effect No Fall Risks No Fall Risks  No Fall Risks  Follow up Falls prevention discussed;Falls evaluation completed Falls prevention discussed Falls evaluation completed  Falls evaluation completed    MEDICARE RISK AT HOME: Medicare Risk at Home Any stairs in or around the home?: No If so, are there any without handrails?: No Home free of loose throw rugs in walkways, pet beds, electrical cords, etc?: Yes Adequate lighting in your home to reduce risk of falls?: Yes Life alert?: No Use of a cane, walker or w/c?: No Grab bars in the bathroom?: No Shower chair or bench in shower?: No Elevated toilet seat or a handicapped toilet?: No  TIMED UP AND GO:  Was the test performed?  No    Cognitive Function:        05/04/2023    8:53 AM 04/25/2022    3:19 PM  6CIT Screen  What Year? 0 points 0 points  What month? 0 points 0 points  What time? 0 points 0 points  Count back from 20 0 points 0 points  Months in reverse 0 points 0 points  Repeat phrase 0 points 0 points  Total Score 0 points 0 points    Immunizations Immunization History  Administered Date(s)  Administered   Fluad Quad(high Dose 65+) 03/31/2019, 04/01/2020, 04/11/2021, 04/12/2022   Fluad Trivalent(High Dose 65+) 02/28/2023   Influenza Whole 02/25/2010, 02/18/2012   Influenza, High Dose Seasonal PF 04/01/2014, 03/23/2016, 03/19/2017, 03/26/2018   Influenza,inj,Quad PF,6+ Mos 03/24/2015   Influenza-Unspecified 02/17/2013   PFIZER(Purple Top)SARS-COV-2 Vaccination 07/25/2019, 08/15/2019, 04/03/2020, 10/15/2020, 04/28/2021   Pneumococcal Conjugate-13 05/20/2018   Pneumococcal Polysaccharide-23 03/19/2013, 12/08/2019   Tdap 06/19/2013    TDAP status: Up to date  Flu Vaccine status: Up to date  Pneumococcal vaccine status: Up to date  Covid-19 vaccine status: Information provided on how to obtain vaccines.   Qualifies for Shingles Vaccine? Yes   Zostavax completed No   Shingrix Completed?: No.    Education has been provided regarding the importance of this vaccine. Patient has been advised to call insurance company to determine out of pocket expense if they have not yet received this vaccine. Advised may also receive vaccine at local pharmacy or Health Dept. Verbalized acceptance and understanding.  Screening Tests Health Maintenance  Topic Date Due   Zoster Vaccines- Shingrix (1 of 2) Never done   COVID-19 Vaccine (6 - 2023-24 season) 05/20/2023 (Originally 02/18/2023)   DTaP/Tdap/Td (2 - Td or Tdap) 06/20/2023   Medicare Annual Wellness (AWV)  05/03/2024   Colonoscopy  06/28/2026   Pneumonia Vaccine 66+ Years old  Completed   INFLUENZA VACCINE  Completed   Hepatitis C Screening  Completed   HPV VACCINES  Aged Out    Health Maintenance  Health Maintenance Due  Topic Date Due   Zoster Vaccines- Shingrix (1 of 2) Never done    Colorectal cancer screening: No longer required.   Lung Cancer Screening: (Low Dose CT Chest recommended if Age 43-80 years, 20 pack-year currently smoking OR have quit w/in 15years.) does not qualify.   Lung Cancer Screening Referral:  no  Additional Screening:  Hepatitis C Screening: does qualify; Completed 06/24/2018  Vision Screening: Recommended annual ophthalmology exams for early detection of glaucoma and other disorders of the eye. Is the patient up to date with their annual eye exam?  Yes  Who is the provider or what is the name of the office in which the patient attends annual eye exams? [facility] If pt is not established with a provider, would they like to be referred to a provider to establish care? No .   Dental Screening: Recommended annual dental exams for proper oral hygiene  Diabetic Foot Exam: n/a  Community Resource Referral / Chronic Care Management: CRR required this visit?  No   CCM required this visit?  No     Plan:     I have personally reviewed and noted the following in the patient's chart:   Medical and social history Use of alcohol, tobacco or illicit drugs  Current medications and supplements including opioid prescriptions. Patient is not currently taking opioid prescriptions. Functional ability and status Nutritional status Physical activity Advanced directives List of  other physicians Hospitalizations, surgeries, and ER visits in previous 12 months Vitals Screenings to include cognitive, depression, and falls Referrals and appointments  In addition, I have reviewed and discussed with patient certain preventive protocols, quality metrics, and best practice recommendations. A written personalized care plan for preventive services as well as general preventive health recommendations were provided to patient.     Barb Merino, LPN   [id number]   After Visit Summary: (MyChart) Due to this being a telephonic visit, the after visit summary with patients personalized plan was offered to patient via MyChart   Nurse Notes: none

## 2023-05-04 NOTE — Patient Instructions (Signed)
Russell [name] , Thank you for taking time to come for your Medicare Wellness Visit. I appreciate your ongoing commitment to your health goals. Please review the following plan we discussed and let me know if I can assist you in the future.   Referrals/Orders/Follow-Ups/Clinician Recommendations: none  This is a list of the screening recommended for you and due dates:  Health Maintenance  Topic Date Due   Zoster (Shingles) Vaccine (1 of 2) Never done   COVID-19 Vaccine (6 - 2023-24 season) 05/20/2023*   DTaP/Tdap/Td vaccine (2 - Td or Tdap) 06/20/2023   Medicare Annual Wellness Visit  05/03/2024   Colon Cancer Screening  06/28/2026   Pneumonia Vaccine  Completed   Flu Shot  Completed   Hepatitis C Screening  Completed   HPV Vaccine  Aged Out  *Topic was postponed. The date shown is not the original due date.    Advanced directives: (Copy Requested) Please bring a copy of your health care power of attorney and living will to the office to be added to your chart at your convenience.  Next Medicare Annual Wellness Visit scheduled for next year: Yes  insert Preventive Care attachment Insert FALL PREVENTION attachment if needed

## 2023-05-08 ENCOUNTER — Other Ambulatory Visit: Payer: Self-pay | Admitting: Internal Medicine

## 2023-05-08 DIAGNOSIS — I10 Essential (primary) hypertension: Secondary | ICD-10-CM

## 2023-05-08 DIAGNOSIS — I517 Cardiomegaly: Secondary | ICD-10-CM

## 2023-05-14 DIAGNOSIS — H00015 Hordeolum externum left lower eyelid: Secondary | ICD-10-CM | POA: Diagnosis not present

## 2023-05-22 ENCOUNTER — Ambulatory Visit: Payer: BLUE CROSS/BLUE SHIELD | Admitting: Internal Medicine

## 2023-05-26 DIAGNOSIS — H02885 Meibomian gland dysfunction left lower eyelid: Secondary | ICD-10-CM | POA: Diagnosis not present

## 2023-05-26 DIAGNOSIS — H00015 Hordeolum externum left lower eyelid: Secondary | ICD-10-CM | POA: Diagnosis not present

## 2023-05-26 DIAGNOSIS — H40043 Steroid responder, bilateral: Secondary | ICD-10-CM | POA: Diagnosis not present

## 2023-06-01 DIAGNOSIS — H268 Other specified cataract: Secondary | ICD-10-CM | POA: Diagnosis not present

## 2023-06-01 DIAGNOSIS — H25812 Combined forms of age-related cataract, left eye: Secondary | ICD-10-CM | POA: Diagnosis not present

## 2023-06-01 DIAGNOSIS — H2512 Age-related nuclear cataract, left eye: Secondary | ICD-10-CM | POA: Diagnosis not present

## 2023-06-23 ENCOUNTER — Other Ambulatory Visit: Payer: Self-pay | Admitting: Internal Medicine

## 2023-06-23 DIAGNOSIS — E785 Hyperlipidemia, unspecified: Secondary | ICD-10-CM

## 2023-07-02 ENCOUNTER — Ambulatory Visit: Payer: BLUE CROSS/BLUE SHIELD | Admitting: Internal Medicine

## 2023-07-10 ENCOUNTER — Ambulatory Visit: Payer: Medicare Other | Admitting: Internal Medicine

## 2023-07-10 ENCOUNTER — Encounter: Payer: Self-pay | Admitting: Internal Medicine

## 2023-07-10 VITALS — BP 116/86 | HR 53 | Temp 97.6°F | Ht 71.0 in | Wt 191.6 lb

## 2023-07-10 DIAGNOSIS — I10 Essential (primary) hypertension: Secondary | ICD-10-CM

## 2023-07-10 DIAGNOSIS — R001 Bradycardia, unspecified: Secondary | ICD-10-CM

## 2023-07-10 DIAGNOSIS — E785 Hyperlipidemia, unspecified: Secondary | ICD-10-CM | POA: Diagnosis not present

## 2023-07-10 LAB — CBC WITH DIFFERENTIAL/PLATELET
Basophils Absolute: 0.1 10*3/uL (ref 0.0–0.1)
Basophils Relative: 0.6 % (ref 0.0–3.0)
Eosinophils Absolute: 0.6 10*3/uL (ref 0.0–0.7)
Eosinophils Relative: 6.6 % — ABNORMAL HIGH (ref 0.0–5.0)
HCT: 46.8 % (ref 39.0–52.0)
Hemoglobin: 15.8 g/dL (ref 13.0–17.0)
Lymphocytes Relative: 25.7 % (ref 12.0–46.0)
Lymphs Abs: 2.2 10*3/uL (ref 0.7–4.0)
MCHC: 33.7 g/dL (ref 30.0–36.0)
MCV: 100 fL (ref 78.0–100.0)
Monocytes Absolute: 0.6 10*3/uL (ref 0.1–1.0)
Monocytes Relative: 6.7 % (ref 3.0–12.0)
Neutro Abs: 5.2 10*3/uL (ref 1.4–7.7)
Neutrophils Relative %: 60.4 % (ref 43.0–77.0)
Platelets: 228 10*3/uL (ref 150.0–400.0)
RBC: 4.68 Mil/uL (ref 4.22–5.81)
RDW: 13 % (ref 11.5–15.5)
WBC: 8.5 10*3/uL (ref 4.0–10.5)

## 2023-07-10 LAB — BASIC METABOLIC PANEL
BUN: 17 mg/dL (ref 6–23)
CO2: 26 meq/L (ref 19–32)
Calcium: 9.5 mg/dL (ref 8.4–10.5)
Chloride: 102 meq/L (ref 96–112)
Creatinine, Ser: 0.87 mg/dL (ref 0.40–1.50)
GFR: 84.46 mL/min (ref >60.00)
Glucose, Bld: 112 mg/dL — ABNORMAL HIGH (ref 70–99)
Potassium: 4.3 meq/L (ref 3.5–5.1)
Sodium: 137 meq/L (ref 135–145)

## 2023-07-10 LAB — LIPID PANEL
Cholesterol: 169 mg/dL (ref 0–200)
HDL: 73 mg/dL (ref >39.00)
LDL Cholesterol: 84 mg/dL (ref 0–99)
NonHDL: 95.92
Total CHOL/HDL Ratio: 2
Triglycerides: 60 mg/dL (ref 0.0–149.0)
VLDL: 12 mg/dL (ref 0.0–40.0)

## 2023-07-10 LAB — CK: Total CK: 188 U/L (ref 7–232)

## 2023-07-10 NOTE — Patient Instructions (Signed)
Bradycardia, Adult Bradycardia is a slower-than-normal heartbeat. A normal resting heart rate for an adult ranges from 60 to 100 beats per minute. With bradycardia, the resting heart rate is less than 60 beats per minute. Bradycardia can prevent enough oxygen from reaching certain areas of your body when you are active. It can be serious if it keeps enough oxygen from reaching your brain and other parts of your body. Bradycardia is not a problem for everyone. For some healthy adults, a slow resting heart rate is normal. What are the causes? This condition may be caused by: A problem with the heart, including: A problem with the heart's electrical system, such as a heart block. With a heart block, electrical signals between the chambers of the heart are partially or completely blocked, so they are not able to work as they should. A problem with the heart's natural pacemaker (sinus node). Heart disease. A heart attack. Heart damage. Lyme disease. A heart infection. A heart condition that is present at birth (congenital heart defect). Certain medicines that treat heart conditions. Certain conditions, such as hypothyroidism and obstructive sleep apnea. Problems with the balance of chemicals and other substances, like potassium, in the blood. Trauma. Radiation therapy. What increases the risk? You are more likely to develop this condition if you: Are age 65 or older. Have high blood pressure (hypertension), high cholesterol (hyperlipidemia), or diabetes. Drink heavily, use tobacco or nicotine products, or use drugs. What are the signs or symptoms? Symptoms of this condition include: Light-headedness. Feeling faint or fainting. Fatigue and weakness. Trouble with activity or exercise. Shortness of breath. Chest pain (angina). Drowsiness. Confusion. Dizziness. How is this diagnosed? This condition may be diagnosed based on: Your symptoms. Your medical history. A physical exam. During  the exam, your health care provider will listen to your heartbeat and check your pulse. To confirm the diagnosis, your health care provider may order tests, such as: Blood tests. An electrocardiogram (ECG). This test records the heart's electrical activity. The test can show how fast your heart is beating and whether the heartbeat is steady. A test in which you wear a portable device (event recorder or Holter monitor) to record your heart's electrical activity while you go about your day. An exercise test. How is this treated? Treatment for this condition depends on the cause of the condition and how severe your symptoms are. Treatment may involve: Treatment of the underlying condition. Changing your medicines or how much medicine you take. Having a small, battery-operated device called a pacemaker implanted under the skin. When bradycardia occurs, this device can be used to increase your heart rate and help your heart beat in a regular rhythm. Follow these instructions at home: Lifestyle Manage any health conditions that contribute to bradycardia as told by your health care provider. Follow a heart-healthy diet. A nutrition specialist (dietitian) can help educate you about healthy food options and changes. Follow an exercise program that is approved by your health care provider. Maintain a healthy weight. Try to reduce or manage your stress, such as with yoga or meditation. If you need help reducing stress, ask your health care provider. Do not use any products that contain nicotine or tobacco. These products include cigarettes, chewing tobacco, and vaping devices, such as e-cigarettes. If you need help quitting, ask your health care provider. Do not use illegal drugs. Alcohol use If you drink alcohol: Limit how much you have to: 0-1 drink a day for women who are not pregnant. 0-2 drinks a day   for men. Know how much alcohol is in a drink. In the [place] one drink equals one 12 oz bottle of  beer (355 mL), one 5 oz glass of wine (148 mL), or one 1 oz glass of hard liquor (44 mL). General instructions Take over-the-counter and prescription medicines only as told by your health care provider. Keep all follow-up visits. This is important. How is this prevented? In some cases, bradycardia may be prevented by: Treating underlying medical problems. Stopping behaviors or medicines that can trigger the condition. Contact a health care provider if: You feel light-headed or dizzy. You almost faint. You feel weak or are easily fatigued during physical activity. You experience confusion or have memory problems. Get help right away if: You faint. You have chest pains or an irregular heartbeat (palpitations). You have trouble breathing. These symptoms may represent a serious problem that is an emergency. Do not wait to see if the symptoms will go away. Get medical help right away. Call your local emergency services (911 in the [place] Do not drive yourself to the hospital. Summary Bradycardia is a slower-than-normal heartbeat. With bradycardia, the resting heart rate is less than 60 beats per minute. Treatment for this condition depends on the cause. Manage any health conditions that contribute to bradycardia as told by your health care provider. Do not use any products that contain nicotine or tobacco. These products include cigarettes, chewing tobacco, and vaping devices, such as e-cigarettes. Keep all follow-up visits. This is important. This information is not intended to replace advice given to you by your health care provider. Make sure you discuss any questions you have with your health care provider. Document Revised: 09/26/2020 Document Reviewed: 09/26/2020 Elsevier Patient Education  2024 [employer].  

## 2023-07-10 NOTE — Progress Notes (Signed)
Subjective:  Patient ID: Russell [name], male    DOB: [birth year]  Age: 78 y.o. MRN: [id number]  CC: Hypertension and Hyperlipidemia   HPI Russell [name] presents for f/up -----   He exercises on an elliptical and has good endurance.  He has chronic fatigue but denies chest pain, shortness of breath, diaphoresis, or edema.  Outpatient Medications Prior to Visit  Medication Sig Dispense Refill   Ascorbic Acid (VITAMIN C PO) Take 500 mg by mouth daily.     aspirin EC 81 MG tablet Take 81 mg by mouth daily. Takes 3 times weekly     losartan (COZAAR) 100 MG tablet TAKE ONE TABLET DAILY 90 tablet 0   simvastatin (ZOCOR) 40 MG tablet TAKE ONE TABLET EVERY EVENING 90 tablet 0   indapamide (LOZOL) 1.25 MG tablet Take 1 tablet (1.25 mg total) by mouth daily. 90 tablet 1   No facility-administered medications prior to visit.    ROS Review of Systems  Constitutional:  Positive for fatigue. Negative for appetite change, diaphoresis and unexpected weight change.  HENT: Negative.    Eyes: Negative.   Respiratory:  Negative for cough, chest tightness, shortness of breath and wheezing.   Cardiovascular:  Negative for chest pain, palpitations and leg swelling.  Gastrointestinal:  Negative for abdominal pain, constipation, diarrhea, nausea and vomiting.  Endocrine: Negative.   Genitourinary: Negative.  Negative for difficulty urinating and dysuria.  Musculoskeletal:  Negative for arthralgias and myalgias.  Skin: Negative.   Neurological:  Positive for dizziness. Negative for weakness and light-headedness.  Hematological:  Negative for adenopathy. Does not bruise/bleed easily.    Objective:  BP 116/86 (BP Location: Left Arm, Patient Position: Sitting, Cuff Size: Normal)   Pulse (!) 53   Temp 97.6 F (36.4 C) (Oral)   Ht 5\' 11"  (1.803 m)   Wt 191 lb 9.6 oz (86.9 kg)   SpO2 95%   BMI 26.72 kg/m   BP Readings from Last 3 Encounters:  07/10/23 116/86  02/28/23 (!) 146/92  11/20/22 (!)  148/88    Wt Readings from Last 3 Encounters:  07/10/23 191 lb 9.6 oz (86.9 kg)  02/28/23 192 lb (87.1 kg)  11/20/22 189 lb (85.7 kg)    Physical Exam Vitals reviewed.  Constitutional:      Appearance: He is not ill-appearing.  HENT:     Mouth/Throat:     Mouth: Mucous membranes are moist.  Eyes:     General: No scleral icterus.    Conjunctiva/sclera: Conjunctivae normal.  Cardiovascular:     Rate and Rhythm: Regular rhythm. Bradycardia present.     Heart sounds: Normal heart sounds, S1 normal and S2 normal. No murmur heard.    No friction rub. No gallop.     Comments: EKG- SB, 53 bpm No LVH, Q waves, or ST/T wave changes  Pulmonary:     Effort: Pulmonary effort is normal.     Breath sounds: No stridor. No wheezing, rhonchi or rales.  Abdominal:     General: Abdomen is flat.     Palpations: There is no mass.     Tenderness: There is no abdominal tenderness. There is no guarding.     Hernia: No hernia is present.  Musculoskeletal:     Cervical back: Neck supple.     Right lower leg: No edema.     Left lower leg: No edema.  Lymphadenopathy:     Cervical: No cervical adenopathy.  Skin:    General: Skin is warm  and dry.  Neurological:     General: No focal deficit present.     Mental Status: He is alert. Mental status is at baseline.  Psychiatric:        Mood and Affect: Mood normal.        Behavior: Behavior normal.     Lab Results  Component Value Date   WBC 8.5 07/10/2023   HGB 15.8 07/10/2023   HCT 46.8 07/10/2023   PLT 228.0 07/10/2023   GLUCOSE 112 (H) 07/10/2023   CHOL 169 07/10/2023   TRIG 60.0 07/10/2023   HDL 73.00 07/10/2023   LDLDIRECT 153.3 05/31/2009   LDLCALC 84 07/10/2023   ALT 21 04/12/2022   AST 20 04/12/2022   NA 137 07/10/2023   K 4.3 07/10/2023   CL 102 07/10/2023   CREATININE 0.87 07/10/2023   BUN 17 07/10/2023   CO2 26 07/10/2023   TSH 0.91 11/20/2022   PSA 1.56 04/12/2022   INR 1.00 05/14/2013   HGBA1C 5.8 11/20/2022     CT CARDIAC SCORING ([facility] LOCATIONS ONLY) Result Date: 12/07/2022 CLINICAL DATA:  78 year old Caucasian male with history of chest pain. * Tracking Code: FCC * EXAM: CT CARDIAC CORONARY ARTERY CALCIUM SCORE TECHNIQUE: Non-contrast imaging through the heart was performed using prospective ECG gating. Image post processing was performed on an independent workstation, allowing for quantitative analysis of the heart and coronary arteries. Note that this exam targets the heart and the chest was not imaged in its entirety. COMPARISON:  No priors. FINDINGS: CORONARY CALCIUM SCORES: Left Main: 0 LAD: 6 LCx: 0 RCA: 110 Total Agatston Score: 116 MESA database percentile: 38th AORTA MEASUREMENTS: Ascending Aorta: 4.4 cm Descending Aorta:3.1 cm OTHER FINDINGS: Atherosclerotic calcifications in the thoracic aorta. Ectasia of the ascending thoracic aorta. Small pulmonary nodule in the periphery of the right middle lobe (axial image 30 of series 9) measuring 5 mm. Within the visualized portions of the thorax there are no other larger more suspicious appearing pulmonary nodules or masses, there is no acute consolidative airspace disease, no pleural effusions, no pneumothorax and no lymphadenopathy. Some thin-walled cysts are incidentally noted in the right lung. Visualized portions of the upper abdomen are unremarkable. There are no aggressive appearing lytic or blastic lesions noted in the visualized portions of the skeleton. IMPRESSION: 1. Patient's total coronary artery calcium score is 116 which is 38th percentile for patient's of matched age, gender and race/ethnicity. Please note that although the presence of coronary artery calcium documents the presence of coronary artery disease, the severity of this disease and any potential stenosis cannot be assessed on this noncontrast CT examination. Assessment for potential risk factor modification, dietary therapy or pharmacologic therapy may be warranted, if clinically  indicated. 2.  Aortic Atherosclerosis (ICD10-I70.0). 3. 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 Recommend annual imaging followup by CTA or MRA. This recommendation follows 2010 ACCF/AHA/AATS/ACR/ASA/SCA/SCAI/SIR/STS/SVM Guidelines for the Diagnosis and Management of Patients with Thoracic Aortic Disease. Circulation. 2010; 121: Z610-R604. Aortic aneurysm NOS (ICD10-I71.9). 4. 5 mm right middle lobe pulmonary nodule, nonspecific, but statistically likely benign. No follow-up needed if patient is low-risk.This recommendation follows the consensus statement: Guidelines for Management of Incidental Pulmonary Nodules Detected on CT Images: From the Fleischner Society 2017; Radiology 2017; 284:228-243. Electronically Signed   By: Trudie Reed M.D.   On: 12/07/2022 11:04    Assessment & Plan:   Hyperlipidemia LDL goal <70 - LDL goal achieved. Doing well on the statin  -     Lipid  panel; Future -     CK; Future  Primary hypertension- His blood pressure is overcontrolled.  Will discontinue the thiazide diuretic. -     Basic metabolic panel; Future -     CBC with Differential/Platelet; Future  Bradycardia- He is asymptomatic with this. -     EKG 12-Lead     Follow-up: Return in about 6 months (around 01/07/2024).  Sanda Linger, MD

## 2023-07-11 ENCOUNTER — Encounter: Payer: Self-pay | Admitting: Internal Medicine

## 2023-07-20 ENCOUNTER — Encounter: Payer: Self-pay | Admitting: Internal Medicine

## 2023-07-25 DIAGNOSIS — Z23 Encounter for immunization: Secondary | ICD-10-CM | POA: Diagnosis not present

## 2023-08-01 ENCOUNTER — Other Ambulatory Visit: Payer: Self-pay | Admitting: Internal Medicine

## 2023-08-01 DIAGNOSIS — I10 Essential (primary) hypertension: Secondary | ICD-10-CM

## 2023-08-01 DIAGNOSIS — N200 Calculus of kidney: Secondary | ICD-10-CM

## 2023-08-01 MED ORDER — INDAPAMIDE 1.25 MG PO TABS: 1.2500 mg | ORAL_TABLET | Freq: Every day | ORAL | 0 refills | Status: DC

## 2023-08-02 NOTE — Telephone Encounter (Signed)
Copied from CRM [id number]. Topic: Clinical - Medical Advice >> Aug 02, 2023 11:02 AM Fredrich Romans wrote: Reason for CRM: patient called in regarding mychart message that was sent over to him on 07/31/2023.Patient would like a response as soon as possible as to what he should do about his blood pressure

## 2023-08-11 ENCOUNTER — Other Ambulatory Visit: Payer: Self-pay | Admitting: Internal Medicine

## 2023-08-11 DIAGNOSIS — I10 Essential (primary) hypertension: Secondary | ICD-10-CM

## 2023-08-11 DIAGNOSIS — I517 Cardiomegaly: Secondary | ICD-10-CM

## 2023-08-15 DIAGNOSIS — Z85820 Personal history of malignant melanoma of skin: Secondary | ICD-10-CM | POA: Diagnosis not present

## 2023-08-15 DIAGNOSIS — L570 Actinic keratosis: Secondary | ICD-10-CM | POA: Diagnosis not present

## 2023-08-15 DIAGNOSIS — Z1283 Encounter for screening for malignant neoplasm of skin: Secondary | ICD-10-CM | POA: Diagnosis not present

## 2023-08-15 DIAGNOSIS — Z08 Encounter for follow-up examination after completed treatment for malignant neoplasm: Secondary | ICD-10-CM | POA: Diagnosis not present

## 2023-08-15 DIAGNOSIS — D225 Melanocytic nevi of trunk: Secondary | ICD-10-CM | POA: Diagnosis not present

## 2023-08-15 DIAGNOSIS — X32XXXD Exposure to sunlight, subsequent encounter: Secondary | ICD-10-CM | POA: Diagnosis not present

## 2023-08-15 DIAGNOSIS — C44519 Basal cell carcinoma of skin of other part of trunk: Secondary | ICD-10-CM | POA: Diagnosis not present

## 2023-09-20 DIAGNOSIS — H6123 Impacted cerumen, bilateral: Secondary | ICD-10-CM | POA: Diagnosis not present

## 2023-09-27 ENCOUNTER — Other Ambulatory Visit: Payer: Self-pay | Admitting: Internal Medicine

## 2023-09-27 DIAGNOSIS — E785 Hyperlipidemia, unspecified: Secondary | ICD-10-CM

## 2023-10-02 DIAGNOSIS — L570 Actinic keratosis: Secondary | ICD-10-CM | POA: Diagnosis not present

## 2023-10-02 DIAGNOSIS — Z85828 Personal history of other malignant neoplasm of skin: Secondary | ICD-10-CM | POA: Diagnosis not present

## 2023-10-02 DIAGNOSIS — X32XXXD Exposure to sunlight, subsequent encounter: Secondary | ICD-10-CM | POA: Diagnosis not present

## 2023-10-02 DIAGNOSIS — Z08 Encounter for follow-up examination after completed treatment for malignant neoplasm: Secondary | ICD-10-CM | POA: Diagnosis not present

## 2023-11-20 ENCOUNTER — Other Ambulatory Visit: Payer: Self-pay | Admitting: Internal Medicine

## 2023-11-20 DIAGNOSIS — I7121 Aneurysm of the ascending aorta, without rupture: Secondary | ICD-10-CM | POA: Insufficient documentation

## 2023-11-30 ENCOUNTER — Other Ambulatory Visit: Payer: Self-pay | Admitting: Internal Medicine

## 2023-12-13 ENCOUNTER — Ambulatory Visit
Admission: RE | Admit: 2023-12-13 | Discharge: 2023-12-13 | Disposition: A | Discharge status: Home / Self Care | Source: Ambulatory Visit | Attending: Internal Medicine | Admitting: Internal Medicine

## 2023-12-13 DIAGNOSIS — I700 Atherosclerosis of aorta: Secondary | ICD-10-CM | POA: Diagnosis not present

## 2023-12-13 DIAGNOSIS — I7121 Aneurysm of the ascending aorta, without rupture: Secondary | ICD-10-CM

## 2023-12-13 DIAGNOSIS — K8020 Calculus of gallbladder without cholecystitis without obstruction: Secondary | ICD-10-CM | POA: Diagnosis not present

## 2023-12-13 MED ORDER — IOPAMIDOL (ISOVUE-370) INJECTION 76%
75.0000 mL | Freq: Once | INTRAVENOUS | Status: AC | PRN
Start: 2023-12-13 — End: 2023-12-13
  Administered 2023-12-13: 75 mL via INTRAVENOUS

## 2023-12-14 ENCOUNTER — Ambulatory Visit: Payer: Self-pay | Admitting: Internal Medicine

## 2023-12-18 ENCOUNTER — Other Ambulatory Visit: Payer: Self-pay | Admitting: Internal Medicine

## 2023-12-18 DIAGNOSIS — N200 Calculus of kidney: Secondary | ICD-10-CM

## 2023-12-18 DIAGNOSIS — I10 Essential (primary) hypertension: Secondary | ICD-10-CM

## 2023-12-27 DIAGNOSIS — H40011 Open angle with borderline findings, low risk, right eye: Secondary | ICD-10-CM | POA: Diagnosis not present

## 2023-12-27 DIAGNOSIS — H40052 Ocular hypertension, left eye: Secondary | ICD-10-CM | POA: Diagnosis not present

## 2023-12-27 DIAGNOSIS — H35363 Drusen (degenerative) of macula, bilateral: Secondary | ICD-10-CM | POA: Diagnosis not present

## 2023-12-27 DIAGNOSIS — H04123 Dry eye syndrome of bilateral lacrimal glands: Secondary | ICD-10-CM | POA: Diagnosis not present

## 2023-12-28 ENCOUNTER — Other Ambulatory Visit: Payer: Self-pay | Admitting: Internal Medicine

## 2023-12-28 DIAGNOSIS — E785 Hyperlipidemia, unspecified: Secondary | ICD-10-CM

## 2024-01-07 ENCOUNTER — Ambulatory Visit (INDEPENDENT_AMBULATORY_CARE_PROVIDER_SITE_OTHER): Payer: BLUE CROSS/BLUE SHIELD | Admitting: Internal Medicine

## 2024-01-07 ENCOUNTER — Encounter: Payer: Self-pay | Admitting: Internal Medicine

## 2024-01-07 ENCOUNTER — Ambulatory Visit: Payer: Self-pay | Admitting: Internal Medicine

## 2024-01-07 VITALS — BP 116/82 | HR 56 | Temp 97.6°F | Resp 16 | Ht 71.0 in | Wt 191.2 lb

## 2024-01-07 DIAGNOSIS — R001 Bradycardia, unspecified: Secondary | ICD-10-CM | POA: Diagnosis not present

## 2024-01-07 DIAGNOSIS — E785 Hyperlipidemia, unspecified: Secondary | ICD-10-CM | POA: Diagnosis not present

## 2024-01-07 DIAGNOSIS — R7303 Prediabetes: Secondary | ICD-10-CM

## 2024-01-07 DIAGNOSIS — N401 Enlarged prostate with lower urinary tract symptoms: Secondary | ICD-10-CM | POA: Diagnosis not present

## 2024-01-07 DIAGNOSIS — I10 Essential (primary) hypertension: Secondary | ICD-10-CM | POA: Diagnosis not present

## 2024-01-07 DIAGNOSIS — R3912 Poor urinary stream: Secondary | ICD-10-CM | POA: Diagnosis not present

## 2024-01-07 DIAGNOSIS — N138 Other obstructive and reflux uropathy: Secondary | ICD-10-CM | POA: Diagnosis not present

## 2024-01-07 DIAGNOSIS — Z23 Encounter for immunization: Secondary | ICD-10-CM | POA: Insufficient documentation

## 2024-01-07 LAB — BASIC METABOLIC PANEL WITH GFR
BUN: 15 mg/dL (ref 6–23)
CO2: 27 meq/L (ref 19–32)
Calcium: 9.7 mg/dL (ref 8.4–10.5)
Chloride: 103 meq/L (ref 96–112)
Creatinine, Ser: 0.95 mg/dL (ref 0.40–1.50)
GFR: 78.07 mL/min (ref >60.00)
Glucose, Bld: 107 mg/dL — ABNORMAL HIGH (ref 70–99)
Potassium: 4.4 meq/L (ref 3.5–5.1)
Sodium: 138 meq/L (ref 135–145)

## 2024-01-07 LAB — CBC WITH DIFFERENTIAL/PLATELET
Basophils Absolute: 0 K/uL (ref 0.0–0.1)
Basophils Relative: 0.5 % (ref 0.0–3.0)
Eosinophils Absolute: 0.8 K/uL — ABNORMAL HIGH (ref 0.0–0.7)
Eosinophils Relative: 9 % — ABNORMAL HIGH (ref 0.0–5.0)
HCT: 44.9 % (ref 39.0–52.0)
Hemoglobin: 15.2 g/dL (ref 13.0–17.0)
Lymphocytes Relative: 27 % (ref 12.0–46.0)
Lymphs Abs: 2.3 K/uL (ref 0.7–4.0)
MCHC: 33.8 g/dL (ref 30.0–36.0)
MCV: 98.1 fl (ref 78.0–100.0)
Monocytes Absolute: 0.6 K/uL (ref 0.1–1.0)
Monocytes Relative: 6.8 % (ref 3.0–12.0)
Neutro Abs: 4.8 K/uL (ref 1.4–7.7)
Neutrophils Relative %: 56.7 % (ref 43.0–77.0)
Platelets: 212 K/uL (ref 150.0–400.0)
RBC: 4.57 Mil/uL (ref 4.22–5.81)
RDW: 12.9 % (ref 11.5–15.5)
WBC: 8.4 K/uL (ref 4.0–10.5)

## 2024-01-07 LAB — URINALYSIS, ROUTINE W REFLEX MICROSCOPIC
Bilirubin Urine: NEGATIVE
Hgb urine dipstick: NEGATIVE
Ketones, ur: NEGATIVE
Nitrite: NEGATIVE
RBC / HPF: NONE SEEN (ref 0–?)
Specific Gravity, Urine: 1.01 (ref 1.000–1.030)
Total Protein, Urine: NEGATIVE
Urine Glucose: NEGATIVE
Urobilinogen, UA: 0.2 (ref 0.0–1.0)
pH: 6 (ref 5.0–8.0)

## 2024-01-07 LAB — HEPATIC FUNCTION PANEL
ALT: 20 U/L (ref 0–53)
AST: 18 U/L (ref 0–37)
Albumin: 4.4 g/dL (ref 3.5–5.2)
Alkaline Phosphatase: 50 U/L (ref 39–117)
Bilirubin, Direct: 0.2 mg/dL (ref 0.0–0.3)
Total Bilirubin: 1 mg/dL (ref 0.2–1.2)
Total Protein: 6.6 g/dL (ref 6.0–8.3)

## 2024-01-07 LAB — PSA: PSA: 2.08 ng/mL (ref 0.10–4.00)

## 2024-01-07 LAB — TSH: TSH: 0.63 u[IU]/mL (ref 0.35–5.50)

## 2024-01-07 LAB — HEMOGLOBIN A1C: Hgb A1c MFr Bld: 5.9 % (ref 4.6–6.5)

## 2024-01-07 MED ORDER — BOOSTRIX 5-2.5-18.5 LF-MCG/0.5 IM SUSP
0.5000 mL | Freq: Once | INTRAMUSCULAR | 0 refills | Status: AC
Start: 2024-01-07 — End: 2024-01-07

## 2024-01-07 MED ORDER — TAMSULOSIN HCL 0.4 MG PO CAPS: 0.4000 mg | ORAL_CAPSULE | Freq: Every day | ORAL | 1 refills | Status: DC

## 2024-01-07 NOTE — Patient Instructions (Signed)
 Bradycardia, Adult Bradycardia is a slower-than-normal heartbeat. A normal resting heart rate for an adult ranges from 60 to 100 beats per minute. With bradycardia, the resting heart rate is less than 60 beats per minute. Bradycardia can prevent enough oxygen from reaching certain areas of your body when you are active. It can be serious if it keeps enough oxygen from reaching your brain and other parts of your body. Bradycardia is not a problem for everyone. For some healthy adults, a slow resting heart rate is normal. What are the causes? This condition may be caused by: A problem with the heart, including: A problem with the heart's electrical system, such as a heart block. With a heart block, electrical signals between the chambers of the heart are partially or completely blocked, so they are not able to work as they should. A problem with the heart's natural pacemaker (sinus node). Heart disease. A heart attack. Heart damage. Lyme disease. A heart infection. A heart condition that is present at birth (congenital heart defect). Certain medicines that treat heart conditions. Certain conditions, such as hypothyroidism and obstructive sleep apnea. Problems with the balance of chemicals and other substances, like potassium, in the blood. Trauma. Radiation therapy. What increases the risk? You are more likely to develop this condition if you: Are age 51 or older. Have high blood pressure (hypertension), high cholesterol (hyperlipidemia), or diabetes. Drink heavily, use tobacco or nicotine products, or use drugs. What are the signs or symptoms? Symptoms of this condition include: Light-headedness. Feeling faint or fainting. Fatigue and weakness. Trouble with activity or exercise. Shortness of breath. Chest pain (angina). Drowsiness. Confusion. Dizziness. How is this diagnosed? This condition may be diagnosed based on: Your symptoms. Your medical history. A physical exam. During  the exam, your health care provider will listen to your heartbeat and check your pulse. To confirm the diagnosis, your health care provider may order tests, such as: Blood tests. An electrocardiogram (ECG). This test records the heart's electrical activity. The test can show how fast your heart is beating and whether the heartbeat is steady. A test in which you wear a portable device (event recorder or Holter monitor) to record your heart's electrical activity while you go about your day. An exercise test. How is this treated? Treatment for this condition depends on the cause of the condition and how severe your symptoms are. Treatment may involve: Treatment of the underlying condition. Changing your medicines or how much medicine you take. Having a small, battery-operated device called a pacemaker implanted under the skin. When bradycardia occurs, this device can be used to increase your heart rate and help your heart beat in a regular rhythm. Follow these instructions at home: Lifestyle Manage any health conditions that contribute to bradycardia as told by your health care provider. Follow a heart-healthy diet. A nutrition specialist (dietitian) can help educate you about healthy food options and changes. Follow an exercise program that is approved by your health care provider. Maintain a healthy weight. Try to reduce or manage your stress, such as with yoga or meditation. If you need help reducing stress, ask your health care provider. Do not use any products that contain nicotine or tobacco. These products include cigarettes, chewing tobacco, and vaping devices, such as e-cigarettes. If you need help quitting, ask your health care provider. Do not use illegal drugs. Alcohol use If you drink alcohol: Limit how much you have to: 0-1 drink a day for women who are not pregnant. 0-2 drinks a day  for men. Know how much alcohol is in a drink. In the [place] one drink equals one 12 oz bottle of  beer (355 mL), one 5 oz glass of wine (148 mL), or one 1 oz glass of hard liquor (44 mL). General instructions Take over-the-counter and prescription medicines only as told by your health care provider. Keep all follow-up visits. This is important. How is this prevented? In some cases, bradycardia may be prevented by: Treating underlying medical problems. Stopping behaviors or medicines that can trigger the condition. Contact a health care provider if: You feel light-headed or dizzy. You almost faint. You feel weak or are easily fatigued during physical activity. You experience confusion or have memory problems. Get help right away if: You faint. You have chest pains or an irregular heartbeat (palpitations). You have trouble breathing. These symptoms may represent a serious problem that is an emergency. Do not wait to see if the symptoms will go away. Get medical help right away. Call your local emergency services (911 in the [place] Do not drive yourself to the hospital. Summary Bradycardia is a slower-than-normal heartbeat. With bradycardia, the resting heart rate is less than 60 beats per minute. Treatment for this condition depends on the cause. Manage any health conditions that contribute to bradycardia as told by your health care provider. Do not use any products that contain nicotine or tobacco. These products include cigarettes, chewing tobacco, and vaping devices, such as e-cigarettes. Keep all follow-up visits. This is important. This information is not intended to replace advice given to you by your health care provider. Make sure you discuss any questions you have with your health care provider. Document Revised: 09/26/2020 Document Reviewed: 09/26/2020 Elsevier Patient Education  2024 [employer].

## 2024-01-07 NOTE — Progress Notes (Signed)
 Subjective:  Patient ID: Russell [name], male    DOB: 1946/05/03  Age: 78 y.o. MRN: [id number]  CC: Hypertension and Hyperlipidemia   HPI [name] presents for f/up -----  Discussed the use of AI scribe software for clinical note transcription with the patient, who gave verbal consent to proceed.  History of Present Illness [name] is a 78 year old male with hypertension who presents with concerns about blood pressure management and occasional fatigue.  He experiences occasional fatigue in the afternoons, particularly when not engaged in stimulating activities at work. This fatigue does not occur daily and does not typically affect his focus or productivity. 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  He monitors his blood pressure at home using an [employer] device, though he is unsure of its accuracy. Recent readings include 105/78, 112/95, and 117/102. No symptoms such as dizziness, lightheadedness, or fainting, regardless of his blood pressure readings. He has reported use of Depakote, as discussed with his provider.  No chest pain, shortness of breath, or any symptoms of dizziness or lightheadedness. He maintains an active lifestyle, going to the gym four mornings a week for elliptical and upper body exercises. His heart rate is typically around 56, which he states is normal for him.   He complains of a weak urine stream.  Outpatient Medications Prior to Visit  Medication Sig Dispense Refill   Ascorbic Acid (VITAMIN C PO) Take 500 mg by mouth daily.     aspirin  EC 81 MG tablet Take 81 mg by mouth daily. Takes 3 times weekly     losartan  (COZAAR ) 100 MG tablet TAKE ONE TABLET DAILY 90 tablet 1   simvastatin  (ZOCOR ) 40 MG tablet TAKE ONE TABLET EVERY EVENING 90 tablet 0   indapamide  (LOZOL ) 1.25 MG tablet TAKE ONE TABLET DAILY 90 tablet 0   No facility-administered medications prior to  visit.    ROS Review of Systems  Constitutional:  Positive for fatigue. Negative for appetite change, chills, diaphoresis and fever.  HENT: Negative.    Eyes: Negative.   Respiratory:  Negative for cough, chest tightness, shortness of breath and wheezing.   Cardiovascular:  Negative for chest pain, palpitations and leg swelling.  Gastrointestinal:  Negative for abdominal pain, constipation, diarrhea, nausea and vomiting.  Genitourinary:  Positive for difficulty urinating. Negative for dysuria and hematuria.  Musculoskeletal: Negative.  Negative for arthralgias, joint swelling and myalgias.  Skin: Negative.   Neurological: Negative.  Negative for dizziness, weakness and light-headedness.  Hematological:  Negative for adenopathy. Does not bruise/bleed easily.  Psychiatric/Behavioral: Negative.      Objective:  BP 116/82 (BP Location: Left Arm, Patient Position: Sitting, Cuff Size: Small)   Pulse (!) 56   Temp 97.6 F (36.4 C) (Oral)   Resp 16   Ht 5' 11 (1.803 m)   Wt 191 lb 3.2 oz (86.7 kg)   SpO2 95%   BMI 26.67 kg/m   BP Readings from Last 3 Encounters:  01/07/24 116/82  07/10/23 116/86  02/28/23 (!) 146/92    Wt Readings from Last 3 Encounters:  01/07/24 191 lb 3.2 oz (86.7 kg)  07/10/23 191 lb 9.6 oz (86.9 kg)  02/28/23 192 lb (87.1 kg)    Physical Exam Vitals reviewed.  Constitutional:      Appearance: Normal appearance.  HENT:     Nose: Nose normal.  Eyes:     General: No scleral icterus.    Conjunctiva/sclera: Conjunctivae normal.  Cardiovascular:     Rate and Rhythm: Regular rhythm. Bradycardia present.     Heart sounds: No murmur heard.    No friction rub. No gallop.     Comments: EKG---- SB, 57 bpm No LVH, Q waves, or ST/T wave changes  Unchanged  Pulmonary:     Effort: Pulmonary effort is normal.     Breath sounds: No stridor. No wheezing, rhonchi or rales.  Abdominal:     General: Abdomen is flat.     Palpations: There is no mass.      Tenderness: There is no abdominal tenderness. There is no guarding.     Hernia: No hernia is present.  Musculoskeletal:        General: Normal range of motion.     Cervical back: Neck supple.     Right lower leg: No edema.     Left lower leg: No edema.  Lymphadenopathy:     Cervical: No cervical adenopathy.  Skin:    General: Skin is warm and dry.  Neurological:     General: No focal deficit present.     Mental Status: He is alert.  Psychiatric:        Mood and Affect: Mood normal.        Behavior: Behavior normal.     Lab Results  Component Value Date   WBC 8.4 01/07/2024   HGB 15.2 01/07/2024   HCT 44.9 01/07/2024   PLT 212.0 01/07/2024   GLUCOSE 107 (H) 01/07/2024   CHOL 169 07/10/2023   TRIG 60.0 07/10/2023   HDL 73.00 07/10/2023   LDLDIRECT 153.3 05/31/2009   LDLCALC 84 07/10/2023   ALT 20 01/07/2024   AST 18 01/07/2024   NA 138 01/07/2024   K 4.4 01/07/2024   CL 103 01/07/2024   CREATININE 0.95 01/07/2024   BUN 15 01/07/2024   CO2 27 01/07/2024   TSH 0.63 01/07/2024   PSA 2.08 01/07/2024   INR 1.00 05/14/2013   HGBA1C 5.9 01/07/2024    CT ANGIO CHEST AORTA W/CM & OR WO/CM Result Date: 12/14/2023 CLINICAL DATA:  Ascending aortic aneurysm EXAM: CT ANGIOGRAPHY CHEST WITH CONTRAST TECHNIQUE: Multidetector CT imaging of the chest was performed using the standard protocol during bolus administration of intravenous contrast. Multiplanar CT image reconstructions and MIPs were obtained to evaluate the vascular anatomy. RADIATION DOSE REDUCTION: This exam was performed according to the departmental dose-optimization program which includes automated exposure control, adjustment of the mA and/or kV according to patient size and/or use of iterative reconstruction technique. CONTRAST:  75mL ISOVUE -370 IOPAMIDOL  (ISOVUE -370) INJECTION 76% COMPARISON:  Cardiac CT scan 12/07/2022 FINDINGS: Cardiovascular: 2 vessel arch anatomy. The right brachiocephalic and left common carotid  artery share a common origin. Mild scattered atherosclerotic plaque along the thoracic aorta. The aortic root is within normal limits at 4.1 cm measured at the sinuses of Valsalva. No effacement of the sino-tubular junction. The ascending thoracic aorta is normal in caliber with a maximal diameter of 3.8 cm. Prior measurement of 4.4 cm was measured obliquely and exaggerated the aortic diameter. The heart is normal in size. No pericardial effusion. Unremarkable appearance of the pulmonary artery. Mediastinum/Nodes: Unremarkable CT appearance of the thyroid  gland. No suspicious mediastinal or hilar adenopathy. No soft tissue mediastinal mass. The thoracic esophagus is unremarkable. Lungs/Pleura: No emphysematous changes. There are a few isolated pulmonary cysts. Mild dependent atelectasis. Tiny calcified granuloma in the subpleural space  along the margin of the right middle lobe. No imaging follow-up is recommended. No suspicious pulmonary mass or nodule. Upper Abdomen: No acute abnormality within the upper abdomen. Multiple calcified gallstones noted in the gallbladder lumen. Large periampullary duodenal diverticulum noted incidentally. Musculoskeletal: No acute fracture or aggressive appearing lytic or blastic osseous lesion. Review of the MIP images confirms the above findings. IMPRESSION: 1. Negative for thoracic aortic aneurysm. Maximal aortic diameter measured perpendicular to the long axis of the tubular portion of the ascending thoracic aorta is only 3.8 cm. Prior measurement of 4.4 cm was measured obliquely and the underlying aortic tortuosity exaggerated the diameter. 2. Mild aortic atherosclerotic vascular calcifications. 3. Cholelithiasis. 4. Additional ancillary findings as above. Aortic Atherosclerosis (ICD10-I70.0). Signed, Wilkie LOIS Lent, MD, RPVI [facility] Radiology Electronically Signed   By: Wilkie Lent M.D.   On: 12/14/2023 06:41     Assessment & Plan:  Primary hypertension- His BP is over-controlled. Will discontinue the thiazide diuretic. EKG is negative for LVH. -     Basic metabolic panel with GFR; Future -     CBC with Differential/Platelet; Future -     Hepatic function panel; Future -     TSH; Future -     Urinalysis, Routine w reflex microscopic; Future -     AMB Referral VBCI Care Management  Hyperlipidemia LDL goal <70 -     Hepatic function panel; Future -     TSH; Future  Need for prophylactic vaccination with combined diphtheria-tetanus-pertussis (DTP) vaccine -     Boostrix ; Inject 0.5 mLs into the muscle once for 1 dose.  Dispense: 0.5 mL; Refill: 0  Bradycardia -     EKG 12-Lead  Prediabetes -     Hemoglobin A1c; Future  BPH with obstruction/lower urinary tract symptoms -     PSA; Future -     Urinalysis, Routine w reflex microscopic; Future  Benign prostatic hyperplasia with weak urinary stream -     Tamsulosin  HCl; Take 1 capsule (0.4 mg total) by mouth daily after supper.  Dispense: 90 capsule; Refill: 1 -     AMB Referral VBCI Care Management     Follow-up: Return in about 6 months (around 07/09/2024).  Debby Molt, MD

## 2024-01-16 ENCOUNTER — Telehealth: Payer: Self-pay | Admitting: *Deleted

## 2024-01-16 NOTE — Progress Notes (Signed)
 Care Guide Pharmacy Note  01/16/2024 Name: Russell [name] MRN: [id number] DOB: [birth year]  Referred By: Joshua Debby CROME, MD Reason for referral: Complex Care Management (Outreach to schedule referral with pharmacist )   Russell [name] is a 78 y.o. year old male who is a primary care patient of Joshua Debby CROME, MD.  Russell [name] was referred to the pharmacist for assistance related to: HTN  Successful contact was made with the patient to discuss pharmacy services including being ready for the pharmacist to call at least 5 minutes before the scheduled appointment time and to have medication bottles and any blood pressure readings ready for review. The patient agreed to meet with the pharmacist via telephone visit on 01/29/2024  Russell [name], CMA [place]  [facility] Guide Direct Dial: [phone]  Fax: [phone] Website: [email or web address]

## 2024-01-25 DIAGNOSIS — D044 Carcinoma in situ of skin of scalp and neck: Secondary | ICD-10-CM | POA: Diagnosis not present

## 2024-01-25 DIAGNOSIS — Z08 Encounter for follow-up examination after completed treatment for malignant neoplasm: Secondary | ICD-10-CM | POA: Diagnosis not present

## 2024-01-25 DIAGNOSIS — X32XXXD Exposure to sunlight, subsequent encounter: Secondary | ICD-10-CM | POA: Diagnosis not present

## 2024-01-25 DIAGNOSIS — D225 Melanocytic nevi of trunk: Secondary | ICD-10-CM | POA: Diagnosis not present

## 2024-01-25 DIAGNOSIS — Z85820 Personal history of malignant melanoma of skin: Secondary | ICD-10-CM | POA: Diagnosis not present

## 2024-01-25 DIAGNOSIS — Z1283 Encounter for screening for malignant neoplasm of skin: Secondary | ICD-10-CM | POA: Diagnosis not present

## 2024-01-25 DIAGNOSIS — L570 Actinic keratosis: Secondary | ICD-10-CM | POA: Diagnosis not present

## 2024-01-25 DIAGNOSIS — L820 Inflamed seborrheic keratosis: Secondary | ICD-10-CM | POA: Diagnosis not present

## 2024-01-29 ENCOUNTER — Other Ambulatory Visit

## 2024-02-04 ENCOUNTER — Other Ambulatory Visit: Payer: Self-pay | Admitting: Internal Medicine

## 2024-02-04 ENCOUNTER — Encounter: Payer: Self-pay | Admitting: Internal Medicine

## 2024-02-04 DIAGNOSIS — I517 Cardiomegaly: Secondary | ICD-10-CM

## 2024-02-04 DIAGNOSIS — I10 Essential (primary) hypertension: Secondary | ICD-10-CM

## 2024-02-15 ENCOUNTER — Other Ambulatory Visit: Admitting: Pharmacist

## 2024-02-15 DIAGNOSIS — I10 Essential (primary) hypertension: Secondary | ICD-10-CM

## 2024-02-15 NOTE — Patient Instructions (Signed)
 It was a pleasure speaking with you today!  -Recommend to check home blood pressure and heart rate about 3x per week - Recommend to continue losartan  and indapamide  - Contact us  if he is having BP <100/70 often and/or symptoms of low blood pressure  Feel free to call with any questions or concerns!  Darrelyn Drum, PharmD, BCPS, CPP Clinical Pharmacist Practitioner [facility] [phone]

## 2024-02-15 NOTE — Progress Notes (Signed)
 02/15/2024 Name: Russell [name] MRN: [id number] DOB: May 30, 1946  Chief Complaint  Patient presents with   Hypertension   Medication Management    RAMI BUDHU is a 78 y.o. year old male who presented for a telephone visit.   They were referred to the pharmacist by their PCP for assistance in managing hypertension.     Subjective:  Care Team: Primary Care Provider: Joshua Debby CROME, MD ; Next Scheduled Visit: 07/09/24   Medication Access/Adherence  Current Pharmacy:  [facility] - [place], [place] - [address] [place] [address] Phone: [phone] Fax: [phone]   Patient reports affordability concerns with their medications: No  Patient reports access/transportation concerns to their pharmacy: No  Patient reports adherence concerns with their medications:  No     Hypertension:  Current medications: losartan  100 mg daily, indapamide  1.25 mg daily  *Indapamide  was d/c due to concern for hypotension, however pt reported some ankle swelling and elevated BP after stopping so indapamide  was then restarted  Patient has a validated, automated, upper arm home BP cuff Current blood pressure readings readings: 8/19 148/80 8/23 8 AM 92/69, 30 min later 101/70 8/24 123/80 noon  Patient denies hypotensive s/sx including dizziness, lightheadedness.  Patient denies hypertensive symptoms including headache, chest pain, shortness of breath    Objective:  Lab Results  Component Value Date   HGBA1C 5.9 01/07/2024    Lab Results  Component Value Date   CREATININE 0.95 01/07/2024   BUN 15 01/07/2024   NA 138 01/07/2024   K 4.4 01/07/2024   CL 103 01/07/2024   CO2 27 01/07/2024    Lab Results  Component Value Date   CHOL 169 07/10/2023   HDL 73.00 07/10/2023   LDLCALC 84 07/10/2023   LDLDIRECT 153.3 05/31/2009   TRIG 60.0 07/10/2023   CHOLHDL 2 07/10/2023    Medications Reviewed Today     Reviewed by Merceda Lela SAUNDERS,  RPH (Pharmacist) on 02/15/24 at 1645  Med List Status: <None>   Medication Order Taking? Sig Documenting Provider Last Dose Status Informant  Ascorbic Acid (VITAMIN C PO) [id number]  Take 500 mg by mouth daily. [provider]  Active   aspirin  EC 81 MG tablet [id number]  Take 81 mg by mouth daily. Takes 3 times weekly [provider]  Active   indapamide  (LOZOL ) 1.25 MG tablet [id number] Yes Take 1.25 mg by mouth daily. Joshua Debby CROME, MD  Active   losartan  (COZAAR ) 100 MG tablet [id number] Yes TAKE ONE TABLET DAILY Joshua Debby CROME, MD  Active   simvastatin  (ZOCOR ) 40 MG tablet [id number] Yes TAKE ONE TABLET EVERY KARNA Joshua Debby CROME, MD  Active   tamsulosin  (FLOMAX ) 0.4 MG CAPS capsule [id number] Yes Take 1 capsule (0.4 mg total) by mouth daily after supper. Joshua Debby CROME, MD  Active               Assessment/Plan:   Hypertension: - Currently controlled, BP goal <130/80 - Reviewed appropriate blood pressure monitoring technique and reviewed goal blood pressure. Recommended to check home blood pressure and heart rate about 3x per week - Recommend to continue losartan  and indapamide  - Advised pt to contact us  if he is having BP <100/70 often and/or symptoms of low blood pressure. May need to reduce losartan  if experiencing hypotension     Follow Up Plan: PRN  Darrelyn Merceda, PharmD, BCPS, CPP Clinical Pharmacist Practitioner [place] [facility] [phone]

## 2024-02-18 ENCOUNTER — Encounter: Payer: Self-pay | Admitting: Internal Medicine

## 2024-02-22 DIAGNOSIS — Z85828 Personal history of other malignant neoplasm of skin: Secondary | ICD-10-CM | POA: Diagnosis not present

## 2024-02-22 DIAGNOSIS — C4441 Basal cell carcinoma of skin of scalp and neck: Secondary | ICD-10-CM | POA: Diagnosis not present

## 2024-02-22 DIAGNOSIS — Z85820 Personal history of malignant melanoma of skin: Secondary | ICD-10-CM | POA: Diagnosis not present

## 2024-02-22 DIAGNOSIS — Z08 Encounter for follow-up examination after completed treatment for malignant neoplasm: Secondary | ICD-10-CM | POA: Diagnosis not present

## 2024-03-24 ENCOUNTER — Other Ambulatory Visit: Payer: Self-pay | Admitting: Internal Medicine

## 2024-03-24 DIAGNOSIS — E785 Hyperlipidemia, unspecified: Secondary | ICD-10-CM

## 2024-04-09 ENCOUNTER — Other Ambulatory Visit (INDEPENDENT_AMBULATORY_CARE_PROVIDER_SITE_OTHER): Payer: Self-pay

## 2024-04-09 ENCOUNTER — Ambulatory Visit (INDEPENDENT_AMBULATORY_CARE_PROVIDER_SITE_OTHER): Payer: Self-pay | Admitting: Orthopaedic Surgery

## 2024-04-09 ENCOUNTER — Encounter: Payer: Self-pay | Admitting: Orthopaedic Surgery

## 2024-04-09 DIAGNOSIS — M1711 Unilateral primary osteoarthritis, right knee: Secondary | ICD-10-CM | POA: Insufficient documentation

## 2024-04-09 DIAGNOSIS — M25361 Other instability, right knee: Secondary | ICD-10-CM | POA: Diagnosis not present

## 2024-04-09 DIAGNOSIS — B029 Zoster without complications: Secondary | ICD-10-CM | POA: Diagnosis not present

## 2024-04-09 DIAGNOSIS — M25561 Pain in right knee: Secondary | ICD-10-CM

## 2024-04-09 MED ORDER — LIDOCAINE HCL 1 % IJ SOLN
3.0000 mL | INTRAMUSCULAR | Status: AC | PRN
  Administered 2024-04-09: 3 mL

## 2024-04-09 MED ORDER — METHYLPREDNISOLONE ACETATE 40 MG/ML IJ SUSP
40.0000 mg | INTRAMUSCULAR | Status: AC | PRN
  Administered 2024-04-09: 40 mg via INTRA_ARTICULAR

## 2024-04-09 NOTE — Progress Notes (Signed)
 The patient is a 78 year old gentleman I am seeing for the first time.  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  Over the last month he has developed some worsening right knee pain and some weakness in the knee does give out on occasion with no known injury.  He is very active and even no recent hiking trip did not bother his knee.  He does go to the gym on a daily basis and works on quad strengthening exercises.  When he first gets up in the morning his knee is very stiff.  Again at times he feels like it is going to give out.  He is not on blood thinning medication.  On exam he is not obese individual.  His left knee has neutral alignment and improves great he has a well-healed surgical incision as well.  It is stable on exam.  His right knee which is more painful has a slight effusion.  There is a valgus malalignment that knee with patellofemoral crepitation and lateral joint line tenderness as well as pain throughout the arc of motion of the right knee.  X-rays of the right knee shows tricompartment arthritis with valgus malalignment and osteophytes in all 3 compartments.  The patellofemoral narrowing and lateral narrowing is significant.  We talked about treatment modalities for arthritis of his right knee.  He will continue quad strengthening exercises and I did recommend a steroid injection today.  He may even consider a copper fit knee sleeve.  He tolerated the steroid injection well and knows to wait at least 3 months between steroid injections at a minimum.  If he gets to the point where his pain is daily and is bothering him enough he can consider knee replacement surgery as well.  All question concerns were addressed and answered.    Procedure Note  Patient: Russell [name]             Date of Birth: December 08, 1945           MRN: [id number]             Visit Date: 04/09/2024  Procedures: Visit Diagnoses:  1.  Acute pain of right knee   2. Instability of right knee joint   3. Unilateral primary osteoarthritis, right knee     Large Joint Inj: R knee on 04/09/2024 9:33 AM Indications: diagnostic evaluation and pain Details: 22 G 1.5 in needle, superolateral approach  Arthrogram: No  Medications: 3 mL lidocaine  1 %; 40 mg methylPREDNISolone  acetate 40 MG/ML Outcome: tolerated well, no immediate complications Procedure, treatment alternatives, risks and benefits explained, specific risks discussed. Consent was given by the patient. Immediately prior to procedure a time out was called to verify the correct patient, procedure, equipment, support staff and site/side marked as required. Patient was prepped and draped in the usual sterile fashion.

## 2024-04-18 DIAGNOSIS — Z23 Encounter for immunization: Secondary | ICD-10-CM | POA: Diagnosis not present

## 2024-04-21 ENCOUNTER — Encounter: Payer: Self-pay | Admitting: Radiology

## 2024-05-08 HISTORY — PX: OTHER SURGICAL HISTORY: SHX169

## 2024-05-09 ENCOUNTER — Ambulatory Visit (INDEPENDENT_AMBULATORY_CARE_PROVIDER_SITE_OTHER): Payer: BLUE CROSS/BLUE SHIELD

## 2024-05-09 VITALS — Ht 71.0 in | Wt 191.0 lb

## 2024-05-09 DIAGNOSIS — Z0000 Encounter for general adult medical examination without abnormal findings: Secondary | ICD-10-CM

## 2024-05-09 NOTE — Progress Notes (Signed)
 Chief Complaint  Patient presents with   Medicare Wellness     Subjective:   Russell [name] is a 78 y.o. male who presents for a Medicare Annual Wellness Visit.  Allergies (verified) Patient has no known allergies.   History: Past Medical History:  Diagnosis Date   Acute prostatitis    Arthritis    hands   Cancer (HCC)    basal cell removed, melanoma October 2022   Dislocated patella 04/2013   S/P   ARTHROPLASTY   Hemorrhoids    History of skin cancer    Hx of colonic polyps    Hypertension    Kidney stone    passed stone - no surgery required   Other and unspecified hyperlipidemia    Smoker    Whole blood donor    Past Surgical History:  Procedure Laterality Date   CATARACT EXTRACTION Right 01/16/2023   COLONOSCOPY     eye lid lift surgery Bilateral 05/08/2024   EYE SURGERY     for scratced cornea   KNEE ARTHROPLASTY Left 04/30/2013   Procedure: COMPUTER ASSISTED TOTAL KNEE ARTHROPLASTY;  Surgeon: Oneil JAYSON Herald, MD;  Location: [facility] OR;  Service: Orthopedics;  Laterality: Left;  Left Cemented Total Knee Arthroplasty   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 Fernando at [facility]   left knee arthroscopy  03/2007   MOHS SURGERY     November 21,2022- lower back   PATELLAR TENDON REPAIR Left 05/14/2013   Procedure: Left Total Knee Arthroplasty Medial Retinaculum Repair;  Surgeon: Oneil JAYSON Herald, MD;  Location: [facility] OR;  Service: Orthopedics;  Laterality: Left;  Left Total Knee Arthroplasty Medial Retinaculum Repair   POLYPECTOMY     skin cancer removed Left 2016   shoulder    skin cancer removed N/A 02/22/2016   from top of his head.  this was done at [facility]   TONSILLECTOMY AND ADENOIDECTOMY     Family History  Problem Relation Age of Onset   Colon cancer Maternal Grandfather    Esophageal cancer Neg Hx    Rectal cancer Neg Hx    Stomach cancer Neg Hx    Sleep apnea Neg Hx    Social History   Occupational History   Occupation: Medical Laboratory Scientific Officer: [employer]  Tobacco Use   Smoking status: Light Smoker    Types: Cigars   Smokeless tobacco: Never   Tobacco comments:    cigars once weekly  Vaping Use   Vaping status: Never Used  Substance and Sexual Activity   Alcohol use: Yes    Alcohol/week: 21.0 standard drinks of alcohol    Types: 14 Glasses of wine, 7 Cans of beer per week    Comment: daily use   Drug use: No   Sexual activity: Not on file   Tobacco Counseling Ready to quit: Not Answered Counseling given: Not Answered Tobacco comments: cigars once weekly  SDOH Screenings   Food Insecurity: No Food Insecurity (05/09/2024)  Housing: Unknown (05/09/2024)  Transportation Needs: No Transportation Needs (05/09/2024)  Utilities: Not At Risk (05/09/2024)  Alcohol Screen: Low Risk  (01/04/2024)  Depression (PHQ2-9): Low Risk  (05/09/2024)  Financial Resource Strain: Low Risk  (01/04/2024)  Physical Activity: Sufficiently Active (05/09/2024)  Social Connections: Moderately Integrated (05/09/2024)  Stress: No Stress Concern Present (05/09/2024)  Tobacco Use: High Risk (05/09/2024)  Health Literacy: Adequate Health Literacy (05/09/2024)   See flowsheets for full screening  details  Depression Screen PHQ 2 & 9 Depression Scale- Over the past 2 weeks, how often have you been bothered by any of the following problems? Little interest or pleasure in doing things: 0 Feeling down, depressed, or hopeless (PHQ Adolescent also includes...irritable): 0 PHQ-2 Total Score: 0 Trouble falling or staying asleep, or sleeping too much: 0 Feeling tired or having little energy: 1 Poor appetite or overeating (PHQ Adolescent also includes...weight loss): 0 Feeling bad about yourself - or that you are a failure or have let yourself or your family down: 0 Trouble concentrating on things, such as reading the newspaper or watching television (PHQ Adolescent also includes...like school work): 0 Moving or speaking so slowly  that other people could have noticed. Or the opposite - being so fidgety or restless that you have been moving around a lot more than usual: 0 Thoughts that you would be better off dead, or of hurting yourself in some way: 0 PHQ-9 Total Score: 1 If you checked off any problems, how difficult have these problems made it for you to do your work, take care of things at home, or get along with other people?: Not difficult at all  Depression Treatment Depression Interventions/Treatment : EYV7-0 Score <4 Follow-up Not Indicated     Goals Addressed   None    Visit info / Clinical Intake: Medicare Wellness Visit Type:: Subsequent Annual Wellness Visit Persons participating in visit:: patient Medicare Wellness Visit Mode:: Video Because this visit was a virtual/telehealth visit:: vitals recorded from last visit If Telephone or Video please confirm:: I connected with the patient using audio enabled telemedicine application and verified that I am speaking with the correct person using two identifiers; I discussed the limitations of evaluation and management by telemedicine; The patient expressed understanding and agreed to proceed Patient Location:: Work Provider Location:: Home Information given by:: patient Interpreter Needed?: No Pre-visit prep was completed: no AWV questionnaire completed by patient prior to visit?: no Living arrangements:: lives with spouse/significant other Patient's Overall Health Status Rating: very good Typical amount of pain: none Does pain affect daily life?: no Are you currently prescribed opioids?: no  Dietary Habits and Nutritional Risks How many meals a day?: 3 Eats fruit and vegetables daily?: (!) no Most meals are obtained by: preparing own meals; eating out In the last 2 weeks, have you had any of the following?: none Diabetic:: no  Functional Status Activities of Daily Living (to include ambulation/medication): Independent Ambulation:  Independent Medication Administration: Independent Home Management: Independent Manage your own finances?: yes Primary transportation is: driving Concerns about vision?: no *vision screening is required for WTM* Concerns about hearing?: (!) yes Uses hearing aids?: (!) yes Hear whispered voice?: yes  Fall Screening Falls in the past year?: 0 Number of falls in past year: 0 Was there an injury with Fall?: 0 Fall Risk Category Calculator: 0 Patient Fall Risk Level: Low Fall Risk  Fall Risk Patient at Risk for Falls Due to: No Fall Risks Fall risk Follow up: Falls evaluation completed; Falls prevention discussed  Home and Transportation Safety: All rugs have non-skid backing?: N/A, no rugs All stairs or steps have railings?: N/A, no stairs Grab bars in the bathtub or shower?: (!) no Have non-skid surface in bathtub or shower?: yes Good home lighting?: yes Regular seat belt use?: yes Hospital stays in the last year:: no  Cognitive Assessment Difficulty concentrating, remembering, or making decisions? : no Will 6CIT or Mini Cog be Completed: no 6CIT or Mini Cog Declined:  patient alert, oriented, able to answer questions appropriately and recall recent events  Advance Directives (For Healthcare) Does Patient Have a Medical Advance Directive?: Yes Type of Advance Directive: Living will  Reviewed/Updated  Reviewed/Updated: Reviewed All (Medical, Surgical, Family, Medications, Allergies, Care Teams, Patient Goals)        Objective:    Today's Vitals   05/09/24 0850  Weight: 191 lb (86.6 kg)  Height: 5' 11 (1.803 m)   Body mass index is 26.64 kg/m.  Current Medications (verified) Outpatient Encounter Medications as of 05/09/2024  Medication Sig   Ascorbic Acid (VITAMIN C PO) Take 500 mg by mouth daily.   aspirin  EC 81 MG tablet Take 81 mg by mouth daily. Takes 3 times weekly   indapamide  (LOZOL ) 1.25 MG tablet Take 1.25 mg by mouth daily.   losartan  (COZAAR ) 100 MG  tablet TAKE ONE TABLET DAILY   simvastatin  (ZOCOR ) 40 MG tablet TAKE ONE TABLET EVERY EVENING   tamsulosin  (FLOMAX ) 0.4 MG CAPS capsule Take 1 capsule (0.4 mg total) by mouth daily after supper.   No facility-administered encounter medications on file as of 05/09/2024.   Hearing/Vision screen Hearing Screening - Comments:: Wears hearing aids Vision Screening - Comments:: Had cataract surgery/[place] [facility]/UTD Immunizations and Health Maintenance Health Maintenance  Topic Date Due   DTaP/Tdap/Td (2 - Td or Tdap) 06/20/2023   Influenza Vaccine  01/18/2024   COVID-19 Vaccine (6 - 2025-26 season) 02/18/2024   Medicare Annual Wellness (AWV)  05/03/2024   Colonoscopy  06/28/2026   Pneumococcal Vaccine: 50+ Years  Completed   Hepatitis C Screening  Completed   Zoster Vaccines- Shingrix   Completed   Meningococcal B Vaccine  Aged Out        Assessment/Plan:  This is a routine wellness examination for Kasson.  Patient Care Team: Joshua Debby CROME, MD as PCP - General (Internal Medicine) Madelyn Deanne BRAVO, OD as Consulting Physician (Optometry)  I have personally reviewed and noted the following in the patient's chart:   Medical and social history Use of alcohol, tobacco or illicit drugs  Current medications and supplements including opioid prescriptions. Functional ability and status Nutritional status Physical activity Advanced directives List of other physicians Hospitalizations, surgeries, and ER visits in previous 12 months Vitals Screenings to include cognitive, depression, and falls Referrals and appointments  No orders of the defined types were placed in this encounter.  In addition, I have reviewed and discussed with patient certain preventive protocols, quality metrics, and best practice recommendations. A written personalized care plan for preventive services as well as general preventive health recommendations were provided to patient.   Khi Mcmillen L Ethan Kasperski, CMA   05/09/2024    No follow-ups on file.  After Visit Summary: (MyChart) Due to this being a telephonic visit, the after visit summary with patients personalized plan was offered to patient via MyChart   Nurse Notes: Patient stated that he has received a flu, vaccine and a Tdap vaccine on 04/17/2024 at [facility].  No documentation in [facility] as of yet.  He had no other concerns to address today.

## 2024-05-09 NOTE — Patient Instructions (Signed)
 Russell [name],  Thank you for taking the time for your Medicare Wellness Visit. I appreciate your continued commitment to your health goals. Please review the care plan we discussed, and feel free to reach out if I can assist you further.  Please note that Annual Wellness Visits do not include a physical exam. Some assessments may be limited, especially if the visit was conducted virtually. If needed, we may recommend an in-person follow-up with your provider.  Ongoing Care Seeing your primary care provider every 3 to 6 months helps us  monitor your health and provide consistent, personalized care. Next office visit on 07/10/2023.  Keep up the good work.  Referrals If a referral was made during today's visit and you haven't received any updates within two weeks, please contact the referred provider directly to check on the status.  Recommended Screenings:  Health Maintenance  Topic Date Due   DTaP/Tdap/Td vaccine (2 - Td or Tdap) 06/20/2023   Flu Shot  01/18/2024   COVID-19 Vaccine (6 - 2025-26 season) 02/18/2024   Medicare Annual Wellness Visit  05/09/2025   Colon Cancer Screening  06/28/2026   Pneumococcal Vaccine for age over 108  Completed   Hepatitis C Screening  Completed   Zoster (Shingles) Vaccine  Completed   Meningitis B Vaccine  Aged Out       05/09/2024    8:52 AM  Advanced Directives  Does Patient Have a Medical Advance Directive? Yes  Type of Advance Directive Living will    Vision: Annual vision screenings are recommended for early detection of glaucoma, cataracts, and diabetic retinopathy. These exams can also reveal signs of chronic conditions such as diabetes and high blood pressure.  Dental: Annual dental screenings help detect early signs of oral cancer, gum disease, and other conditions linked to overall health, including heart disease and diabetes.  Please see the attached documents for additional preventive care recommendations.

## 2024-07-09 ENCOUNTER — Ambulatory Visit: Admitting: Internal Medicine

## 2024-07-09 ENCOUNTER — Ambulatory Visit: Payer: Self-pay | Admitting: Internal Medicine

## 2024-07-09 ENCOUNTER — Encounter: Payer: Self-pay | Admitting: Internal Medicine

## 2024-07-09 VITALS — BP 134/78 | HR 52 | Temp 97.5°F | Resp 16 | Ht 71.0 in | Wt 192.8 lb

## 2024-07-09 DIAGNOSIS — N138 Other obstructive and reflux uropathy: Secondary | ICD-10-CM | POA: Diagnosis not present

## 2024-07-09 DIAGNOSIS — K625 Hemorrhage of anus and rectum: Secondary | ICD-10-CM | POA: Diagnosis not present

## 2024-07-09 DIAGNOSIS — E785 Hyperlipidemia, unspecified: Secondary | ICD-10-CM

## 2024-07-09 DIAGNOSIS — R7303 Prediabetes: Secondary | ICD-10-CM

## 2024-07-09 DIAGNOSIS — N401 Enlarged prostate with lower urinary tract symptoms: Secondary | ICD-10-CM | POA: Diagnosis not present

## 2024-07-09 DIAGNOSIS — I517 Cardiomegaly: Secondary | ICD-10-CM

## 2024-07-09 DIAGNOSIS — R001 Bradycardia, unspecified: Secondary | ICD-10-CM

## 2024-07-09 DIAGNOSIS — I10 Essential (primary) hypertension: Secondary | ICD-10-CM

## 2024-07-09 LAB — CBC WITH DIFFERENTIAL/PLATELET
Basophils Absolute: 0 K/uL (ref 0.0–0.1)
Basophils Relative: 0.6 % (ref 0.0–3.0)
Eosinophils Absolute: 0.5 K/uL (ref 0.0–0.7)
Eosinophils Relative: 5.9 % — ABNORMAL HIGH (ref 0.0–5.0)
HCT: 44.1 % (ref 39.0–52.0)
Hemoglobin: 15.1 g/dL (ref 13.0–17.0)
Lymphocytes Relative: 28.5 % (ref 12.0–46.0)
Lymphs Abs: 2.3 K/uL (ref 0.7–4.0)
MCHC: 34.1 g/dL (ref 30.0–36.0)
MCV: 98 fl (ref 78.0–100.0)
Monocytes Absolute: 0.5 K/uL (ref 0.1–1.0)
Monocytes Relative: 6.5 % (ref 3.0–12.0)
Neutro Abs: 4.6 K/uL (ref 1.4–7.7)
Neutrophils Relative %: 58.5 % (ref 43.0–77.0)
Platelets: 183 K/uL (ref 150.0–400.0)
RBC: 4.5 Mil/uL (ref 4.22–5.81)
RDW: 13.4 % (ref 11.5–15.5)
WBC: 7.9 K/uL (ref 4.0–10.5)

## 2024-07-09 LAB — BASIC METABOLIC PANEL WITH GFR
BUN: 14 mg/dL (ref 6–23)
CO2: 27 meq/L (ref 19–32)
Calcium: 9.5 mg/dL (ref 8.4–10.5)
Chloride: 108 meq/L (ref 96–112)
Creatinine, Ser: 0.81 mg/dL (ref 0.40–1.50)
GFR: 85.7 mL/min
Glucose, Bld: 101 mg/dL — ABNORMAL HIGH (ref 70–99)
Potassium: 4.3 meq/L (ref 3.5–5.1)
Sodium: 143 meq/L (ref 135–145)

## 2024-07-09 LAB — LIPID PANEL
Cholesterol: 137 mg/dL (ref 28–200)
HDL: 65.3 mg/dL
LDL Cholesterol: 64 mg/dL (ref 10–99)
NonHDL: 71.98
Total CHOL/HDL Ratio: 2
Triglycerides: 40 mg/dL (ref 10.0–149.0)
VLDL: 8 mg/dL (ref 0.0–40.0)

## 2024-07-09 LAB — URINALYSIS, ROUTINE W REFLEX MICROSCOPIC
Bilirubin Urine: NEGATIVE
Hgb urine dipstick: NEGATIVE
Ketones, ur: NEGATIVE
Leukocytes,Ua: NEGATIVE
Nitrite: NEGATIVE
RBC / HPF: NONE SEEN
Specific Gravity, Urine: 1.01 (ref 1.000–1.030)
Total Protein, Urine: NEGATIVE
Urine Glucose: NEGATIVE
Urobilinogen, UA: 0.2 (ref 0.0–1.0)
pH: 6 (ref 5.0–8.0)

## 2024-07-09 LAB — HEPATIC FUNCTION PANEL
ALT: 15 U/L (ref 3–53)
AST: 18 U/L (ref 5–37)
Albumin: 4.4 g/dL (ref 3.5–5.2)
Alkaline Phosphatase: 58 U/L (ref 39–117)
Bilirubin, Direct: 0.2 mg/dL (ref 0.1–0.3)
Total Bilirubin: 0.9 mg/dL (ref 0.2–1.2)
Total Protein: 6.7 g/dL (ref 6.0–8.3)

## 2024-07-09 LAB — HEMOGLOBIN A1C: Hgb A1c MFr Bld: 5.8 % (ref 4.6–6.5)

## 2024-07-09 LAB — PSA: PSA: 2.2 ng/mL (ref 0.10–4.00)

## 2024-07-09 LAB — TSH: TSH: 0.87 u[IU]/mL (ref 0.35–5.50)

## 2024-07-09 NOTE — Patient Instructions (Signed)
 Bradycardia, Adult Bradycardia is a slower-than-normal heartbeat. A normal resting heart rate for an adult ranges from 60 to 100 beats per minute. With bradycardia, the resting heart rate is less than 60 beats per minute. Bradycardia can prevent enough oxygen  from reaching certain areas of your body when you are active. It can be serious if it keeps enough oxygen  from reaching your brain and other parts of your body. Bradycardia is not a problem for everyone. For some healthy adults, a slow resting heart rate is normal. What are the causes? This condition may be caused by: A problem with the heart, including: A problem with the heart's electrical system, such as a heart block. With a heart block, electrical signals between the chambers of the heart are partially or completely blocked, so they are not able to work as they should. A problem with the heart's natural pacemaker (sinus node). Heart disease. A heart attack. Heart damage. Lyme disease. A heart infection. A heart condition that is present at birth (congenital heart defect). Certain medicines that treat heart conditions. Certain conditions, such as hypothyroidism and obstructive sleep apnea. Problems with the balance of chemicals and other substances, like potassium, in the blood. Trauma. Radiation therapy. What increases the risk? You are more likely to develop this condition if you: Are age 30 or older. Have high blood pressure (hypertension), high cholesterol (hyperlipidemia), or diabetes. Drink heavily, use tobacco or nicotine products, or use drugs. What are the signs or symptoms? Symptoms of this condition include: Light-headedness. Feeling faint or fainting. Fatigue and weakness. Trouble with activity or exercise. Shortness of breath. Chest pain (angina). Drowsiness. Confusion. Dizziness. How is this diagnosed? This condition may be diagnosed based on: Your symptoms. Your medical history. A physical exam. During  the exam, your health care provider will listen to your heartbeat and check your pulse. To confirm the diagnosis, your health care provider may order tests, such as: Blood tests. An electrocardiogram (ECG). This test records the heart's electrical activity. The test can show how fast your heart is beating and whether the heartbeat is steady. A test in which you wear a portable device (event recorder or Holter monitor) to record your heart's electrical activity while you go about your day. An exercise test. How is this treated? Treatment for this condition depends on the cause of the condition and how severe your symptoms are. Treatment may involve: Treatment of the underlying condition. Changing your medicines or how much medicine you take. Having a small, battery-operated device called a pacemaker implanted under the skin. When bradycardia occurs, this device can be used to increase your heart rate and help your heart beat in a regular rhythm. Follow these instructions at home: Lifestyle Manage any health conditions that contribute to bradycardia as told by your health care provider. Follow a heart-healthy diet. A nutrition specialist (dietitian) can help educate you about healthy food options and changes. Follow an exercise program that is approved by your health care provider. Maintain a healthy weight. Try to reduce or manage your stress, such as with yoga or meditation. If you need help reducing stress, ask your health care provider. Do not use any products that contain nicotine or tobacco. These products include cigarettes, chewing tobacco, and vaping devices, such as e-cigarettes. If you need help quitting, ask your health care provider. Do not use illegal drugs. Alcohol  use If you drink alcohol : Limit how much you have to: 0-1 drink a day for women who are not pregnant. 0-2 drinks a day  for men. Know how much alcohol  is in a drink. In the [place] one drink equals one 12 oz bottle of  beer (355 mL), one 5 oz glass of wine (148 mL), or one 1 oz glass of hard liquor (44 mL). General instructions Take over-the-counter and prescription medicines only as told by your health care provider. Keep all follow-up visits. This is important. How is this prevented? In some cases, bradycardia may be prevented by: Treating underlying medical problems. Stopping behaviors or medicines that can trigger the condition. Contact a health care provider if: You feel light-headed or dizzy. You almost faint. You feel weak or are easily fatigued during physical activity. You experience confusion or have memory problems. Get help right away if: You faint. You have chest pains or an irregular heartbeat (palpitations). You have trouble breathing. These symptoms may represent a serious problem that is an emergency. Do not wait to see if the symptoms will go away. Get medical help right away. Call your local emergency services (911 in the [place] Do not drive yourself to the hospital. Summary Bradycardia is a slower-than-normal heartbeat. With bradycardia, the resting heart rate is less than 60 beats per minute. Treatment for this condition depends on the cause. Manage any health conditions that contribute to bradycardia as told by your health care provider. Do not use any products that contain nicotine or tobacco. These products include cigarettes, chewing tobacco, and vaping devices, such as e-cigarettes. Keep all follow-up visits. This is important. This information is not intended to replace advice given to you by your health care provider. Make sure you discuss any questions you have with your health care provider. Document Revised: 09/26/2020 Document Reviewed: 09/26/2020 Elsevier Patient Education  2024 [employer].

## 2024-07-09 NOTE — Progress Notes (Unsigned)
 "  Subjective:  Patient ID: Russell [name], male    DOB: 11/06/1945  Age: 79 y.o. MRN: [id number]  CC: Hypertension (6 month follow up )   HPI Russell [name] presents for f/up ----  Discussed the use of AI scribe software for clinical note transcription with the patient, who gave verbal consent to proceed.  History of Present Illness Russell [name] is a 79 year old male who presents with concerns about blood pressure management and rectal bleeding.  He has experienced fluctuations in blood pressure readings. He discontinued indapamide  in August due to low readings, such as 92/60 and 97/67. After stopping the medication, his blood pressure increased, with some readings as high as 168/98. Recently, his blood pressure has stabilized, with readings like 123/86 and 133/86. No dizziness, lightheadedness, chest pain, or shortness of breath associated with these fluctuations. He reports slight faintness with low blood pressure readings in the past.  He experiences rectal bleeding during bowel movements and reports that doctors in the past have told him it was due to hemorrhoids. The bleeding occurs primarily during the first wipe and is not associated with pain. He describes needing to clean multiple times and sometimes uses tissue to prevent staining his clothes. He has tried fiber supplements in the past without noticeable improvement.  He discusses urinary symptoms, including a weak stream unless he has a strong urge to urinate. He has been taking Flomax  but has not noticed significant improvement. He is at the end of his prescription and is considering other treatment options.  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     Outpatient Medications Prior to Visit  Medication Sig Dispense Refill   Ascorbic Acid (VITAMIN C PO) Take 500 mg by mouth daily.     aspirin  EC 81 MG tablet Take 81 mg by mouth daily. Takes 3 times weekly     losartan   (COZAAR ) 100 MG tablet TAKE ONE TABLET DAILY 90 tablet 1   simvastatin  (ZOCOR ) 40 MG tablet TAKE ONE TABLET EVERY EVENING 90 tablet 1   tamsulosin  (FLOMAX ) 0.4 MG CAPS capsule Take 1 capsule (0.4 mg total) by mouth daily after supper. 90 capsule 1   indapamide  (LOZOL ) 1.25 MG tablet Take 1.25 mg by mouth daily. (Patient not taking: Reported on 07/09/2024)     No facility-administered medications prior to visit.    ROS Review of Systems  Objective:  BP 134/78 (BP Location: Left Arm, Patient Position: Sitting, Cuff Size: Normal)   Pulse (!) 52   Temp (!) 97.5 F (36.4 C) (Oral)   Resp 16   Ht 5' 11 (1.803 m)   Wt 192 lb 12.8 oz (87.5 kg)   SpO2 97%   BMI 26.89 kg/m   BP Readings from Last 3 Encounters:  07/09/24 134/78  01/07/24 116/82  07/10/23 116/86    Wt Readings from Last 3 Encounters:  07/09/24 192 lb 12.8 oz (87.5 kg)  05/09/24 191 lb (86.6 kg)  01/07/24 191 lb 3.2 oz (86.7 kg)    Physical Exam Vitals reviewed.  Constitutional:      Appearance: Normal appearance.  Cardiovascular:     Rate and Rhythm: Bradycardia present.     Comments: EKG--- SB, 50 bpm Minimal LVH No Q waves Unchanged  Genitourinary:    Comments: GU/rectal deferred at his request Musculoskeletal:     Right lower leg: No edema.     Left lower leg: No  edema.  Neurological:     Mental Status: He is alert.     Lab Results  Component Value Date   WBC 7.9 07/09/2024   HGB 15.1 07/09/2024   HCT 44.1 07/09/2024   PLT 183.0 07/09/2024   GLUCOSE 101 (H) 07/09/2024   CHOL 137 07/09/2024   TRIG 40.0 07/09/2024   HDL 65.30 07/09/2024   LDLDIRECT 153.3 05/31/2009   LDLCALC 64 07/09/2024   ALT 15 07/09/2024   AST 18 07/09/2024   NA 143 07/09/2024   K 4.3 07/09/2024   CL 108 07/09/2024   CREATININE 0.81 07/09/2024   BUN 14 07/09/2024   CO2 27 07/09/2024   TSH 0.87 07/09/2024   PSA 2.20 07/09/2024   INR 1.00 05/14/2013   HGBA1C 5.8 07/09/2024    CT ANGIO CHEST AORTA W/CM & OR  WO/CM Result Date: 12/14/2023 CLINICAL DATA:  Ascending aortic aneurysm EXAM: CT ANGIOGRAPHY CHEST WITH CONTRAST TECHNIQUE: Multidetector CT imaging of the chest was performed using the standard protocol during bolus administration of intravenous contrast. Multiplanar CT image reconstructions and MIPs were obtained to evaluate the vascular anatomy. RADIATION DOSE REDUCTION: This exam was performed according to the departmental dose-optimization program which includes automated exposure control, adjustment of the mA and/or kV according to patient size and/or use of iterative reconstruction technique. CONTRAST:  75mL ISOVUE -370 IOPAMIDOL  (ISOVUE -370) INJECTION 76% COMPARISON:  Cardiac CT scan 12/07/2022 FINDINGS: Cardiovascular: 2 vessel arch anatomy. The right brachiocephalic and left common carotid artery share a common origin. Mild scattered atherosclerotic plaque along the thoracic aorta. The aortic root is within normal limits at 4.1 cm measured at the sinuses of Valsalva. No effacement of the sino-tubular junction. The ascending thoracic aorta is normal in caliber with a maximal diameter of 3.8 cm. Prior measurement of 4.4 cm was measured obliquely and exaggerated the aortic diameter. The heart is normal in size. No pericardial effusion. Unremarkable appearance of the pulmonary artery. Mediastinum/Nodes: Unremarkable CT appearance of the thyroid  gland. No suspicious mediastinal or hilar adenopathy. No soft tissue mediastinal mass. The thoracic esophagus is unremarkable. Lungs/Pleura: No emphysematous changes. There are a few isolated pulmonary cysts. Mild dependent atelectasis. Tiny calcified granuloma in the subpleural space along the margin of the right middle lobe. No imaging follow-up is recommended. No suspicious pulmonary mass or nodule. Upper Abdomen: No acute abnormality within the upper abdomen. Multiple calcified gallstones noted in the gallbladder lumen. Large periampullary duodenal diverticulum  noted incidentally. Musculoskeletal: No acute fracture or aggressive appearing lytic or blastic osseous lesion. Review of the MIP images confirms the above findings. IMPRESSION: 1. Negative for thoracic aortic aneurysm. Maximal aortic diameter measured perpendicular to the long axis of the tubular portion of the ascending thoracic aorta is only 3.8 cm. Prior measurement of 4.4 cm was measured obliquely and the underlying aortic tortuosity exaggerated the diameter. 2. Mild aortic atherosclerotic vascular calcifications. 3. Cholelithiasis. 4. Additional ancillary findings as above. Aortic Atherosclerosis (ICD10-I70.0). Signed, Wilkie LOIS Lent, MD, RPVI [facility] Radiology Electronically Signed   By: Wilkie Lent M.D.   On: 12/14/2023 06:41    Assessment & Plan:  Primary hypertension -     Basic metabolic panel with GFR; Future -     CBC with Differential/Platelet; Future -     Hepatic function panel; Future -     TSH; Future -     Urinalysis, Routine w reflex microscopic; Future -     EKG 12-Lead  Prediabetes -     Basic  metabolic panel with GFR; Future -     Hemoglobin A1c; Future  Bradycardia -     TSH; Future  Hyperlipidemia LDL goal <70 -     Lipid panel; Future -     Hepatic function panel; Future -     TSH; Future  BPH with obstruction/lower urinary tract symptoms -     Ambulatory referral to Urology -     PSA; Future -     Urinalysis, Routine w reflex microscopic; Future  BRBPR (bright red blood per rectum) -     Ambulatory referral to Gastroenterology     Follow-up: Return in about 6 months (around 01/06/2025).  Debby Molt, MD "

## 2024-07-11 MED ORDER — LOSARTAN POTASSIUM 100 MG PO TABS: 100.0000 mg | ORAL_TABLET | Freq: Every day | ORAL | 1 refills | Status: AC

## 2024-08-13 ENCOUNTER — Ambulatory Visit: Admitting: Urology
